# Patient Record
Sex: Female | Born: 1960 | Race: White | Hispanic: No | Marital: Married | Smoking: Never smoker
Health system: Southern US, Community
[De-identification: ages and names within clinical notes are randomized; demographics above are authoritative.]

## PROBLEM LIST (undated history)

## (undated) DIAGNOSIS — I82409 Acute embolism and thrombosis of unspecified deep veins of unspecified lower extremity: Secondary | ICD-10-CM

## (undated) DIAGNOSIS — F329 Major depressive disorder, single episode, unspecified: Secondary | ICD-10-CM

## (undated) DIAGNOSIS — E039 Hypothyroidism, unspecified: Secondary | ICD-10-CM

## (undated) DIAGNOSIS — K219 Gastro-esophageal reflux disease without esophagitis: Secondary | ICD-10-CM

## (undated) DIAGNOSIS — K760 Fatty (change of) liver, not elsewhere classified: Secondary | ICD-10-CM

## (undated) DIAGNOSIS — G4730 Sleep apnea, unspecified: Secondary | ICD-10-CM

## (undated) DIAGNOSIS — G629 Polyneuropathy, unspecified: Secondary | ICD-10-CM

## (undated) DIAGNOSIS — E119 Type 2 diabetes mellitus without complications: Secondary | ICD-10-CM

## (undated) DIAGNOSIS — D649 Anemia, unspecified: Secondary | ICD-10-CM

## (undated) DIAGNOSIS — R0600 Dyspnea, unspecified: Secondary | ICD-10-CM

## (undated) DIAGNOSIS — J189 Pneumonia, unspecified organism: Secondary | ICD-10-CM

## (undated) DIAGNOSIS — S52501A Unspecified fracture of the lower end of right radius, initial encounter for closed fracture: Secondary | ICD-10-CM

## (undated) DIAGNOSIS — E7800 Pure hypercholesterolemia, unspecified: Secondary | ICD-10-CM

## (undated) DIAGNOSIS — R011 Cardiac murmur, unspecified: Secondary | ICD-10-CM

## (undated) DIAGNOSIS — Z9889 Other specified postprocedural states: Secondary | ICD-10-CM

## (undated) DIAGNOSIS — F32A Depression, unspecified: Secondary | ICD-10-CM

## (undated) DIAGNOSIS — N289 Disorder of kidney and ureter, unspecified: Secondary | ICD-10-CM

## (undated) DIAGNOSIS — I10 Essential (primary) hypertension: Secondary | ICD-10-CM

## (undated) DIAGNOSIS — R112 Nausea with vomiting, unspecified: Secondary | ICD-10-CM

## (undated) DIAGNOSIS — M1990 Unspecified osteoarthritis, unspecified site: Secondary | ICD-10-CM

## (undated) DIAGNOSIS — K5900 Constipation, unspecified: Secondary | ICD-10-CM

## (undated) HISTORY — PX: KIDNEY TRANSPLANT: SHX239

## (undated) HISTORY — PX: ABDOMINAL HYSTERECTOMY: SHX81

## (undated) HISTORY — PX: COLONOSCOPY: SHX174

## (undated) HISTORY — PX: EYE SURGERY: SHX253

---

## 1997-11-23 ENCOUNTER — Ambulatory Visit (HOSPITAL_COMMUNITY): Admission: RE | Admit: 1997-11-23 | Discharge: 1997-11-23 | Payer: Self-pay | Admitting: Unknown Physician Specialty

## 1998-02-24 HISTORY — PX: KIDNEY TRANSPLANT: SHX239

## 1998-02-24 HISTORY — PX: CHOLECYSTECTOMY: SHX55

## 1998-04-27 ENCOUNTER — Encounter: Payer: Self-pay | Admitting: General Surgery

## 1998-04-28 ENCOUNTER — Inpatient Hospital Stay (HOSPITAL_COMMUNITY): Admission: RE | Admit: 1998-04-28 | Discharge: 1998-04-29 | Payer: Self-pay | Admitting: General Surgery

## 2001-11-02 ENCOUNTER — Encounter: Payer: Self-pay | Admitting: Nephrology

## 2001-11-02 ENCOUNTER — Encounter: Admission: RE | Admit: 2001-11-02 | Discharge: 2001-11-02 | Payer: Self-pay | Admitting: Nephrology

## 2005-04-25 ENCOUNTER — Ambulatory Visit (HOSPITAL_BASED_OUTPATIENT_CLINIC_OR_DEPARTMENT_OTHER): Admission: RE | Admit: 2005-04-25 | Discharge: 2005-04-25 | Payer: Self-pay | Admitting: Nephrology

## 2005-04-25 ENCOUNTER — Encounter: Payer: Self-pay | Admitting: Internal Medicine

## 2005-04-27 ENCOUNTER — Ambulatory Visit: Payer: Self-pay | Admitting: Nephrology

## 2005-06-24 ENCOUNTER — Ambulatory Visit: Payer: Self-pay | Admitting: Internal Medicine

## 2005-07-24 ENCOUNTER — Ambulatory Visit: Payer: Self-pay | Admitting: Internal Medicine

## 2005-12-18 ENCOUNTER — Ambulatory Visit: Payer: Self-pay | Admitting: Internal Medicine

## 2006-06-19 ENCOUNTER — Ambulatory Visit: Payer: Self-pay | Admitting: Internal Medicine

## 2007-12-22 ENCOUNTER — Telehealth (INDEPENDENT_AMBULATORY_CARE_PROVIDER_SITE_OTHER): Payer: Self-pay | Admitting: *Deleted

## 2008-05-29 DIAGNOSIS — G4733 Obstructive sleep apnea (adult) (pediatric): Secondary | ICD-10-CM

## 2008-05-29 DIAGNOSIS — Z940 Kidney transplant status: Secondary | ICD-10-CM

## 2008-05-30 ENCOUNTER — Ambulatory Visit: Payer: Self-pay | Admitting: Internal Medicine

## 2008-07-05 ENCOUNTER — Telehealth (INDEPENDENT_AMBULATORY_CARE_PROVIDER_SITE_OTHER): Payer: Self-pay | Admitting: *Deleted

## 2008-07-26 ENCOUNTER — Encounter: Payer: Self-pay | Admitting: Internal Medicine

## 2008-08-15 ENCOUNTER — Ambulatory Visit: Payer: Self-pay | Admitting: Internal Medicine

## 2008-09-03 ENCOUNTER — Telehealth: Payer: Self-pay | Admitting: Internal Medicine

## 2008-09-22 ENCOUNTER — Ambulatory Visit: Payer: Self-pay | Admitting: Internal Medicine

## 2008-10-10 ENCOUNTER — Encounter: Payer: Self-pay | Admitting: Internal Medicine

## 2008-11-13 ENCOUNTER — Encounter: Payer: Self-pay | Admitting: Internal Medicine

## 2009-04-18 ENCOUNTER — Ambulatory Visit: Payer: Self-pay | Admitting: Internal Medicine

## 2009-04-18 LAB — CONVERTED CEMR LAB: IgE (Immunoglobulin E), Serum: <1.5 intl units/mL (ref 0.0–180.0)

## 2009-05-18 ENCOUNTER — Ambulatory Visit: Payer: Self-pay | Admitting: Internal Medicine

## 2009-08-16 ENCOUNTER — Ambulatory Visit: Payer: Self-pay | Admitting: Internal Medicine

## 2009-08-29 ENCOUNTER — Telehealth (INDEPENDENT_AMBULATORY_CARE_PROVIDER_SITE_OTHER): Payer: Self-pay | Admitting: *Deleted

## 2010-02-13 ENCOUNTER — Ambulatory Visit: Payer: Self-pay | Admitting: Internal Medicine

## 2010-02-21 ENCOUNTER — Telehealth (INDEPENDENT_AMBULATORY_CARE_PROVIDER_SITE_OTHER): Payer: Self-pay | Admitting: *Deleted

## 2010-03-26 NOTE — Assessment & Plan Note (Signed)
Summary: 3 months/apc   Primary Provider/Referring Provider:  Roney Marion  CC:  3 month follow up visit-allergies; itchy ears x 6-7 weeks.Valerie Perez  History of Present Illness: April 18, 2009- OSA, allergic conjunctivitis, hx kidney transplant Says eye and nose are worse if anything. Some blood and watery drainage, especially in AMs. Ears pop. Hearing not changed. Itching but little sneeze. Not seasonal. CPAP is "fine' and she doesn't feel it bothers her nose. Pressure is now 12 and she likes this DME better. She hadn't been using the humidifier on her cpap. Says she still feels somewhat tired or no energy.  May 18, 2009- OSA, allergic rhinoconjunctivitis, hx kidney transplant Likes Astepro in addition to eye drops and loratadine. Some nausea today attributed to her transplant meds, but breathing has been good, with minor watering of eyes.  Sleep- says she is sleeping ok but complains of not much energy. Good compiance and control with CPAP at 12 used every night.   August 16, 2009- OSA, allergic rhinoconjunctivitis, hx kidney transplant C/O itching in eras, itching watery nose x 6-7 weeks. Uses Astepro, loratadine, [place] eyedrops. Chest feels fine. Continues immunosuppression for transplant with Rapamune and prednisone. Contnues cpap all night every night and says it helps.. used Nuvigil 1/4 tab if needed.   Preventive Screening-Counseling & Management  Alcohol-Tobacco     Smoking Status: never     Tobacco Counseling: not indicated; no tobacco use  Current Medications (verified): 1)  Rapamune 1 Mg Tabs (Sirolimus) .... 5 Once Daily 2)  Prednisone 5 Mg Tabs (Prednisone) .... Take As Directed 3)  Zetia 10 Mg Tabs (Ezetimibe) .Valerie Perez.. 1 Once Daily 4)  Actos 30 Mg Tabs (Pioglitazone Hcl) .Valerie Perez.. 1 Once Daily 5)  Lipitor 40 Mg Tabs (Atorvastatin Calcium) .Valerie Perez.. 1 Once Daily 6)  Paroxetine Hcl 20 Mg Tabs (Paroxetine Hcl) .Valerie Perez.. 1 Once Daily 7)  Colchicine 0.6 Mg Tabs (Colchicine) .Valerie Perez.. 1 Once  Daily 8)  Allopurinol 300 Mg Tabs (Allopurinol) .... Take 1 By Mouth Once Daily 9)  Loratadine 10 Mg Tabs (Loratadine) .... Take 1 By Mouth Once Daily As Needed 10)  Eye Drops Relief 0.05-0.25 % Soln (Tetrahydrozoline-Zn Sulfate) .... Use As Directed 11)  Cpap 12 [facility] Pt 12)  Fluticasone Propionate 50 Mcg/act Susp (Fluticasone Propionate) .Valerie Perez.. 1-2 Puffs Each Nostril Once Daily 13)  Nuvigil 150 Mg Tabs (Armodafinil) .Valerie Perez.. 1 Daily As Needed 14)  Astepro 0.15 % Soln (Azelastine Hcl) .Valerie Perez.. 1-2 Puffs Each Nostil Up To Two Times A Day As Needed  Allergies (verified): 1)  ! Jonne Ply  Past History:  Past Surgical History: Last updated: 06-01-2008 Renal transplant [facility] Cholecystectomy abd hernia repair heel spurs right foot  Family History: Last updated: 01-Jun-2008 Father- died thyroid cancer Mother- died colon cancer  Social History: Last updated: 06-01-2008 Patient states former smoker. 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, engineering  Risk Factors: Smoking Status: never (08/16/2009)  Past Medical History: UNSPECIFIED CONJUNCTIVITIS (ICD-372.30) KIDNEY TRANSPLANTATION, HX OF (ICD-V42.0) HYPERSOMNIA (ICD-780.54) OBSTRUCTIVE SLEEP APNEA (ICD-327.23)  Social History: Smoking Status:  never  Review of Systems      See HPI  The patient denies shortness of breath with activity, shortness of breath at rest, productive cough, non-productive cough, coughing up blood, chest pain, irregular heartbeats, acid heartburn, indigestion, loss of appetite, weight change, abdominal pain, difficulty swallowing, sore throat, tooth/dental problems, headaches, nasal congestion/difficulty breathing through nose, and sneezing.    Vital Signs:  Patient profile:  50 year old female Height:      65.5 inches Weight:      230 pounds BMI:     37.83 O2 Sat:      99 % on Room air Pulse rate:   75 / minute BP sitting:   126 / 80  (left arm) Cuff size:    regular  Vitals Entered By: Reynaldo Minium CMA (August 16, 2009 10:53 AM)  O2 Flow:  Room air CC: 3 month follow up visit-allergies; itchy ears x 6-7 weeks. Comments BP taken on wrist with regular cuff.Reynaldo Minium CMA  August 16, 2009 10:53 AM    Physical Exam  Additional Exam:  General: A/Ox3; pleasant and cooperative, NAD, overweight, calm/ passive SKIN: no rash, lesions NODES: no lymphadenopathy HEENT: [place]/AT, EOM- WNL, canals may show mnimal trace eczema, Conjuctivae- clear, PERRLA, TM-WNL, Nose- watery clear mucus. The mucosa shows superficial anterior bleeding without deep erosion, Throat- clear and wnl, Mallampatii III NECK: Supple w/ fair ROM, JVD- none, normal carotid impulses w/o bruits Thyroid- normal to palpation CHEST: Clear to P&A HEART: RRR, no m/g/r heard ABDOMEN:  YQI:HKVQ, nl pulses, no edema  NEURO: Grossly intact to observation      Impression & Recommendations:  Problem # 1:  ALLERGIC RHINITIS (ICD-477.9)  Control is good in her nose. For allergy or eczema related itching in auditory canals, she will try sweet oil. We will give sample patanase to compare with Astepro.  Her updated medication list for this problem includes:    Loratadine 10 Mg Tabs (Loratadine) .Valerie Perez... Take 1 by mouth once daily as needed    Fluticasone Propionate 50 Mcg/act Susp (Fluticasone propionate) .Valerie Perez... 1-2 puffs each nostril once daily    Astepro 0.15 % Soln (Azelastine hcl) .Valerie Perez... 1-2 puffs each nostil up to two times a day as needed  Problem # 2:  OBSTRUCTIVE SLEEP APNEA (ICD-327.23)  Continues good compiance and control with CPAP. Weight loss would help.  Problem # 3:  UNSPECIFIED CONJUNCTIVITIS (ICD-372.30) Inflammation is not evident ofn exam. We discussed possible over drying.  Other Orders: Est. Patient Level III (25956)  Patient Instructions: 1)  Please schedule a follow-up appointment in 6 months- earlier if needed 2)  Try a little sweet oil when ears itch. 3)  Try sample  Patanase : 1-2 puffs into each nostril twice daily as needed. Compare this to Astepro as an alternative. See if either one makes any difference with the itching in your ears.

## 2010-03-26 NOTE — Progress Notes (Signed)
Summary: rx  Phone Note Call from Patient Call back at Home Phone [phone]   Caller: Patient Call For: young Reason for Call: Talk to Nurse Summary of Call: [facility] - [place] Please call in script for nasal spray - Tatanase Initial call taken by: Eugene Gavia,  August 29, 2009 4:01 PM  Follow-up for Phone Call        nasal spray sent to [facility] in  Follow-up by: Philipp Deputy CMA,  August 29, 2009 4:08 PM    New/Updated Medications: PATANASE 0.6 % SOLN (OLOPATADINE HCL) 1-2 puffs into each nostril twice a day as needed Prescriptions: PATANASE 0.6 % SOLN (OLOPATADINE HCL) 1-2 puffs into each nostril twice a day as needed  #1 x 3   Entered by:   Philipp Deputy CMA   Authorized by:   Waymon Budge MD   Signed by:   Philipp Deputy CMA on 08/29/2009   Method used:   Electronically to        [facility]. [phone]* (retail)       [address]       [place], [place]  [address]       Ph: [phone] or [phone]       Fax: [phone]   RxID:   [id number]

## 2010-03-26 NOTE — Assessment & Plan Note (Signed)
Summary: 1 month/apc   Primary Provider/Referring Provider:  Roney Marion  CC:  1 month follow up visit-Astepro really helps. .  History of Present Illness: 08/15/08- OSA, allergic conjunctivitis With CPAP notes she sleeps all night, but feels persistent tiredness- less than before, and has more dream recall. Naps help. Avoids cafeine. HS 11PM, up 8-9 AM. Sleeps through the night.  Rhinitis- likes veramyst sample along with her loratadine. We can give script for generic.  09/21/08- OSA, allergic conjunctivitis, hx Kidney transplant Eyes and nasal drainage worse this summer, even indoors. She is using fluticasone, lotratadine, Relief eyedrops, and remains on prednisone for her renal transplant. OSA- DRS never changed her cpap down from 14 to 12 and they apparently are too far away from her. Mask discomfort may improve.  April 18, 2009- OSA, allergic conjunctivitis, hx kidney transplant Says eye and nose are worse if anything. Some blood and watery drainage, especially in AMs. Ears pop. Hearing not changed. Itching but little sneeze. Not seasonal. CPAP is "fine' and she doesn't feel it bothers her nose. Pressure is now 12 and she likes this DME better. She hadn't been using the humidifier on her cpap. Says she still feels somewhat tired or no energy.  May 18, 2009- OSA, allergic rhinoconjunctivitis, hx kidney transplant Likes Astepro in addition to eye drops and loratadine. Some nausea today attributed to her transplant meds, but breathing has been good, with minor watering of eyes.  Sleep- says she is sleeping ok but complains of not much energy. Good compiance and control with CPAP at 12 used every night.   Current Medications (verified): 1)  Rapamune 1 Mg Tabs (Sirolimus) .... 5 Once Daily 2)  Prednisone 5 Mg Tabs (Prednisone) .... Take As Directed 3)  Zetia 10 Mg Tabs (Ezetimibe) .Marland Kitchen.. 1 Once Daily 4)  Actos 30 Mg Tabs (Pioglitazone Hcl) .Marland Kitchen.. 1 Once Daily 5)  Lipitor 40 Mg Tabs  (Atorvastatin Calcium) .Marland Kitchen.. 1 Once Daily 6)  Paroxetine Hcl 20 Mg Tabs (Paroxetine Hcl) .Marland Kitchen.. 1 Once Daily 7)  Colchicine 0.6 Mg Tabs (Colchicine) .Marland Kitchen.. 1 Once Daily 8)  Allopurinol 300 Mg Tabs (Allopurinol) .... Take 1 By Mouth Once Daily 9)  Loratadine 10 Mg Tabs (Loratadine) .... Take 1 By Mouth Once Daily As Needed 10)  Eye Drops Relief 0.05-0.25 % Soln (Tetrahydrozoline-Zn Sulfate) .... Use As Directed 11)  Cpap 12 [facility] Pt 12)  Fluticasone Propionate 50 Mcg/act Susp (Fluticasone Propionate) .Marland Kitchen.. 1-2 Puffs Each Nostril Once Daily  Allergies (verified): 1)  ! Jonne Ply  Past History:  Past Medical History: Last updated: 05/29/2008 Current Problems:  UNSPECIFIED CONJUNCTIVITIS (ICD-372.30) KIDNEY TRANSPLANTATION, HX OF (ICD-V42.0) HYPERSOMNIA (ICD-780.54) OBSTRUCTIVE SLEEP APNEA (ICD-327.23)  Past Surgical History: Last updated: 16-Jun-2008 Renal transplant [facility] Cholecystectomy abd hernia repair heel spurs right foot  Family History: Last updated: 2008/06/16 Father- died thyroid cancer Mother- died colon cancer  Social History: Last updated: 06/16/2008 Patient states former smoker. 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         Watery eyes Nausea  Vital Signs:  Patient profile:   50 year old female Height:      65.5 inches Weight:  238.38 pounds BMI:     39.21 O2 Sat:      98 % on Room air Pulse rate:   68 / minute BP sitting:   110 / 72  (left arm) Cuff size:   large  Vitals Entered By: Reynaldo Minium CMA (May 18, 2009 10:53 AM)  O2 Flow:  Room air  Physical Exam  Additional Exam:   General: A/Ox3; pleasant and cooperative, NAD, overweight, calm/ passive SKIN: no rash, lesions NODES: no lymphadenopathy HEENT: [place]/AT, EOM- WNL, Conjuctivae- clear, PERRLA, TM-WNL, Nose- watery clear mucus. The mucosa shows superficial anterior bleeding without deep erosion, Throat- clear and wnl, Melampatti III NECK: Supple w/ fair ROM, JVD- none, normal carotid impulses w/o bruits Thyroid- normal to palpation CHEST: Clear to P&A HEART: RRR, no m/g/r heard ABDOMEN:  ZOX:WRUE, nl pulses, no edema  NEURO: Grossly intact to observation      Impression & Recommendations:  Problem # 1:  ALLERGIC RHINITIS (ICD-477.9)  Good control Her updated medication list for this problem includes:    Loratadine 10 Mg Tabs (Loratadine) .Marland Kitchen... Take 1 by mouth once daily as needed    Fluticasone Propionate 50 Mcg/act Susp (Fluticasone propionate) .Marland Kitchen... 1-2 puffs each nostril once daily    Astepro 0.15 % Soln (Azelastine hcl) .Marland Kitchen... 1-2 puffs each nostil up to two times a day as needed  Problem # 2:  UNSPECIFIED CONJUNCTIVITIS (ICD-372.30)  Mild allergic conjunctivitis  Problem # 3:  OBSTRUCTIVE SLEEP APNEA (ICD-327.23)  Residual hypersomnia with good cpap function. We discussed and will try Nuvigil. This will have less tendency to raise BP which we want t avoid with her renal transplant. She understands that adequate sleep remains important.  Orders: Est. Patient Level III (45409)  Medications Added to Medication List This Visit: 1)  Nuvigil 150 Mg Tabs (Armodafinil) .Marland Kitchen.. 1 daily as needed 2)  Astepro 0.15 % Soln (Azelastine hcl) .Marland Kitchen.. 1-2 puffs each nostil up to two times a day as needed  Patient Instructions: 1)  Please schedule a follow-up appointment in 3 months. 2)  Script for Astepro nasal antihistamine spray 3)  Sample/ script/ discount card for Nuvigil 150 mg- try 1/2 or 1 tab in AM as needed for alertness. Prescriptions: ASTEPRO 0.15 % SOLN (AZELASTINE HCL) 1-2 puffs each nostil up to  two times a day as needed  #3 x 3   Entered and Authorized by:   Waymon Budge MD   Signed by:   Waymon Budge MD on 05/18/2009   Method used:   Print then Give to Patient   RxID:   [id number] NUVIGIL 150 MG TABS (ARMODAFINIL) 1 daily as needed  #90 x 3   Entered and Authorized by:   Waymon Budge MD   Signed by:   Waymon Budge MD on 05/18/2009   Method used:   Print then Give to Patient   RxID:   [id number]

## 2010-03-26 NOTE — Assessment & Plan Note (Signed)
Summary: f/u 6 months///kp   Primary Provider/Referring Provider:  Roney Marion  CC:  6 month follow up visit-allergies acting up "really bad".Still fatigued since last visit-not getting any better. Marland Kitchen  History of Present Illness: 2008/06/25- OSA, allergic conjunctivitis Eyes itch, nose itches and burns.Got worse with Spring pollen.Uses cpap every night but she thought the humidifier caused a wet irritation on the left side of her nose so she stopped using the humidifier several months ago. Starting to get tired again. Gaining weight despite going regularly to gym. Renal transplant still functioning normally and she takes diuretic. No change in prednisone dose.  She started using otc ketotifen eye drops and loratadine. She can't afford co-pay for Rx eye drops   08/15/08- OSA, allergic conjunctivitis With CPAP notes she sleeps all night, but feels persistent tiredness- less than before, and has more dream recall. Naps help. Avoids cafeine. HS 11PM, up 8-9 AM. Sleeps through the night.  Rhinitis- likes veramyst sample along with her loratadine. We can give script for generic.  09/21/08- OSA, allergic conjunctivitis, hx Kidney transplant Eyes and nasal drainage worse this summer, even indoors. She is using fluticasone, lotratadine, Relief eyedrops, and remains on prednisone for her renal transplant. OSA- DRS never changed her cpap down from 14 to 12 and they apparently are too far away from her. Mask discomfort may improve.  April 18, 2009- OSA, allergic conjunctivitis, hx kidney transplant Says eye and nose are worse if anything. Some blood and watery drainage, especially in AMs. Ears pop. Hearing not changed. Itching but little sneeze. Not seasonal. CPAP is "fine' and she doesn't feel it bothers her nose. Pressure is now 12 and she likes this DME better. She hadn't been using the humidifier on her cpap. Says she still feels somewhat tired or no energy.  Current Medications (verified): 1)   Rapamune 1 Mg Tabs (Sirolimus) .... 5 Once Daily 2)  Prednisone 5 Mg Tabs (Prednisone) .... Take As Directed 3)  Zetia 10 Mg Tabs (Ezetimibe) .Marland Kitchen.. 1 Once Daily 4)  Actos 30 Mg Tabs (Pioglitazone Hcl) .Marland Kitchen.. 1 Once Daily 5)  Lipitor 40 Mg Tabs (Atorvastatin Calcium) .Marland Kitchen.. 1 Once Daily 6)  Paroxetine Hcl 20 Mg Tabs (Paroxetine Hcl) .Marland Kitchen.. 1 Once Daily 7)  Colchicine 0.6 Mg Tabs (Colchicine) .Marland Kitchen.. 1 Once Daily 8)  Allopurinol 300 Mg Tabs (Allopurinol) .... Take 1 By Mouth Once Daily 9)  Loratadine 10 Mg Tabs (Loratadine) .... Take 1 By Mouth Once Daily As Needed 10)  Eye Drops Relief 0.05-0.25 % Soln (Tetrahydrozoline-Zn Sulfate) .... Use As Directed 11)  Cpap 12 [facility] Pt 12)  Fluticasone Propionate 50 Mcg/act Susp (Fluticasone Propionate) .Marland Kitchen.. 1-2 Puffs Each Nostril Once Daily  Allergies (verified): 1)  ! Jonne Ply  Past History:  Past Medical History: Last updated: 05/29/2008 Current Problems:  UNSPECIFIED CONJUNCTIVITIS (ICD-372.30) KIDNEY TRANSPLANTATION, HX OF (ICD-V42.0) HYPERSOMNIA (ICD-780.54) OBSTRUCTIVE SLEEP APNEA (ICD-327.23)  Past Surgical History: Last updated: 06/25/08 Renal transplant [facility] Cholecystectomy abd hernia repair heel spurs right foot  Family History: Last updated: Jun 25, 2008 Father- died thyroid cancer Mother- died colon cancer  Social History: Last updated: 2008-06-25 Patient states former smoker. 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    Vital Signs:  Patient profile:   50 year old female Height:      65.5 inches Weight:      241.13 pounds O2 Sat:      97 % on Room air Pulse  rate:   66 / minute BP sitting:   130 / 80  (left arm) Cuff size:   regular  Vitals Entered By: Reynaldo Minium CMA (April 18, 2009 10:17 AM)  O2 Flow:  Room air  Physical Exam  Additional Exam:  General: A/Ox3; pleasant and cooperative, NAD, overweight, calm/ passive SKIN: no rash, lesions NODES: no lymphadenopathy HEENT: [place]/AT, EOM- WNL, Conjuctivae- clear, PERRLA, TM-WNL, Nose- watery clear mucus. The mucosa shows superficial anterior bleeding without deep erosion, Throat- clear and wnl, Melampatti III NECK: Supple w/ fair ROM, JVD- none, normal carotid impulses w/o bruits Thyroid- normal to palpation CHEST: Clear to P&A HEART: RRR, no m/g/r heard ABDOMEN:  ZOX:WRUE, nl pulses, no edema  NEURO: Grossly intact to observation      Impression & Recommendations:  Problem # 1:  OBSTRUCTIVE SLEEP APNEA (ICD-327.23)  Good control and compliance now at lowered pressure. Weight loss would help.  Problem # 2:  ALLERGIC RHINITIS (ICD-477.9)  We will send RAST to evaluate allergy possibility. We discussed possible rebound rhinorhea in response to overdrying at night. She will start using her cpap humidifer and add nasal gel. We will also let her try sample of Astepro if needed. Her updated medication list for this problem includes:    Loratadine 10 Mg Tabs (Loratadine) .Marland Kitchen... Take 1 by mouth once daily as needed    Fluticasone Propionate 50 Mcg/act Susp (Fluticasone propionate) .Marland Kitchen... 1-2 puffs each nostril once daily  Medications Added to Medication List This Visit: 1)  Cpap 12 [facility] Pt   Other Orders: Est. Patient Level III (45409) T-"RAST" (Allergy Full Profile) IGE ([id number])  Patient Instructions: 1)  Please schedule a follow-up appointment in 1 month. 2)  Try using the cpap humidifier- start at a lower setting. You can adjust for comfort. 3)  Try otc nasal saline gel in each nostril at bedtime. 4)  Sample Astepro nasal antihistamine spray: 1-2 puffs each  nostril up to two times a day as needed for watery nose. 5)  Lab

## 2010-03-28 NOTE — Assessment & Plan Note (Signed)
Summary: 6 months/apc   Primary Provider/Referring Provider:  Roney Marion  CC:  6 month follow up visit-sleep apnea; using CPAP each night but waking up with leaks from machine.Marland Kitchen  History of Present Illness: May 18, 2009- OSA, allergic rhinoconjunctivitis, hx kidney transplant Likes Astepro in addition to eye drops and loratadine. Some nausea today attributed to her transplant meds, but breathing has been good, with minor watering of eyes.  Sleep- says she is sleeping ok but complains of not much energy. Good compiance and control with CPAP at 12 used every night.   August 16, 2009- OSA, allergic rhinoconjunctivitis, hx kidney transplant C/O itching in eras, itching watery nose x 6-7 weeks. Uses Astepro, loratadine, [place] eyedrops. Chest feels fine. Continues immunosuppression for transplant with Rapamune and prednisone. Contnues cpap all night every night and says it helps.. used Nuvigil 1/4 tab if needed.  February 13, 2010- - OSA, allergic rhinoconjunctivitis, hx kidney transplant 6 month follow up visit-sleep apnea; using CPAP each night but waking up with leaks from machine. She got hoarse after inhaling dead leaves from a plant that tipped over 6 days ago.  Denies fever or productive cough, but felt itch in throat, got hoarse, and still feels a little funny in throat. Getting better on its own.  Using CPAP every night and it helps. Mask leaks more as she has lost a bit of weight. We discussed how to talk with her DME about mask fit.  Needs flu vax.     Preventive Screening-Counseling & Management  Alcohol-Tobacco     Smoking Status: never     Tobacco Counseling: not indicated; no tobacco use  Current Medications (verified): 1)  Rapamune 1 Mg Tabs (Sirolimus) .... 5 Once Daily 2)  Prednisone 5 Mg Tabs (Prednisone) .... Take As Directed 3)  Zetia 10 Mg Tabs (Ezetimibe) .Marland Kitchen.. 1 Once Daily 4)  Actos 30 Mg Tabs (Pioglitazone Hcl) .Marland Kitchen.. 1 Once Daily 5)  Lipitor 40 Mg Tabs  (Atorvastatin Calcium) .Marland Kitchen.. 1 Once Daily 6)  Paroxetine Hcl 20 Mg Tabs (Paroxetine Hcl) .Marland Kitchen.. 1 Once Daily 7)  Colchicine 0.6 Mg Tabs (Colchicine) .Marland Kitchen.. 1 Once Daily 8)  Allopurinol 300 Mg Tabs (Allopurinol) .... Take 1 By Mouth Once Daily 9)  Loratadine 10 Mg Tabs (Loratadine) .... Take 1 By Mouth Once Daily As Needed 10)  Eye Drops Relief 0.05-0.25 % Soln (Tetrahydrozoline-Zn Sulfate) .... Use As Directed 11)  Cpap 12 [facility] Pt 12)  Fluticasone Propionate 50 Mcg/act Susp (Fluticasone Propionate) .Marland Kitchen.. 1-2 Puffs Each Nostril Once Daily 13)  Nuvigil 150 Mg Tabs (Armodafinil) .Marland Kitchen.. 1 Daily As Needed 14)  Astepro 0.15 % Soln (Azelastine Hcl) .Marland Kitchen.. 1-2 Puffs Each Nostil Up To Two Times A Day As Needed 15)  Patanase 0.6 % Soln (Olopatadine Hcl) .Marland Kitchen.. 1-2 Puffs Into Each Nostril Twice A Day As Needed  Allergies (verified): 1)  ! Jonne Ply  Past History:  Past Medical History: Last updated: 08/16/2009 UNSPECIFIED CONJUNCTIVITIS (ICD-372.30) KIDNEY TRANSPLANTATION, HX OF (ICD-V42.0) HYPERSOMNIA (ICD-780.54) OBSTRUCTIVE SLEEP APNEA (ICD-327.23)  Past Surgical History: Last updated: 12-Jun-2008 Renal transplant T A [facility] and B [facility] O Cholecystectomy abd hernia repair heel spurs right foot  Family History: Last updated: 06-12-2008 Father- died thyroid cancer Mother- died colon cancer  Social History: Last updated: 2008-06-12 Patient states former smoker. 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  The patient denies anorexia, fever, weight gain, vision loss, decreased hearing, chest pain, syncope, dyspnea on exertion, peripheral edema, prolonged cough, headaches, hemoptysis, abdominal pain, severe indigestion/heartburn, unusual weight change, and enlarged lymph nodes.    Vital Signs:  Patient profile:   50 year old  female Height:      65.5 inches Weight:      200.50 pounds BMI:     32.98 O2 Sat:      96 % on Room air Pulse rate:   84 / minute BP sitting:   132 / 84  (left arm) Cuff size:   regular  Vitals Entered By: Reynaldo Minium CMA (February 13, 2010 11:17 AM)  O2 Flow:  Room air CC: 6 month follow up visit-sleep apnea; using CPAP each night but waking up with leaks from machine.   Physical Exam  Additional Exam:  General: A/Ox3; pleasant and cooperative, NAD, overweight, calm/ passive SKIN: no rash, lesions NODES: no lymphadenopathy HEENT: [place]/AT, EOM- WNL, canals may show mnimal trace eczema, Conjuctivae- clear, PERRLA, TM-WNL, Nose- clear, Throat- clear and wnl, Mallampatii III NECK: Supple w/ fair ROM, JVD- none, normal carotid impulses w/o bruits Thyroid- normal to palpation CHEST: Clear to P&A HEART: RRR, no m/g/r heard ABDOMEN: overweight ZOX:WRUE, nl pulses, no edema  NEURO: Grossly intact to observation      Impression & Recommendations:  Problem # 1:  ALLERGIC RHINITIS (ICD-477.9)  She had some kind of acute reaction to the plant exposure last week, but that has resolved. Her updated medication list for this problem includes:    Loratadine 10 Mg Tabs (Loratadine) .Marland Kitchen... Take 1 by mouth once daily as needed    Fluticasone Propionate 50 Mcg/act Susp (Fluticasone propionate) .Marland Kitchen... 1-2 puffs each nostril once daily    Astepro 0.15 % Soln (Azelastine hcl) .Marland Kitchen... 1-2 puffs each nostil up to two times a day as needed    Patanase 0.6 % Soln (Olopatadine hcl) .Marland Kitchen... 1-2 puffs into each nostril twice a day as needed  Orders: Est. Patient Level III (45409)  Problem # 2:  OBSTRUCTIVE SLEEP APNEA (ICD-327.23)  Good CPAP compliance and control . Sounds as if she needs a new mask. Also asks flu shot- discussed.   Medications Added to Medication List This Visit: 1)  Cpap Mask of Choice and Supplies   Other Orders: Flu Vaccine 12yrs + (81191) Admin 1st Vaccine (47829)  Patient  Instructions: 1)  Please schedule a follow-up appointment in 1 year. 2)  Continue CPAP at 12 3)  Script to show [facility] for a replacement CPAP mask 4)  Flu vax Prescriptions: CPAP MASK OF CHOICE AND SUPPLIES   #1 x prn   Entered and Authorized by:   Waymon Budge MD   Signed by:   Waymon Budge MD on 02/13/2010   Method used:   Print then Give to Patient   RxID:   [id number]    Immunizations Administered:  Influenza Vaccine # 1:    Vaccine Type: Fluvax 3+    Site: right deltoid    Mfr: GlaxoSmithKline    Dose: 0.5 ml    Route: IM    Given by: Reynaldo Minium CMA    Exp. Date: 08/24/2010    Lot #: [id number]    VIS given: 09/18/09 version given February 13, 2010.  Flu Vaccine Consent Questions:    Do you have a history of severe allergic reactions to this vaccine? no  Any prior history of allergic reactions to egg and/or gelatin? no    Do you have a sensitivity to the preservative Thimersol? no    Do you have a past history of Guillan-Barre Syndrome? no    Do you currently have an acute febrile illness? no    Have you ever had a severe reaction to latex? no    Vaccine information given and explained to patient? yes    Are you currently pregnant? no

## 2010-03-28 NOTE — Progress Notes (Signed)
Summary: prescription for astepro  Phone Note Call from Patient Call back at Home Phone [phone]   Caller: Patient Call For: dr. Maple Hudson Summary of Call: Pt is at the pharmacy to pick up her prescription for Patanase and her insurance is not approving this medication and it will cost her $140.00 pharamcy told her that she could get Cipraco in stead stated that the pharmacy [facility] in [place] is faxing [place] explaining about the insurance. Patient can be reached at home [phone]. Initial call taken by: Vedia Coffer,  February 21, 2010 2:09 PM  Follow-up for Phone Call        spoke with pt pharmacy and her insurance does not cover patanase, but it will cover Astepro. Pt wants to know if rx can be changed to astepro. Please advsie on directions and quanity. thanks. Carron Curie CMA  February 21, 2010 4:30 PM   per CY  ok to change to astepro same directions and quanity as patanase.Carron Curie CMA  February 21, 2010 4:40 PM   Additional Follow-up for Phone Call Additional follow up Details #1::        called and spoke with pt.  pt aware of change in med and rx sent to pharmacy.  Aundra Millet Reynolds LPN  February 21, 2010 4:44 PM     Prescriptions: ASTEPRO 0.15 % SOLN (AZELASTINE HCL) 1-2 puffs each nostil up to two times a day as needed  #1 x 3   Entered by:   Arman Filter LPN   Authorized by:   Waymon Budge MD   Signed by:   Arman Filter LPN on 47/82/9562   Method used:   Electronically to        [facility]. [phone] (retail)       [address]       [place], [place]  [address]       Ph: [phone] or [phone]       Fax: [phone]   RxID:   [id number]

## 2010-07-12 NOTE — Assessment & Plan Note (Signed)
[facility]                               PULMONARY OFFICE NOTE   Valerie Perez, Valerie Perez                    MRN:          [id number]  DATE:12/18/2005                            DOB:          [birth year]    PULMONARY/SLEEP FOLLOW UP.   PROBLEM:  1. Obstructive sleep apnea.  2. Hypersomnia.  3. Renal transplant.   HISTORY:  She is not happy with [facility] because they have not followed  very closely with her, but apparently she has not gone after them either.  Her CPAP mask is not fitting well but she thinks the pressure of 13 is okay  and she feels better on waking then she does without it.  There is some  bothersome post nasal drip which has been present all summer but no sinus  pain.   MEDICATION:  1. Rapamune 5 tabs daily.  2. Prednisone 7.5 mg daily.  3. Zetia 10 mg.  4. Actos 30 mg.  5. Lipitor 40 mg.  6. Labetalol 200 mg x2 b.i.d.  7. Omeprazole.  8. Gynodiol 1 mg.  9. Paroxetine 20 mg.  10.Colchicine 0.6 mg.  11.Furosemide 40 mg.  12.CPAP at 13 CWP.  13.Optivar eye drops.   DRUG INTOLERANT OF ASPIRIN.   OBJECTIVE:  Weight 231 pounds, BP 110/68, pulse regular 75, room air  saturation 97%.  She is obese and rather passive.  There are no pressure  marks on her face from the mask and no evident nasal congestion.  Her  breathing is unlabored, pulse is regular.   IMPRESSION:  Obstructive sleep apnea with some issues apparently of mask  fit.  Rhinitis is not an active concern now.   PLAN:  1. She is going to call [facility] and work with them on the mask.  2. We will help her change to a different home care company if she wishes,      preferably      something closer to her home in [place].  3. Try Claritin for postnasal drip.  4. Try saline nasal gel p.r.n. for comfort.  5. Schedule return in 6 months, earlier p.r.n.     Clinton D. Maple Hudson, MD, FCCP, FACP    CDY/MedQ  DD: 12/19/2005  DT: 12/22/2005  Job #: [id number]

## 2010-07-12 NOTE — Procedures (Signed)
NAME:  Valerie Perez, Valerie Perez NO.:  [id number]   MEDICAL RECORD NO.:  [id number]          PATIENT TYPE:  OUT   LOCATION:  SLEEP CENTER                 FACILITY:  [facility]   PHYSICIAN:  Clinton D. Maple Hudson, M.D. DATE OF BIRTH:  Oct 27, 1960   DATE OF STUDY:  04/25/2005                              NOCTURNAL POLYSOMNOGRAM   REFERRING PHYSICIAN:  Dr. Aram Beecham B. Eliott Nine.   DATE OF STUDY:  April 25, 2005.   INDICATION FOR STUDY:  Hypersomnia with sleep apnea.   EPWORTH SLEEPINESS SCORE:  14/24.   BMI:  36.6.   WEIGHT:  220 pounds.   HOME MEDICATIONS:  1.  Rapamune.  2.  Prednisone.  3.  Zetia.  4.  Actos.  5.  Lipitor.  6.  Labetalol.  7.  Omeprazole.  8.  Gynodiol.  9.  Paxil.  10. Colchicine.  11. Furosemide.   SLEEP ARCHITECTURE:  Total sleep time 398 minutes with sleep efficiency 90%.  Stage I was 5%, stage II 45%, stages III and IV 15%, REM 35% of total sleep  time, indicating some rebound. Sleep latency 11 minutes, REM latency 87  minutes, awake after sleep onset 32 minutes, arousal index increased at 47.3  indicating fragmentation.   RESPIRATORY DATA:  Split study protocol. Apnea/hypopnea index (AHI, RDI)  51.8 obstructive events per hour indicating moderately severe obstructive  sleep apnea/hypopnea syndrome before C-PAP. This included 46 obstructive  apneas, 5 central apneas and 100 hypopneas before C-PAP. Most events were  recorded while supine (AHI of 48.5 per hour). REM AHI 65 per hour. C-PAP was  titrated to 12 CWP, AHI 1.4 per hour. A small ResMed Ultra Mirage nasal/oral  mask was used with heated humidifier.   OXYGEN DATA:  Moderate snoring with oxygen desaturation to a nadir of 73%  before C-PAP. After C-PAP control, saturation held 94-96% on room air.   CARDIAC DATA:  Normal sinus rhythm.   MOVEMENT/PARASOMNIAS:  A total of 42 limb jerks were recorded of which 15  were associated with arousal or awakening for a periodic limb movement with  arousal  index of 2.3 per hour which is mildly increased but of doubtful  significance.   IMPRESSION/RECOMMENDATIONS:  1.  Moderately severe obstructive sleep apnea/hypopnea syndrome, AHI 51.8      per hour with most events recorded while supine or in REM. Moderate      snoring with oxygen desaturation to a nadir of 73%.  2.  Successful C-PAP titration to 12 CWP, AHI 1.4 per hour. A small ResMed      Ultra Mirage nasal/oral mask was used with a heated humidifier.  3.  Periodic limb movement with arousal, 2.3 per hour. This is of doubtful      significance unless complaints were noted after the patient is adapted      to C-PAP at home.      Clinton D. Maple Hudson, M.D.  Diplomate, Biomedical engineer of Sleep Medicine  Electronically Signed     CDY/MEDQ  D:  04/27/2005 11:28:34  T:  04/28/2005 10:49:23  Job:  [id number]

## 2010-07-12 NOTE — Assessment & Plan Note (Signed)
[facility]                             PULMONARY OFFICE NOTE   Valerie Perez, Valerie Perez                    MRN:          [id number]  DATE:06/19/2006                            DOB:          03-08-1960    PROBLEMS:  1. Obstructive sleep apnea.  2. Hypersomnia.  3. Renal transplant.  4. Allergic conjunctivitis.   HISTORY:  She chose to stay with [facility].  Mask fits better now.  CPAP at 14 CWP has been comfortable.  Observes that right nostril  usually a little more narrow than the left but not a problem.  Pollen is  making her eyes burn and water.   MEDICATIONS:  1. Rapamune 5 tabs daily.  2. Prednisone 7.5 mg daily.  3. Zetia 10 mg.  4. Actos 30 mg.  5. Lipitor 40 mg.  6. Labetalol 200 mg x2 b.i.d.  7. Omeprazole.  8. Gynodiol 1 mg.  9. Paroxetine 20 mg.  10.Colchicine 0.6 mg.  11.Furosemide 40 mg.  12.CPAP 14 CWP.  13.Vitamin B6.  14.Lovaza.  15.She is out of Optivar eye drops, using Claritin p.r.n.   Drug intolerant to ASPIRIN.   OBJECTIVE:  VITAL SIGNS:  Weight 235 pounds.  BP 132/80, pulse 67, room  air saturation 96%.  HEENT:  Nasal mucosa is somewhat reddened with some clear mucus  bridging.  No polyps.  Pharynx is clear.  Conjunctivae are not obviously  injected.  Secretions are clear.  LUNGS:  Clear to P&A.  CARDIAC:  Heart sounds normal.   IMPRESSION:  1. Obstructive sleep apnea, adequately controlled, now on CPAP at 14      CWP.  2. Seasonal allergic conjunctivitis and rhinitis.   PLAN:  1. Continue CPAP at 14.  2. Try Pataday 1 drop each eye daily.  3. Schedule return in six months, earlier p.r.n.     Clinton D. Maple Hudson, MD, Tonny Bollman, FACP  Electronically Signed    CDY/MedQ  DD: 06/19/2006  DT: 06/19/2006  Job #: [id number]

## 2011-02-13 ENCOUNTER — Ambulatory Visit: Payer: Self-pay | Admitting: Internal Medicine

## 2013-03-28 ENCOUNTER — Telehealth: Payer: Self-pay | Admitting: Internal Medicine

## 2013-03-28 NOTE — Telephone Encounter (Signed)
This sounds more like a pinched nerve or neuropathy. I had seen her for sleep apnea for which she may need f/u.  For this complaint I would have her first see her PCP with consideration of neurology evaluation if needed.

## 2013-03-28 NOTE — Telephone Encounter (Signed)
Spoke to pt. Reports having issues with her legs at night time. States that she has shooting pains down her legs to her feet. Numbness and tingling are present. Onset was 6 months ago. Would like CY's recs. Has not been seen since 2011. Advised that she might have to come in for a visit.  Allergies  Allergen Reactions  . Aspirin     No current outpatient prescriptions on file prior to visit.   No current facility-administered medications on file prior to visit.    CY - please advise. Thanks.

## 2013-03-28 NOTE — Telephone Encounter (Signed)
Called pt. Made aware of recs. Nothing further needed 

## 2013-03-30 ENCOUNTER — Telehealth: Payer: Self-pay | Admitting: Internal Medicine

## 2013-03-30 NOTE — Telephone Encounter (Signed)
Pt decided not to leave a message.  Holly D Pryor ° °

## 2015-01-25 ENCOUNTER — Encounter (HOSPITAL_COMMUNITY): Payer: Self-pay

## 2015-04-28 ENCOUNTER — Inpatient Hospital Stay (HOSPITAL_COMMUNITY)
Admission: EM | Admit: 2015-04-28 | Discharge: 2015-04-30 | DRG: 919 | Disposition: A | Discharge status: Home / Self Care | Payer: Commercial Managed Care - PPO | Attending: Internal Medicine | Admitting: Internal Medicine

## 2015-04-28 ENCOUNTER — Encounter (HOSPITAL_COMMUNITY): Payer: Self-pay | Admitting: Emergency Medicine

## 2015-04-28 ENCOUNTER — Emergency Department (HOSPITAL_COMMUNITY): Payer: Commercial Managed Care - PPO

## 2015-04-28 DIAGNOSIS — F329 Major depressive disorder, single episode, unspecified: Secondary | ICD-10-CM | POA: Diagnosis present

## 2015-04-28 DIAGNOSIS — Z992 Dependence on renal dialysis: Secondary | ICD-10-CM

## 2015-04-28 DIAGNOSIS — E039 Hypothyroidism, unspecified: Secondary | ICD-10-CM | POA: Diagnosis present

## 2015-04-28 DIAGNOSIS — D638 Anemia in other chronic diseases classified elsewhere: Secondary | ICD-10-CM | POA: Diagnosis present

## 2015-04-28 DIAGNOSIS — D849 Immunodeficiency, unspecified: Secondary | ICD-10-CM | POA: Diagnosis present

## 2015-04-28 DIAGNOSIS — N186 End stage renal disease: Secondary | ICD-10-CM

## 2015-04-28 DIAGNOSIS — K659 Peritonitis, unspecified: Secondary | ICD-10-CM | POA: Diagnosis present

## 2015-04-28 DIAGNOSIS — N2581 Secondary hyperparathyroidism of renal origin: Secondary | ICD-10-CM | POA: Diagnosis present

## 2015-04-28 DIAGNOSIS — E1122 Type 2 diabetes mellitus with diabetic chronic kidney disease: Secondary | ICD-10-CM | POA: Diagnosis present

## 2015-04-28 DIAGNOSIS — R509 Fever, unspecified: Secondary | ICD-10-CM | POA: Diagnosis present

## 2015-04-28 DIAGNOSIS — Z79899 Other long term (current) drug therapy: Secondary | ICD-10-CM

## 2015-04-28 DIAGNOSIS — D899 Disorder involving the immune mechanism, unspecified: Secondary | ICD-10-CM

## 2015-04-28 DIAGNOSIS — T8571XA Infection and inflammatory reaction due to peritoneal dialysis catheter, initial encounter: Secondary | ICD-10-CM | POA: Diagnosis present

## 2015-04-28 DIAGNOSIS — Z90710 Acquired absence of both cervix and uterus: Secondary | ICD-10-CM

## 2015-04-28 DIAGNOSIS — Y848 Other medical procedures as the cause of abnormal reaction of the patient, or of later complication, without mention of misadventure at the time of the procedure: Secondary | ICD-10-CM | POA: Diagnosis present

## 2015-04-28 DIAGNOSIS — E7800 Pure hypercholesterolemia, unspecified: Secondary | ICD-10-CM | POA: Diagnosis present

## 2015-04-28 DIAGNOSIS — J302 Other seasonal allergic rhinitis: Secondary | ICD-10-CM | POA: Diagnosis present

## 2015-04-28 DIAGNOSIS — G4733 Obstructive sleep apnea (adult) (pediatric): Secondary | ICD-10-CM | POA: Diagnosis present

## 2015-04-28 DIAGNOSIS — Z794 Long term (current) use of insulin: Secondary | ICD-10-CM | POA: Diagnosis not present

## 2015-04-28 DIAGNOSIS — Z940 Kidney transplant status: Secondary | ICD-10-CM

## 2015-04-28 DIAGNOSIS — B9689 Other specified bacterial agents as the cause of diseases classified elsewhere: Secondary | ICD-10-CM | POA: Diagnosis not present

## 2015-04-28 DIAGNOSIS — I120 Hypertensive chronic kidney disease with stage 5 chronic kidney disease or end stage renal disease: Secondary | ICD-10-CM | POA: Diagnosis present

## 2015-04-28 DIAGNOSIS — I10 Essential (primary) hypertension: Secondary | ICD-10-CM | POA: Diagnosis present

## 2015-04-28 HISTORY — DX: Major depressive disorder, single episode, unspecified: F32.9

## 2015-04-28 HISTORY — DX: Hypothyroidism, unspecified: E03.9

## 2015-04-28 HISTORY — DX: Pure hypercholesterolemia, unspecified: E78.00

## 2015-04-28 HISTORY — DX: Essential (primary) hypertension: I10

## 2015-04-28 HISTORY — DX: Disorder of kidney and ureter, unspecified: N28.9

## 2015-04-28 HISTORY — DX: Depression, unspecified: F32.A

## 2015-04-28 HISTORY — DX: Type 2 diabetes mellitus without complications: E11.9

## 2015-04-28 LAB — CBC WITH DIFFERENTIAL/PLATELET
Basophils Absolute: 0.1 10*3/uL (ref 0.0–0.1)
Basophils Relative: 1 %
Eosinophils Absolute: 0.1 10*3/uL (ref 0.0–0.7)
Eosinophils Relative: 3 %
HEMATOCRIT: 32.8 % — AB (ref 36.0–46.0)
HEMOGLOBIN: 10.1 g/dL — AB (ref 12.0–15.0)
LYMPHS ABS: 1.5 10*3/uL (ref 0.7–4.0)
LYMPHS PCT: 40 %
MCH: 28.8 pg (ref 26.0–34.0)
MCHC: 30.8 g/dL (ref 30.0–36.0)
MCV: 93.4 fL (ref 78.0–100.0)
MONOS PCT: 19 %
Monocytes Absolute: 0.7 10*3/uL (ref 0.1–1.0)
NEUTROS ABS: 1.4 10*3/uL — AB (ref 1.7–7.7)
NEUTROS PCT: 37 %
Platelets: 320 10*3/uL (ref 150–400)
RBC: 3.51 MIL/uL — AB (ref 3.87–5.11)
RDW: 15.8 % — ABNORMAL HIGH (ref 11.5–15.5)
WBC: 3.7 10*3/uL — AB (ref 4.0–10.5)

## 2015-04-28 LAB — URINALYSIS, ROUTINE W REFLEX MICROSCOPIC
BILIRUBIN URINE: NEGATIVE
GLUCOSE, UA: 250 mg/dL — AB
Ketones, ur: NEGATIVE mg/dL
Leukocytes, UA: NEGATIVE
Nitrite: NEGATIVE
PH: 8.5 — AB (ref 5.0–8.0)
Protein, ur: >300 mg/dL — AB
SPECIFIC GRAVITY, URINE: 1.01 (ref 1.005–1.030)

## 2015-04-28 LAB — COMPREHENSIVE METABOLIC PANEL
ALBUMIN: 3.3 g/dL — AB (ref 3.5–5.0)
ALT: 15 U/L (ref 14–54)
ANION GAP: 14 (ref 5–15)
AST: 30 U/L (ref 15–41)
Alkaline Phosphatase: 100 U/L (ref 38–126)
BUN: 12 mg/dL (ref 6–20)
CALCIUM: 8.9 mg/dL (ref 8.9–10.3)
CHLORIDE: 96 mmol/L — AB (ref 101–111)
CO2: 28 mmol/L (ref 22–32)
Creatinine, Ser: 2.8 mg/dL — ABNORMAL HIGH (ref 0.44–1.00)
GFR calc non Af Amer: 18 mL/min — ABNORMAL LOW (ref >60)
GFR, EST AFRICAN AMERICAN: 21 mL/min — AB (ref >60)
GLUCOSE: 147 mg/dL — AB (ref 65–99)
Potassium: 4.1 mmol/L (ref 3.5–5.1)
SODIUM: 138 mmol/L (ref 135–145)
Total Bilirubin: 0.8 mg/dL (ref 0.3–1.2)
Total Protein: 6.2 g/dL — ABNORMAL LOW (ref 6.5–8.1)

## 2015-04-28 LAB — URINE MICROSCOPIC-ADD ON

## 2015-04-28 LAB — I-STAT CG4 LACTIC ACID, ED: Lactic Acid, Venous: 1.21 mmol/L (ref 0.5–2.0)

## 2015-04-28 LAB — GLUCOSE, CAPILLARY: Glucose-Capillary: 109 mg/dL — ABNORMAL HIGH (ref 65–99)

## 2015-04-28 MED ORDER — LORATADINE 10 MG PO TABS
10.0000 mg | ORAL_TABLET | Freq: Every morning | ORAL | Status: DC
  Administered 2015-04-29 – 2015-04-30 (×2): 10 mg via ORAL
  Filled 2015-04-28 (×2): qty 1

## 2015-04-28 MED ORDER — ACETAMINOPHEN 325 MG PO TABS
650.0000 mg | ORAL_TABLET | Freq: Once | ORAL | Status: AC
  Administered 2015-04-28: 650 mg via ORAL
  Filled 2015-04-28: qty 2

## 2015-04-28 MED ORDER — CALCITRIOL 0.25 MCG PO CAPS
0.2500 ug | ORAL_CAPSULE | Freq: Every day | ORAL | Status: DC
  Administered 2015-04-29 – 2015-04-30 (×2): 0.25 ug via ORAL
  Filled 2015-04-28 (×2): qty 1

## 2015-04-28 MED ORDER — HEPARIN 1000 UNIT/ML FOR PERITONEAL DIALYSIS
1500.0000 [IU] | INTRAMUSCULAR | Status: DC | PRN
  Administered 2015-04-29: 1500 [IU] via INTRAPERITONEAL
  Filled 2015-04-28 (×2): qty 1.5

## 2015-04-28 MED ORDER — HEPARIN SODIUM (PORCINE) 5000 UNIT/ML IJ SOLN
5000.0000 [IU] | Freq: Three times a day (TID) | INTRAMUSCULAR | Status: DC
  Administered 2015-04-28 – 2015-04-30 (×5): 5000 [IU] via SUBCUTANEOUS
  Filled 2015-04-28 (×3): qty 1

## 2015-04-28 MED ORDER — GABAPENTIN 300 MG PO CAPS
300.0000 mg | ORAL_CAPSULE | Freq: Every day | ORAL | Status: DC
  Administered 2015-04-28 – 2015-04-29 (×2): 300 mg via ORAL
  Filled 2015-04-28 (×2): qty 1

## 2015-04-28 MED ORDER — CYCLOSPORINE MODIFIED (NEORAL) 25 MG PO CAPS: 25.0000 mg | ORAL_CAPSULE | Freq: Two times a day (BID) | ORAL | Status: DC

## 2015-04-28 MED ORDER — AMLODIPINE BESYLATE 10 MG PO TABS: 10.0000 mg | ORAL_TABLET | Freq: Every day | ORAL | Status: DC

## 2015-04-28 MED ORDER — PROMETHAZINE HCL 25 MG/ML IJ SOLN
25.0000 mg | Freq: Once | INTRAMUSCULAR | Status: AC
  Administered 2015-04-28: 25 mg via INTRAVENOUS
  Filled 2015-04-28: qty 1

## 2015-04-28 MED ORDER — CYCLOSPORINE MODIFIED (NEORAL) 100 MG PO CAPS
200.0000 mg | ORAL_CAPSULE | Freq: Two times a day (BID) | ORAL | Status: DC
  Filled 2015-04-28: qty 2

## 2015-04-28 MED ORDER — SEVELAMER CARBONATE 800 MG PO TABS
800.0000 mg | ORAL_TABLET | Freq: Two times a day (BID) | ORAL | Status: DC
  Administered 2015-04-29 – 2015-04-30 (×3): 800 mg via ORAL
  Filled 2015-04-28 (×2): qty 1

## 2015-04-28 MED ORDER — ACETAMINOPHEN 325 MG PO TABS: 650.0000 mg | ORAL_TABLET | Freq: Once | ORAL | Status: DC | PRN

## 2015-04-28 MED ORDER — CYCLOSPORINE MODIFIED (NEORAL) 25 MG PO CAPS
225.0000 mg | ORAL_CAPSULE | Freq: Two times a day (BID) | ORAL | Status: DC
  Administered 2015-04-29 – 2015-04-30 (×4): 225 mg via ORAL
  Filled 2015-04-28 (×6): qty 9

## 2015-04-28 MED ORDER — PRAVASTATIN SODIUM 40 MG PO TABS
40.0000 mg | ORAL_TABLET | Freq: Every day | ORAL | Status: DC
  Administered 2015-04-28 – 2015-04-29 (×2): 40 mg via ORAL
  Filled 2015-04-28 (×2): qty 1

## 2015-04-28 MED ORDER — FLUTICASONE PROPIONATE 50 MCG/ACT NA SUSP
1.0000 | Freq: Every morning | NASAL | Status: DC
  Administered 2015-04-29 – 2015-04-30 (×2): 1 via NASAL
  Filled 2015-04-28 (×2): qty 16

## 2015-04-28 MED ORDER — DELFLEX-LC/1.5% DEXTROSE 346 MOSM/L IP SOLN
Freq: Once | INTRAPERITONEAL | Status: AC
  Administered 2015-04-29: 3000 mL via INTRAPERITONEAL

## 2015-04-28 MED ORDER — PREDNISONE 5 MG PO TABS
5.0000 mg | ORAL_TABLET | Freq: Every day | ORAL | Status: DC
  Administered 2015-04-29 – 2015-04-30 (×2): 5 mg via ORAL
  Filled 2015-04-28 (×2): qty 1

## 2015-04-28 MED ORDER — ACETAMINOPHEN 650 MG RE SUPP: 650.0000 mg | Freq: Four times a day (QID) | RECTAL | Status: DC | PRN

## 2015-04-28 MED ORDER — DULOXETINE HCL 60 MG PO CPEP
60.0000 mg | ORAL_CAPSULE | Freq: Every day | ORAL | Status: DC
  Administered 2015-04-28 – 2015-04-29 (×2): 60 mg via ORAL
  Filled 2015-04-28 (×2): qty 1

## 2015-04-28 MED ORDER — MYCOPHENOLATE SODIUM 180 MG PO TBEC
540.0000 mg | DELAYED_RELEASE_TABLET | Freq: Two times a day (BID) | ORAL | Status: DC
  Administered 2015-04-28 – 2015-04-30 (×4): 540 mg via ORAL
  Filled 2015-04-28 (×4): qty 3

## 2015-04-28 MED ORDER — BUPROPION HCL ER (XL) 150 MG PO TB24
150.0000 mg | ORAL_TABLET | Freq: Every day | ORAL | Status: DC
  Administered 2015-04-28 – 2015-04-29 (×2): 150 mg via ORAL
  Filled 2015-04-28 (×2): qty 1

## 2015-04-28 MED ORDER — LEVOTHYROXINE SODIUM 75 MCG PO TABS
75.0000 ug | ORAL_TABLET | Freq: Every day | ORAL | Status: DC
  Administered 2015-04-29 – 2015-04-30 (×2): 75 ug via ORAL
  Filled 2015-04-28 (×2): qty 1

## 2015-04-28 MED ORDER — INSULIN GLARGINE 100 UNIT/ML ~~LOC~~ SOLN
35.0000 [IU] | Freq: Two times a day (BID) | SUBCUTANEOUS | Status: DC
  Administered 2015-04-28 – 2015-04-30 (×4): 35 [IU] via SUBCUTANEOUS
  Filled 2015-04-28 (×5): qty 0.35

## 2015-04-28 MED ORDER — AMLODIPINE BESYLATE 10 MG PO TABS
10.0000 mg | ORAL_TABLET | Freq: Every day | ORAL | Status: DC
  Administered 2015-04-28 – 2015-04-30 (×3): 10 mg via ORAL
  Filled 2015-04-28 (×3): qty 1

## 2015-04-28 MED ORDER — INSULIN ASPART 100 UNIT/ML ~~LOC~~ SOLN
10.0000 [IU] | Freq: Three times a day (TID) | SUBCUTANEOUS | Status: DC
  Administered 2015-04-29 – 2015-04-30 (×2): 10 [IU] via SUBCUTANEOUS

## 2015-04-28 MED ORDER — MONTELUKAST SODIUM 10 MG PO TABS
10.0000 mg | ORAL_TABLET | Freq: Every day | ORAL | Status: DC
  Administered 2015-04-28 – 2015-04-29 (×2): 10 mg via ORAL
  Filled 2015-04-28 (×2): qty 1

## 2015-04-28 MED ORDER — PIPERACILLIN-TAZOBACTAM IN DEX 2-0.25 GM/50ML IV SOLN
2.2500 g | Freq: Three times a day (TID) | INTRAVENOUS | Status: DC
  Administered 2015-04-29 – 2015-04-30 (×5): 2.25 g via INTRAVENOUS
  Filled 2015-04-28 (×7): qty 50

## 2015-04-28 NOTE — Progress Notes (Signed)
Pharmacy Antibiotic Note  Valerie Perez is a 55 y.o. female admitted on 04/28/2015 with fever in HD patient.  Pharmacy has been consulted for Zosyn dosing.  Pt has ESRD on HD Tues/Thur/Sat, pt also has PD catheter in place but not receiving PD at this time  Plan: -Zosyn 2.25g IV q8h -Trend WBC, temp, HD schedule   Height: 5\' 5"  (165.1 cm) Weight: 241 lb 9 oz (109.572 kg) IBW/kg (Calculated) : 57  Temp (24hrs), Avg:100.8 F (38.2 C), Min:99.8 F (37.7 C), Max:101.9 F (38.8 C)   Recent Labs Lab 04/28/15 1930 04/28/15 1937  WBC 3.7*  --   CREATININE 2.80*  --   LATICACIDVEN  --  1.21    Estimated Creatinine Clearance: 27.6 mL/min (by C-G formula based on Cr of 2.8).    Allergies  Allergen Reactions  . Tape Other (See Comments)    Tears skin  . 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    Abran DukeLedford, Sunday Klos 04/28/2015 11:53 PM

## 2015-04-28 NOTE — ED Provider Notes (Signed)
CSN: [id number]     Arrival date & time 04/28/15  1736 History   First MD Initiated Contact with Patient 04/28/15 1851     Chief Complaint  Patient presents with  . Fever     (Consider location/radiation/quality/duration/timing/severity/associated sxs/prior Treatment) HPI  55 year old female with ESRD presents with fever. Started 2 days ago. Temp of 100.7 at dialysis, given antibiotics. Felt subjective fever and chills yesterday. Today had temp of 99.9 at dialysis, given more antibiotics and sent here. Had a chest dialysis cath that has not been hurting. Has a PD catheter in abdomen that has not been used yet except for flushing. Dialysis took cultures yesterday. Sent here from dialysis. Has had a dry cough, no dyspnea. No congestion. Diarrhea x 1. Headache when she has fever, considers it mild. No neck stiffness.  Past Medical History  Diagnosis Date  . Renal disorder   . Hypertension   . Diabetes mellitus without complication (HCC)   . Depression   . Hypothyroid   . High cholesterol    Past Surgical History  Procedure Laterality Date  . Kidney transplant    . Abdominal hysterectomy     No family history on file. Social History  Substance Use Topics  . Smoking status: Never Smoker   . Smokeless tobacco: None  . Alcohol Use: No   OB History    No data available     Review of Systems  Constitutional: Positive for fever and chills.  Eyes: Negative for visual disturbance.  Respiratory: Positive for cough. Negative for shortness of breath.   Gastrointestinal: Positive for nausea. Negative for vomiting and abdominal pain.  Genitourinary: Negative for dysuria.  Neurological: Positive for headaches.  All other systems reviewed and are negative.     Allergies  Aspirin  Home Medications   Prior to Admission medications   Not on File   BP 196/74 mmHg  Pulse 73  Temp(Src) 99.8 F (37.7 C) (Oral)  Resp 16  Ht  (1.651 m)  Wt 241 lb 9 oz (109.572 kg)  BMI 40.20  kg/m2  SpO2 95% Physical Exam  Constitutional: She is oriented to person, place, and time. She appears well-developed and well-nourished.  HENT:  Head: Normocephalic and atraumatic.  Right Ear: External ear normal.  Left Ear: External ear normal.  Nose: Nose normal.  Eyes: Right eye exhibits no discharge. Left eye exhibits no discharge.  Neck: Normal range of motion. Neck supple.  Cardiovascular: Normal rate and regular rhythm.   Murmur heard. Pulmonary/Chest: Effort normal and breath sounds normal.  Chest catheter in place, no surrounding tenderness  Abdominal: Soft. She exhibits no distension. There is no tenderness.  PD catheter in place. No redness, swelling or tenderness  Neurological: She is alert and oriented to person, place, and time.  Skin: Skin is warm and dry.  Nursing note and vitals reviewed.   ED Course  Procedures (including critical care time) Labs Review Labs Reviewed  COMPREHENSIVE METABOLIC PANEL - Abnormal; Notable for the following:    Chloride 96 (*)    Glucose, Bld 147 (*)    Creatinine, Ser 2.80 (*)    Total Protein 6.2 (*)    Albumin 3.3 (*)    GFR calc non Af Amer 18 (*)    GFR calc Af Amer 21 (*)    All other components within normal limits  CBC WITH DIFFERENTIAL/PLATELET - Abnormal; Notable for the following:    WBC 3.7 (*)    RBC 3.51 (*)  Hemoglobin 10.1 (*)    HCT 32.8 (*)    RDW 15.8 (*)    Neutro Abs 1.4 (*)    All other components within normal limits  URINALYSIS, ROUTINE W REFLEX MICROSCOPIC (NOT AT [facility]) - Abnormal; Notable for the following:    pH 8.5 (*)    Glucose, UA 250 (*)    Hgb urine dipstick SMALL (*)    Protein, ur >300 (*)    All other components within normal limits  URINE MICROSCOPIC-ADD ON - Abnormal; Notable for the following:    Squamous Epithelial / LPF 0-5 (*)    Bacteria, UA RARE (*)    All other components within normal limits  GLUCOSE, CAPILLARY - Abnormal; Notable for the following:    Glucose-Capillary  109 (*)    All other components within normal limits  CULTURE, BLOOD (ROUTINE X 2)  CULTURE, BLOOD (ROUTINE X 2)  URINE CULTURE  BODY FLUID CULTURE  MRSA PCR SCREENING  INFLUENZA PANEL BY PCR (TYPE A & B, H1N1)  BODY FLUID CELL COUNT WITH DIFFERENTIAL  I-STAT CG4 LACTIC ACID, ED    Imaging Review Dg Chest 2 View  04/28/2015  CLINICAL DATA:  Fever cough.  Dialysis 2 days ago. EXAM: CHEST  2 VIEW COMPARISON:  None. FINDINGS: Mild to moderate cardiomegaly is noted. Both lungs are clear. No evidence of pleural effusion. Dual-lumen central venous dialysis catheter are seen in appropriate position. IMPRESSION: Cardiomegaly.  No active lung disease. Electronically Signed   By: Myles RosenthalJohn  Stahl M.D.   On: 04/28/2015 19:53   I have personally reviewed and evaluated these images and lab results as part of my medical decision-making.   EKG Interpretation None      MDM   Final diagnoses:  Fever in adult    Patient has a fever of 101.9 rectal. Has some URI symptoms but no obvious pneumonia. D/w Dr. Hyman HopesWebb of renal, recommends admission given multiple indwelling lines. PD nurse to come draw more peritoneal fluid. Admit to internal medicine teaching service. No signs of sepsis.    Pricilla LovelessScott Marlin Jarrard, MD 04/29/15 [phone]

## 2015-04-28 NOTE — ED Notes (Signed)
MD at bedside. 

## 2015-04-28 NOTE — ED Notes (Signed)
Pt reports had dialysis on Thursday and was found to have a fever. Pt was given IV antibiotics at that time. Today pt had dialysis and had fever again. Pt was given IV antibiotics and told to come to ED.

## 2015-04-28 NOTE — Progress Notes (Signed)
Placed patient on CPAP for the night via auto-mode with minimum pressure set at 4cm and maximum pressure set at 20cm.  

## 2015-04-28 NOTE — H&P (Signed)
Date: 04/28/2015               Patient Name:  Valerie Perez MRN: [id number]  DOB: 12-06-1960 Age / Sex: 55 y.o., female   PCP: Pcp Not In System         Medical Service: Internal Medicine Teaching Service         Attending Physician: Dr. Inez Catalina, MD    First Contact: Dr. Ladona Ridgel Pager: [phone]  Second Contact: Dr. Isabella Bowens Pager: [phone]       After Hours (After 5p/  First Contact Pager: [phone]  weekends / holidays): Second Contact Pager: [phone]   Chief Complaint: fevers  History of Present Illness: [name] is a 55 y.o. 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  Patient was in usual state of health until this Thursday when she began having chills while at HD.  Temperature was taken and noted to be 100.7.  She was given antibiotics intravenously, but she is unsure which ones.  She also thinks blood cultures were taken before getting antibiotics.  She received Tylenol and defervesced at this time as well.  Other symptoms on Thursday included a "tickle in her throat" that lasted until Friday, congestion, body aches, dry cough, decreased appetite.  States on Friday she "felt fair" with one episode of watery, loose stool.  Today, she went to dialysis and was noted to have temperature of 99.9.  She was given antibiotics again and sent to the emergency department.  In the ED, her temperature was noted to be 101.9.  She reports another loose bowel movement today described as watery and had some nausea.  She reports mild headache with fever.  No neck stiffness or confusion.  No chest pain, dyspnea, sputum production, abdominal pain, dysuria, vomiting.  She has a PD catheter in abdomen since mid-February and chest dialysis catheter replaced February 13.  She denies any recent travel, exposure to anything new.  She  reports husband was sick with "a cold" two weeks ago and she watches her 18 year old grand son twice per week.  In the ED here, she had a urinalysis that did not indicate infection, CXR with no acute process.  CBC revealed a WBC of 3.7 (ANC 1.4), H/H 10.1/32.8, MCV 93.4.  Repeat blood cultures were obtained, lactic acid was normal, influenza PCR obtained, and cultures were obtained from catheter site.  PMHx: DM, HTN, HLD, ESRD s/p transplant chronically rejected on HD, OSA, depression, hypothyroidism  FHx: mother died of colon cancer age 20, father died of thyroid cancer age 29  Surgical Hx: kidney transplant (2000), hysterectomy 2003  Social Hx: disabled since 2012 but used to work as Designer, jewellery, denies tobacco, EtOH, or elicit drugs.  Married, lives in [place], [place].  Goes to [facility].  Meds: Current Facility-Administered Medications  Medication Dose Route Frequency Provider Last Rate Last Dose  . acetaminophen (TYLENOL) tablet 650 mg  650 mg Oral Once PRN Ejiroghene Wendall Stade, MD       Or  . acetaminophen (TYLENOL) suppository 650 mg  650 mg Rectal Q6H PRN Ejiroghene E Emokpae, MD      . amLODipine (NORVASC) tablet 10 mg  10 mg Oral Daily Ejiroghene E Emokpae, MD      . buPROPion (WELLBUTRIN XL) 24 hr tablet 150 mg  150 mg Oral QHS Ejiroghene Wendall StadeE Emokpae, MD      . Melene Muller[START ON 04/29/2015] calcitRIOL (ROCALTROL) capsule 0.25 mcg  0.25 mcg Oral Daily Ejiroghene E Emokpae, MD      . cycloSPORINE modified (NEORAL) capsule 200 mg  200 mg Oral BID Ejiroghene E Emokpae, MD      . DULoxetine (CYMBALTA) DR capsule 60 mg  60 mg Oral QHS Ejiroghene E Emokpae, MD      . Melene Muller[START ON 04/29/2015] fluticasone (FLONASE) 50 MCG/ACT nasal spray 1 spray  1 spray Each Nare q morning - 10a Ejiroghene E Emokpae, MD      . gabapentin (NEURONTIN) capsule 300 mg  300 mg Oral QHS Ejiroghene E Emokpae, MD      . heparin injection 5,000 Units  5,000 Units Subcutaneous 3 times per day Ejiroghene Wendall StadeE Emokpae,  MD      . Melene Muller[START ON 04/29/2015] insulin aspart (novoLOG) injection 10 Units  10 Units Subcutaneous TID WC Ejiroghene E Emokpae, MD      . insulin glargine (LANTUS) injection 35 Units  35 Units Subcutaneous BID Ejiroghene Wendall StadeE Emokpae, MD      . Melene Muller[START ON 04/29/2015] levothyroxine (SYNTHROID, LEVOTHROID) tablet 75 mcg  75 mcg Oral QAC breakfast Ejiroghene Wendall StadeE Emokpae, MD      . Melene Muller[START ON 04/29/2015] loratadine (CLARITIN) tablet 10 mg  10 mg Oral q morning - 10a Ejiroghene E Emokpae, MD      . montelukast (SINGULAIR) tablet 10 mg  10 mg Oral QHS Ejiroghene E Emokpae, MD      . mycophenolate (MYFORTIC) EC tablet 540 mg  540 mg Oral BID Ejiroghene E Emokpae, MD      . pravastatin (PRAVACHOL) tablet 40 mg  40 mg Oral QHS Ejiroghene E Mariea ClontsEmokpae, MD      . Melene Muller[START ON 04/29/2015] predniSONE (DELTASONE) tablet 5 mg  5 mg Oral Q breakfast Ejiroghene E Emokpae, MD      . Melene Muller[START ON 04/29/2015] sevelamer carbonate (RENVELA) tablet 800 mg  800 mg Oral BID WC Ejiroghene Wendall StadeE Emokpae, MD        Allergies: Allergies as of 04/28/2015 - Review Complete 04/28/2015  Allergen Reaction Noted  . Tape Other (See Comments) 04/28/2015  . Aspirin Other (See Comments)    Past Medical History  Diagnosis Date  . Renal disorder   . Hypertension   . Diabetes mellitus without complication (HCC)   . Depression   . Hypothyroid   . High cholesterol    Past Surgical History  Procedure Laterality Date  . Kidney transplant    . Abdominal hysterectomy     No family history on file. Social History   Social History  . Marital Status: Married    Spouse Name: N/A  . Number of Children: N/A  . Years of Education: N/A   Occupational History  . Not on file.   Social History Main Topics  . Smoking status: Never Smoker   . Smokeless tobacco: Not on file  . Alcohol Use: No  . Drug Use: No  . Sexual Activity: Not on file   Other Topics Concern  . Not on file   Social History Narrative  . No narrative on file    Review of  Systems: Pertinent items are noted in HPI.  Physical Exam: Blood pressure 169/69, pulse 77, temperature 101.5 F (38.6 C), temperature source Oral, resp. rate 19, height 5\' 5"  (1.651 m), weight 241 lb 9 oz (109.572 kg), SpO2 95 %. Physical Exam  Constitutional:  She is oriented to person, place, and time. She appears well-developed and well-nourished. No distress.  HENT:  Head: Normocephalic and atraumatic.  Mouth/Throat: Oropharynx is clear and moist. No oropharyngeal exudate.  Eyes: Conjunctivae and EOM are normal.  Neck: Neck supple.  Cardiovascular: Normal rate and regular rhythm.   Murmur (2/6 systolic murmur best heard at right upper sternal border) heard. Chest HD catheter in place with no surrounding tenderness  Pulmonary/Chest: Effort normal and breath sounds normal. She has no wheezes. She has no rales.  Abdominal: Soft. She exhibits no distension. There is no tenderness.  Abdomen is obese with PD catheter in place with no tenderness or erythema.  Musculoskeletal: Normal range of motion. She exhibits no edema.  Lymphadenopathy:    She has no cervical adenopathy.  Neurological: She is alert and oriented to person, place, and time. No cranial nerve deficit.  Skin: Skin is warm and dry. No rash noted.  Psychiatric: She has a normal mood and affect.    Lab results: Basic Metabolic Panel:  Recent Labs  16/10/96 1930  NA 138  K 4.1  CL 96*  CO2 28  GLUCOSE 147*  BUN 12  CREATININE 2.80*  CALCIUM 8.9   Liver Function Tests:  Recent Labs  04/28/15 1930  AST 30  ALT 15  ALKPHOS 100  BILITOT 0.8  PROT 6.2*  ALBUMIN 3.3*   CBC:  Recent Labs  04/28/15 1930  WBC 3.7*  NEUTROABS 1.4*  HGB 10.1*  HCT 32.8*  MCV 93.4  PLT 320   Urinalysis:  Recent Labs  04/28/15 2025  COLORURINE YELLOW  LABSPEC 1.010  PHURINE 8.5*  GLUCOSEU 250*  HGBUR SMALL*  BILIRUBINUR NEGATIVE  KETONESUR NEGATIVE  PROTEINUR >300*  NITRITE NEGATIVE  LEUKOCYTESUR NEGATIVE     Imaging results:  Dg Chest 2 View  04/28/2015  CLINICAL DATA:  Fever cough.  Dialysis 2 days ago. EXAM: CHEST  2 VIEW COMPARISON:  None. FINDINGS: Mild to moderate cardiomegaly is noted. Both lungs are clear. No evidence of pleural effusion. Dual-lumen central venous dialysis catheter are seen in appropriate position. IMPRESSION: Cardiomegaly.  No active lung disease. Electronically Signed   By: Myles Rosenthal M.D.   On: 04/28/2015 19:53    Other results: EKG: none  Assessment & Plan by Problem: Active Problems:   Obstructive sleep apnea   KIDNEY TRANSPLANTATION, HX OF   Fever in adult   HTN (hypertension)   Diabetes (HCC)   Hypothyroidism   Immunosuppression (HCC)   ESRD on dialysis [facility])  55 y.o. 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    Fevers:  Patient presenting with a 3 day history of febrile illness while at hemodialysis and subsequently received two courses of antibiotics.  Associated with sore throat, dry cough, congestion, body aches.  Reports sick contacts at home recently with husband.  Patient reports receiving flu shot this year.  Symptoms seem consistent with a viral-like illness, however, patient is at risk for bloodstream infections since she is a hemodialysis patient.  Currently, patient is non-toxic appearing, hemodynamically stable, and not in distress. - piperacillin-tazobactam per pharmacy to cover GNR's - consider adding vancomycin.  Patient at risk for MRSA infection but did not want to add on currently in case she received after HD today - follow up influenza PCR - consider contacting dialysis center to  determine antibiotics received, possible results of blood cultures - follow up blood cultures here - follow up body fluid cultures from PD catheter - follow fever curve - acetaminophen for  fevers  ESRD on HD: gets dialysis on T, Th, Sat.  Has a PD catheter in her abdomen that has only been used for flushing.  Otherwise has an HD catheter in Right chest wall.  Patient had transplant in 2000 but is chronically rejecting her kidney transplant and started back on HD in 2016.  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  She is still rejecting her kidney and on immunosuppressant therapy (cyclosporine, mycophenolate, prednisone).  Primary nephrologist is Dr. Eliott Nine. - consult nephrology for HD - continue immunosuppresants  Diabetes Mellitus Type 2: home meds are Lantus 45units BID, Humalog sliding scale TID, gabapentin - consider A1c - Lantus 35 units BID - Novolog 10 units TID - continue gabapentin  Normocytic Anemia: hemoglobin 10.1 on admission, likely anemia chronic disease - consider anemia panel - continue monitoring  HLD: home meds are pravastatin 40mg  QHS - continue home meds  HTN:  Home meds include amlodipine and labetalol - restarted amlodipine - holding labetalol  Depression: home meds are duloxetine and buproprion - continue home meds  OSA: patient reports compliance with CPAP - continue CPAP  Hypothyroidism:  Home meds are levothyroxine  - continue home meds  Seasonal Allergies: home meds include loratidine10mg  daily, Singulair, and fluticasone daily - continue home meds  FEN - Fluids: none - Electrolytes: monitor - Nutrition: heart healthy/carb modified  DVT PPx: Heparin  CODE: FULL  Dispo: Disposition is deferred at this time, awaiting improvement of current medical problems. Anticipated discharge in approximately 2-3 day(s).   The patient does not have a current PCP (Pcp Not In System) and does need an [facility] hospital follow-up appointment after discharge.  The patient does not have transportation limitations that hinder transportation to clinic appointments.  Signed: Gwynn Burly, DO 04/28/2015, 10:43 PM

## 2015-04-28 NOTE — ED Notes (Signed)
Patient transported to X-ray 

## 2015-04-29 DIAGNOSIS — N186 End stage renal disease: Secondary | ICD-10-CM

## 2015-04-29 DIAGNOSIS — J302 Other seasonal allergic rhinitis: Secondary | ICD-10-CM

## 2015-04-29 DIAGNOSIS — G4733 Obstructive sleep apnea (adult) (pediatric): Secondary | ICD-10-CM

## 2015-04-29 DIAGNOSIS — F329 Major depressive disorder, single episode, unspecified: Secondary | ICD-10-CM

## 2015-04-29 DIAGNOSIS — R509 Fever, unspecified: Secondary | ICD-10-CM

## 2015-04-29 DIAGNOSIS — T8612 Kidney transplant failure: Secondary | ICD-10-CM

## 2015-04-29 DIAGNOSIS — Z992 Dependence on renal dialysis: Secondary | ICD-10-CM

## 2015-04-29 DIAGNOSIS — Z794 Long term (current) use of insulin: Secondary | ICD-10-CM

## 2015-04-29 DIAGNOSIS — D649 Anemia, unspecified: Secondary | ICD-10-CM

## 2015-04-29 DIAGNOSIS — E785 Hyperlipidemia, unspecified: Secondary | ICD-10-CM

## 2015-04-29 DIAGNOSIS — I120 Hypertensive chronic kidney disease with stage 5 chronic kidney disease or end stage renal disease: Secondary | ICD-10-CM

## 2015-04-29 DIAGNOSIS — E1122 Type 2 diabetes mellitus with diabetic chronic kidney disease: Secondary | ICD-10-CM

## 2015-04-29 DIAGNOSIS — Z79899 Other long term (current) drug therapy: Secondary | ICD-10-CM

## 2015-04-29 DIAGNOSIS — E039 Hypothyroidism, unspecified: Secondary | ICD-10-CM

## 2015-04-29 DIAGNOSIS — Z9989 Dependence on other enabling machines and devices: Secondary | ICD-10-CM

## 2015-04-29 LAB — GLUCOSE, CAPILLARY
GLUCOSE-CAPILLARY: 88 mg/dL (ref 65–99)
GLUCOSE-CAPILLARY: 93 mg/dL (ref 65–99)
Glucose-Capillary: 130 mg/dL — ABNORMAL HIGH (ref 65–99)

## 2015-04-29 LAB — INFLUENZA PANEL BY PCR (TYPE A & B)
H1N1FLUPCR: NOT DETECTED
INFLBPCR: NEGATIVE
Influenza A By PCR: NEGATIVE

## 2015-04-29 LAB — BODY FLUID CELL COUNT WITH DIFFERENTIAL
EOS FL: 4 %
LYMPHS FL: 65 %
Monocyte-Macrophage-Serous Fluid: 28 % — ABNORMAL LOW (ref 50–90)
NEUTROPHIL FLUID: 3 % (ref 0–25)
WBC FLUID: 12 uL (ref 0–1000)

## 2015-04-29 LAB — VANCOMYCIN, RANDOM: VANCOMYCIN RM: 16 ug/mL

## 2015-04-29 LAB — MRSA PCR SCREENING: MRSA by PCR: NEGATIVE

## 2015-04-29 MED ORDER — ONDANSETRON HCL 4 MG PO TABS
4.0000 mg | ORAL_TABLET | Freq: Three times a day (TID) | ORAL | Status: DC | PRN
  Administered 2015-04-29: 4 mg via ORAL
  Filled 2015-04-29: qty 1

## 2015-04-29 MED ORDER — VANCOMYCIN HCL IN DEXTROSE 1-5 GM/200ML-% IV SOLN
1000.0000 mg | Freq: Once | INTRAVENOUS | Status: AC
  Administered 2015-04-29: 1000 mg via INTRAVENOUS
  Filled 2015-04-29: qty 200

## 2015-04-29 MED ORDER — VANCOMYCIN HCL IN DEXTROSE 1-5 GM/200ML-% IV SOLN: 1000.0000 mg | INTRAVENOUS | Status: DC

## 2015-04-29 MED ORDER — LABETALOL HCL 100 MG PO TABS
100.0000 mg | ORAL_TABLET | Freq: Two times a day (BID) | ORAL | Status: DC
  Administered 2015-04-29 – 2015-04-30 (×3): 100 mg via ORAL
  Filled 2015-04-29 (×3): qty 1

## 2015-04-29 NOTE — Progress Notes (Signed)
Utilization Review Completed.Marietta Sikkema T3/06/2015  

## 2015-04-29 NOTE — Consult Note (Signed)
Referring Provider: No ref. provider found Primary Care Physician:  Pcp Not In System Primary Nephrologist:  Dr. Hyman Hopes  Reason for Consultation:  Medical management of ESRD  Fever evaluation /s transplant and anemia of chronic disease  HPI: [name] is a 55 y.o. 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   Past Medical History  Diagnosis Date  . Renal disorder   . Hypertension   . Diabetes mellitus without complication (HCC)   . Depression   . Hypothyroid   . High cholesterol     Past Surgical History  Procedure Laterality Date  . Kidney transplant    . Abdominal hysterectomy      Prior to Admission medications   Medication Sig Start Date End Date Taking? Authorizing Provider  acetaminophen (TYLENOL) 500 MG tablet Take 500 mg by mouth every 6 (six) hours as needed for moderate pain.   Yes Historical Provider, MD  allopurinol (ZYLOPRIM) 300 MG tablet Take 300 mg by mouth daily.   Yes Historical Provider, MD  amLODipine (NORVASC) 5 MG tablet Take 10 mg by mouth daily.   Yes Historical Provider, MD  buPROPion (WELLBUTRIN XL) 150 MG 24 hr tablet Take 150 mg by mouth at bedtime.   Yes Historical Provider, MD  calcitRIOL (ROCALTROL) 0.25 MCG capsule Take 0.25 mcg by mouth daily.   Yes Historical Provider, MD  cycloSPORINE modified (NEORAL) 100 MG capsule Take 200 mg by mouth 2 (two) times daily.   Yes Historical Provider, MD  cycloSPORINE modified (NEORAL) 25 MG capsule Take 25 mg by mouth 2 (two) times daily.   Yes Historical Provider, MD  DULoxetine (CYMBALTA) 60 MG capsule Take 60 mg by mouth at bedtime.   Yes Historical Provider, MD  fluticasone (FLONASE) 50 MCG/ACT nasal spray Place 1 spray into both nostrils every morning.   Yes Historical Provider, MD  gabapentin (NEURONTIN) 100 MG capsule Take 300 mg by mouth  at bedtime.   Yes Historical Provider, MD  insulin glargine (LANTUS) 100 UNIT/ML injection Inject 45 Units into the skin 2 (two) times daily.   Yes Historical Provider, MD  insulin lispro (HUMALOG) 100 UNIT/ML injection Inject 12 Units into the skin 3 (three) times daily before meals. Sliding scale: 12 units with each meal. Add 1 unit every 25 point BG > 150. Ex: BG 175, add 1 unit = 13 unit; BG 200, add 2 unit = 14 unit.   Yes Historical Provider, MD  labetalol (NORMODYNE) 200 MG tablet Take 100 mg by mouth 2 (two) times daily.   Yes Historical Provider, MD  loratadine (CLARITIN) 10 MG tablet Take 10 mg by mouth every morning.   Yes Historical Provider, MD  montelukast (SINGULAIR) 10 MG tablet Take 10 mg by mouth at bedtime.   Yes Historical Provider, MD  mycophenolate (MYFORTIC) 180 MG EC tablet Take 540 mg by mouth 2 (two) times daily.   Yes Historical Provider, MD  polyethylene glycol (MIRALAX / GLYCOLAX) packet Take 17 g by mouth daily.   Yes Historical Provider, MD  pravastatin (PRAVACHOL) 40 MG tablet Take 40 mg by mouth at bedtime.   Yes Historical Provider, MD  predniSONE (DELTASONE) 5 MG tablet Take 5 mg by mouth daily with breakfast.   Yes Historical Provider, MD  sevelamer carbonate (RENVELA) 800 MG tablet Take 800 mg by mouth 2 (two)  times daily.   Yes Historical Provider, MD    Current Facility-Administered Medications  Medication Dose Route Frequency Provider Last Rate Last Dose  . acetaminophen (TYLENOL) tablet 650 mg  650 mg Oral Once PRN Ejiroghene Wendall StadeE Emokpae, MD       Or  . acetaminophen (TYLENOL) suppository 650 mg  650 mg Rectal Q6H PRN Ejiroghene E Emokpae, MD      . amLODipine (NORVASC) tablet 10 mg  10 mg Oral Daily Ejiroghene E Emokpae, MD   10 mg at 04/29/15 1120  . buPROPion (WELLBUTRIN XL) 24 hr tablet 150 mg  150 mg Oral QHS Ejiroghene E Mariea ClontsEmokpae, MD   150 mg at 04/28/15 2311  . calcitRIOL (ROCALTROL) capsule 0.25 mcg  0.25 mcg Oral Daily Ejiroghene E Emokpae, MD    0.25 mcg at 04/29/15 1120  . cycloSPORINE modified (NEORAL) capsule 225 mg  225 mg Oral BID Inez CatalinaEmily B Mullen, MD   225 mg at 04/29/15 1121  . DULoxetine (CYMBALTA) DR capsule 60 mg  60 mg Oral QHS Ejiroghene E Mariea ClontsEmokpae, MD   60 mg at 04/28/15 2311  . fluticasone (FLONASE) 50 MCG/ACT nasal spray 1 spray  1 spray Each Nare q morning - 10a Ejiroghene E Emokpae, MD      . gabapentin (NEURONTIN) capsule 300 mg  300 mg Oral QHS Ejiroghene E Emokpae, MD   300 mg at 04/28/15 2311  . heparin 1000 unit/ml injection 1,500 Units  1,500 Units Intraperitoneal PRN Elvis CoilMartin Ariyon Gerstenberger, MD   1,500 Units at 04/29/15 0104  . heparin injection 5,000 Units  5,000 Units Subcutaneous 3 times per day Onnie BoerEjiroghene E Emokpae, MD   5,000 Units at 04/29/15 0522  . insulin aspart (novoLOG) injection 10 Units  10 Units Subcutaneous TID WC Ejiroghene Wendall StadeE Emokpae, MD   10 Units at 04/29/15 0825  . insulin glargine (LANTUS) injection 35 Units  35 Units Subcutaneous BID Ejiroghene E Mariea ClontsEmokpae, MD   35 Units at 04/29/15 1118  . labetalol (NORMODYNE) tablet 100 mg  100 mg Oral BID Jana HalfNicholas A Taylor, MD   100 mg at 04/29/15 1120  . levothyroxine (SYNTHROID, LEVOTHROID) tablet 75 mcg  75 mcg Oral QAC breakfast Onnie BoerEjiroghene E Emokpae, MD   75 mcg at 04/29/15 0840  . loratadine (CLARITIN) tablet 10 mg  10 mg Oral q morning - 10a Ejiroghene E Emokpae, MD   10 mg at 04/29/15 1121  . montelukast (SINGULAIR) tablet 10 mg  10 mg Oral QHS Ejiroghene Wendall StadeE Emokpae, MD   10 mg at 04/28/15 2311  . mycophenolate (MYFORTIC) EC tablet 540 mg  540 mg Oral BID Ejiroghene E Mariea ClontsEmokpae, MD   540 mg at 04/29/15 1120  . ondansetron (ZOFRAN) tablet 4 mg  4 mg Oral Q8H PRN Jana HalfNicholas A Taylor, MD   4 mg at 04/29/15 1117  . piperacillin-tazobactam (ZOSYN) IVPB 2.25 g  2.25 g Intravenous 3 times per day Stevphen RochesterJames L Ledford, RPH   2.25 g at 04/29/15 0522  . pravastatin (PRAVACHOL) tablet 40 mg  40 mg Oral QHS Ejiroghene Wendall StadeE Emokpae, MD   40 mg at 04/28/15 2311  . predniSONE (DELTASONE) tablet 5  mg  5 mg Oral Q breakfast Ejiroghene Wendall StadeE Emokpae, MD   5 mg at 04/29/15 0840  . sevelamer carbonate (RENVELA) tablet 800 mg  800 mg Oral BID WC Ejiroghene E Emokpae, MD   800 mg at 04/29/15 0840    Allergies as of 04/28/2015 - Review Complete 04/28/2015  Allergen Reaction Noted  . Tape Other (  See Comments) 04/28/2015  . Aspirin Other (See Comments)     No family history on file.  Social History   Social History  . Marital Status: Married    Spouse Name: N/A  . Number of Children: N/A  . Years of Education: N/A   Occupational History  . Not on file.   Social History Main Topics  . Smoking status: Never Smoker   . Smokeless tobacco: Not on file  . Alcohol Use: No  . Drug Use: No  . Sexual Activity: Not on file   Other Topics Concern  . Not on file   Social History Narrative  . No narrative on file    Review of Systems: Gen: + fever, chills,no sweats, no anorexia, no fatigue, no weakness, no malaise, no weight loss, and no sleep disorder HEENT: No visual complaints, No history of Retinopathy. Normal external appearance No Epistaxis or Sore throat. No sinusitis.   CV: Denies chest pain, angina, palpitations, syncope, orthopnea, PND, peripheral edema, and claudication. Resp: Denies dyspnea at rest, dyspnea with exercise, cough, sputum, wheezing, coughing up blood, and pleurisy. GI: Denies vomiting blood, jaundice, and fecal incontinence.   Denies dysphagia or odynophagia. GU : Denies urinary burning, blood in urine, urinary frequency, urinary hesitancy, nocturnal urination, and urinary incontinence.  No renal calculi. MS: Denies joint pain, limitation of movement, and swelling, stiffness, low back pain, extremity pain. Denies muscle weakness, cramps, atrophy.  No use of non steroidal antiinflammatory drugs. Derm: Denies rash, itching, dry skin, hives, moles, warts, or unhealing ulcers.  Psych: Denies depression, anxiety, memory loss, suicidal ideation, hallucinations, paranoia,  and confusion. Heme: Denies bruising, bleeding, and enlarged lymph nodes. Neuro: No headache.  No diplopia. No dysarthria.  No dysphasia.  No history of CVA.  No Seizures. No paresthesias.  No weakness. Endocrin  DM.  No Thyroid disease.  No Adrenal disease.  Physical Exam: Vital signs in last 24 hours: Temp:  [98.7 F (37.1 C)-101.9 F (38.8 C)] 98.7 F (37.1 C) (03/05 0501) Pulse Rate:  [66-84] 66 (03/05 0501) Resp:  [16-19] 18 (03/05 0501) BP: (149-196)/(53-85) 149/69 mmHg (03/05 0501) SpO2:  [94 %-98 %] 98 % (03/05 0501) Weight:  [109.572 kg (241 lb 9 oz)] 109.572 kg (241 lb 9 oz) (03/04 1745) Last BM Date: 04/28/15 General:   Alert,  Well-developed, well-nourished, pleasant and cooperative in NAD Head:  Normocephalic and atraumatic. Eyes:  Sclera clear, no icterus.   Conjunctiva pink. Ears:  Normal auditory acuity. Nose:  No deformity, discharge,  or lesions. Mouth:  No deformity or lesions, dentition normal. Neck:  Supple; no masses or thyromegaly. JVP not elevated Lungs:  Clear throughout to auscultation.   No wheezes, crackles, or rhonchi. No acute distress. Heart:  Regular rate and rhythm; no murmurs, clicks, rubs,  or gallops. Abdomen:  Soft, nontender and nondistended. No masses, hepatosplenomegaly or hernias noted. Normal bowel sounds, without guarding, and without rebound.   Msk:  Symmetrical without gross deformities. Normal posture. Pulses:  No carotid, renal, femoral bruits. DP and PT symmetrical and equal Extremities:  Without clubbing or edema. Neurologic:  Alert and  oriented x4;  grossly normal neurologically. Skin:  Intact without significant lesions or rashes. Cervical Nodes:  No significant cervical adenopathy. Psych:  Alert and cooperative. Normal mood and affect.  Intake/Output from previous day: 03/04 0701 - 03/05 0700 In: 960 [P.O.:360; IV Piggyback:100] Out: 500  Intake/Output this shift:    Lab Results:  Recent Labs  04/28/15 1930  WBC 3.7*  HGB 10.1*  HCT 32.8*  PLT 320   BMET  Recent Labs  04/28/15 1930  NA 138  K 4.1  CL 96*  CO2 28  GLUCOSE 147*  BUN 12  CREATININE 2.80*  CALCIUM 8.9   LFT  Recent Labs  04/28/15 1930  PROT 6.2*  ALBUMIN 3.3*  AST 30  ALT 15  ALKPHOS 100  BILITOT 0.8   PT/INR No results for input(s): LABPROT, INR in the last 72 hours. Hepatitis Panel No results for input(s): HEPBSAG, HCVAB, HEPAIGM, HEPBIGM in the last 72 hours.  Studies/Results: Dg Chest 2 View  04/28/2015  CLINICAL DATA:  Fever cough.  Dialysis 2 days ago. EXAM: CHEST  2 VIEW COMPARISON:  None. FINDINGS: Mild to moderate cardiomegaly is noted. Both lungs are clear. No evidence of pleural effusion. Dual-lumen central venous dialysis catheter are seen in appropriate position. IMPRESSION: Cardiomegaly.  No active lung disease. Electronically Signed   By: Myles Rosenthal M.D.   On: 04/28/2015 19:53    Assessment/Plan:  Fever  + pan cultured and treated with broad spectrum antibiotics. Conventional microbes are considered including bacterial and viral pathogens. Patient is also immunosuppressed and therefore consideration needs to be given to CMV viremia , EBV and BKVirues infection. Atypical mycobacterium, nocardiasis , actinomycosis, cryptococcus  ( although CXR clear ) as well as this consider adrenal insufficiency and transplant rejection. Blood cultures were drawn in dialysis Thursday  ESRD - [facility] TTS  HTN/volume  Controlled  Anemia no ESA at present Hb 10.1  I suspect this will drop in hospital  Bones will check phos and adjust binders  HLD  Stable on statin  Depression stable  On SSRI    LOS: 1 [place] W  :34 AM

## 2015-04-29 NOTE — Progress Notes (Signed)
Placed patient on CPAP for the night via auto-mode with minimum pressure set at 4cm and maximum pressure set at 20cm.  

## 2015-04-29 NOTE — Progress Notes (Signed)
Pharmacy Antibiotic Note  Valerie Perez is a 55 y.o. female admitted on 04/28/2015 with fever in HD patient.  Pharmacy has been consulted for Zosyn dosing.  Adding Vancomycin 3/5 Random level this AM = 16 -- most likely received a dose at HD Thursday  Pt has ESRD on HD Tues/Thur/Sat, pt also has PD catheter in place but not receiving PD at this time  Plan: -Continue Zosyn 2.25g IV q8h -Small dose of Vancomycin now at 1 gram, then 1 gram iv Q HD (Currently TTS) -Trend WBC, temp, HD schedule   Height: 5\' 5"  (165.1 cm) Weight: 241 lb 9 oz (109.572 kg) IBW/kg (Calculated) : 57  Temp (24hrs), Avg:100.7 F (38.2 C), Min:98.7 F (37.1 C), Max:101.9 F (38.8 C)   Recent Labs Lab 04/28/15 1930 04/28/15 1937 04/29/15 0803  WBC 3.7*  --   --   CREATININE 2.80*  --   --   LATICACIDVEN  --  1.21  --   VANCORANDOM  --   --  16    Estimated Creatinine Clearance: 27.6 mL/min (by C-G formula based on Cr of 2.8).    Allergies  Allergen Reactions  . Tape Other (See Comments)    Tears skin  . 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    Valerie Perez, Valerie Perez Kay 04/29/2015 7:01 PM

## 2015-04-29 NOTE — Progress Notes (Signed)
Patient arrived on unit via stretcher. Patient alert and oriented x4. Patient has been placed on droplet precautions to rule out influenza. Patient oriented to room, unit and staff. Patient's skin assessment completed with Dorita Sciaraathie W, RN, old scabs and excoriations noted. Patient's IV clean, dry and intact. Patient rated pain 0/10. Safety Fall Prevention Plan was given, discussed and signed by patient. Orders have been reviewed and implemented. Will continue to monitor the patient. Call light has been placed within reach and bed alarm has been activated.   Rivka BarbaraZenab Taym Twist BSN, RN  Phone Number: [phone]

## 2015-04-29 NOTE — Progress Notes (Signed)
Subjective: NAEON.  Patient had a documented fever but reports improvement since that time.  She first noticed her fever on Thursday and has continued to improve symptomatically since that time.  She feels fine today and would like to go home.  She denies CP, SOB, or abdominal pain.  Objective: Vital signs in last 24 hours: Filed Vitals:   04/28/15 2045 04/28/15 2218 04/28/15 2328 04/29/15 0501  BP: 176/80 169/69  149/69  Pulse: 72 77 80 66  Temp:  101.5 F (38.6 C)  98.7 F (37.1 C)  TempSrc:  Oral  Oral  Resp:  19  18  Height:      Weight:      SpO2: 95% 95% 97% 98%   Weight change:   Intake/Output Summary (Last 24 hours) at 04/29/15 0917 Last data filed at 04/29/15 0700  Gross per 24 hour  Intake    960 ml  Output    500 ml  Net    460 ml   Physical Exam  Constitutional: She is oriented to person, place, and time.  Obese female, sitting in bed, calm, pleasant, NAD.  HENT:  Head: Normocephalic and atraumatic.  Eyes: EOM are normal. No scleral icterus.  Neck: No tracheal deviation present.  Cardiovascular: Normal rate, regular rhythm and intact distal pulses.   Grade II/VI systolic decrescendo murmur heard best at RUSB.  Pulmonary/Chest: Effort normal and breath sounds normal. No stridor. No respiratory distress. She has no wheezes.  No crackles.  Abdominal: Soft. She exhibits no distension. There is no tenderness. There is no rebound and no guarding.  Musculoskeletal: She exhibits no edema.  Neurological: She is alert and oriented to person, place, and time.  Skin: Skin is warm and dry.  PD catheter in place to midabdomen. Incision clean and dry without surrounding erythema or drainage.  HD catheter in place to right upper chest. Incision clean dry without surrounding erythema or drainage.    Lab Results: Basic Metabolic Panel:  Recent Labs Lab 04/28/15 1930  NA 138  K 4.1  CL 96*  CO2 28  GLUCOSE 147*  BUN 12  CREATININE 2.80*  CALCIUM 8.9   Liver  Function Tests:  Recent Labs Lab 04/28/15 1930  AST 30  ALT 15  ALKPHOS 100  BILITOT 0.8  PROT 6.2*  ALBUMIN 3.3*   No results for input(s): LIPASE, AMYLASE in the last 168 hours. No results for input(s): AMMONIA in the last 168 hours. CBC:  Recent Labs Lab 04/28/15 1930  WBC 3.7*  NEUTROABS 1.4*  HGB 10.1*  HCT 32.8*  MCV 93.4  PLT 320   Cardiac Enzymes: No results for input(s): CKTOTAL, CKMB, CKMBINDEX, TROPONINI in the last 168 hours. BNP: No results for input(s): PROBNP in the last 168 hours. D-Dimer: No results for input(s): DDIMER in the last 168 hours. CBG:  Recent Labs Lab 04/28/15 2213 04/29/15 0724  GLUCAP 109* 88   Hemoglobin A1C: No results for input(s): HGBA1C in the last 168 hours. Fasting Lipid Panel: No results for input(s): CHOL, HDL, LDLCALC, TRIG, CHOLHDL, LDLDIRECT in the last 168 hours. Thyroid Function Tests: No results for input(s): TSH, T4TOTAL, FREET4, T3FREE, THYROIDAB in the last 168 hours. Coagulation: No results for input(s): LABPROT, INR in the last 168 hours. Anemia Panel: No results for input(s): VITAMINB12, FOLATE, FERRITIN, TIBC, IRON, RETICCTPCT in the last 168 hours. Urine Drug Screen: Drugs of Abuse  No results found for: LABOPIA, COCAINSCRNUR, LABBENZ, AMPHETMU, THCU, LABBARB  Alcohol Level: No results  for input(s): ETH in the last 168 hours. Urinalysis:  Recent Labs Lab 04/28/15 2025  COLORURINE YELLOW  LABSPEC 1.010  PHURINE 8.5*  GLUCOSEU 250*  HGBUR SMALL*  BILIRUBINUR NEGATIVE  KETONESUR NEGATIVE  PROTEINUR >300*  NITRITE NEGATIVE  LEUKOCYTESUR NEGATIVE   Misc. Labs:   Micro Results: Recent Results (from the past 240 hour(s))  MRSA PCR Screening     Status: None   Collection Time: 04/29/15  1:47 AM  Result Value Ref Range Status   MRSA by PCR NEGATIVE NEGATIVE Final    Comment:        The GeneXpert MRSA Assay (FDA approved for NASAL specimens only), is one component of a comprehensive MRSA  colonization surveillance program. It is not intended to diagnose MRSA infection nor to guide or monitor treatment for MRSA infections.   Body fluid culture     Status: None (Preliminary result)   Collection Time: 04/29/15  2:06 AM  Result Value Ref Range Status   Specimen Description FLUID PERITONEAL  Final   Special Requests NONE  Final   Gram Stain   Final    RARE WBC PRESENT, PREDOMINANTLY MONONUCLEAR NO ORGANISMS SEEN    Culture PENDING  Incomplete   Report Status PENDING  Incomplete   Studies/Results: Dg Chest 2 View  04/28/2015  CLINICAL DATA:  Fever cough.  Dialysis 2 days ago. EXAM: CHEST  2 VIEW COMPARISON:  None. FINDINGS: Mild to moderate cardiomegaly is noted. Both lungs are clear. No evidence of pleural effusion. Dual-lumen central venous dialysis catheter are seen in appropriate position. IMPRESSION: Cardiomegaly.  No active lung disease. Electronically Signed   By: Myles Rosenthal M.D.   On: 04/28/2015 19:53   Medications: I have reviewed the patient's current medications. Scheduled Meds: . amLODipine  10 mg Oral Daily  . buPROPion  150 mg Oral QHS  . calcitRIOL  0.25 mcg Oral Daily  . cycloSPORINE modified  225 mg Oral BID  . DULoxetine  60 mg Oral QHS  . fluticasone  1 spray Each Nare q morning - 10a  . gabapentin  300 mg Oral QHS  . heparin  5,000 Units Subcutaneous 3 times per day  . insulin aspart  10 Units Subcutaneous TID WC  . insulin glargine  35 Units Subcutaneous BID  . levothyroxine  75 mcg Oral QAC breakfast  . loratadine  10 mg Oral q morning - 10a  . montelukast  10 mg Oral QHS  . mycophenolate  540 mg Oral BID  . piperacillin-tazobactam (ZOSYN)  IV  2.25 g Intravenous 3 times per day  . pravastatin  40 mg Oral QHS  . predniSONE  5 mg Oral Q breakfast  . sevelamer carbonate  800 mg Oral BID WC   Continuous Infusions:  PRN Meds:.acetaminophen **OR** acetaminophen, heparin Assessment/Plan: Active Problems:   Obstructive sleep apnea   KIDNEY  TRANSPLANTATION, HX OF   Fever in adult   HTN (hypertension)   Diabetes (HCC)   Hypothyroidism   Immunosuppression (HCC)   ESRD on dialysis [facility])  Ms. Hofland is a 55 y.o. 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   Fever of Unknown Origin: Patient presenting with a 3 day history of febrile illness while at hemodialysis and subsequently received two courses of antibiotics. Associated with sore throat, dry cough, congestion, body aches. Flu negative, but  influenza like illness from other viral syndrome is possible.  Drug fever is another possibility given her medications.  Her cyclosporine is closely followed by her nephrologist with necessary dose adjustments. Unfortunately, she is at risk for bloodstream infections since she is a hemodialysis patient on chronic immunosuppression.Due to newly diagnosed systolic RUSB murmur, will evaluate patient for possible endocarditis and continue abx.  We appreciated nephrology's help in following her blood culture results and antibiotics already administered at dialysis.  It is comforting that patient feels improved and desires to go home. - Zosyn IV per pharmacy  Vanc level; if elevated, can hold Vanc dosing until culture/abx received at dialysis are known; if negative, will start Vanc  BCx  Peritoneal fluid cultures  TTE - Tylenol  ESRD on HD (TTS): Has a PD catheter in her abdomen that has only been used for flushing. Otherwise has an HD catheter in right upper chest. Patient had transplant in 2000 but is chronically rejecting her kidney transplant and started back on HD in 2016. 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 She is still rejecting her kidney and on immunosuppressant therapy (cyclosporine, mycophenolate, prednisone).  Primary nephrologist is Dr. Eliott Nine. - Nephrology consult - Cyclosporine, Mycophenolate, and Prednsione  Diabetes Mellitus Type 2: home meds are Lantus 45units BID, Humalog sliding scale TID, gabapentin - Lantus 35 units BID - Novolog 10 units TID - Gabapentin 300 mg qHS  Normocytic Anemia: hemoglobin 10.1 on admission, likely anemia chronic disease HLD: Pravastatin  QHS HTN: Home Amlodipine 5 mg and Labetalol 100 mg BID Depression: Home Duloxetine and Buproprion OSA: CPAP Hypothyroidism:Levothyroxine  Seasonal Allergies: Loratidine10mg  daily, Singulair, and Fluticasone daily  FEN - Fluids: none - Electrolytes: monitor - Nutrition: heart healthy/carb modified  DVT PPx: Heparin  CODE: FULL   Dispo: Disposition is deferred at this time, awaiting improvement of current medical problems.  Anticipated discharge in approximately 1-2 day(s).   The patient does have a current PCP (Pcp Not In System) and does need an [facility] hospital follow-up appointment after discharge.  The patient does not have transportation limitations that hinder transportation to clinic appointments.  .Services Needed at time of discharge: Y = Yes, Blank = No PT:   OT:   RN:   Equipment:   Other:     LOS: 1 day   Jana Half, MD 04/29/2015, 9:17 AM

## 2015-04-30 ENCOUNTER — Ambulatory Visit (HOSPITAL_COMMUNITY): Payer: Commercial Managed Care - PPO

## 2015-04-30 ENCOUNTER — Other Ambulatory Visit (HOSPITAL_COMMUNITY): Payer: Medicare Other

## 2015-04-30 ENCOUNTER — Inpatient Hospital Stay (HOSPITAL_COMMUNITY): Payer: Commercial Managed Care - PPO

## 2015-04-30 DIAGNOSIS — T8571XA Infection and inflammatory reaction due to peritoneal dialysis catheter, initial encounter: Principal | ICD-10-CM

## 2015-04-30 DIAGNOSIS — T8611 Kidney transplant rejection: Secondary | ICD-10-CM

## 2015-04-30 DIAGNOSIS — R509 Fever, unspecified: Secondary | ICD-10-CM

## 2015-04-30 DIAGNOSIS — B9689 Other specified bacterial agents as the cause of diseases classified elsewhere: Secondary | ICD-10-CM

## 2015-04-30 LAB — CBC
HCT: 33.3 % — ABNORMAL LOW (ref 36.0–46.0)
HEMOGLOBIN: 10.1 g/dL — AB (ref 12.0–15.0)
MCH: 28.6 pg (ref 26.0–34.0)
MCHC: 30.3 g/dL (ref 30.0–36.0)
MCV: 94.3 fL (ref 78.0–100.0)
PLATELETS: 325 10*3/uL (ref 150–400)
RBC: 3.53 MIL/uL — AB (ref 3.87–5.11)
RDW: 16.2 % — ABNORMAL HIGH (ref 11.5–15.5)
WBC: 4.9 10*3/uL (ref 4.0–10.5)

## 2015-04-30 LAB — RENAL FUNCTION PANEL
ANION GAP: 14 (ref 5–15)
Albumin: 3.1 g/dL — ABNORMAL LOW (ref 3.5–5.0)
BUN: 36 mg/dL — ABNORMAL HIGH (ref 6–20)
CALCIUM: 9.1 mg/dL (ref 8.9–10.3)
CHLORIDE: 103 mmol/L (ref 101–111)
CO2: 26 mmol/L (ref 22–32)
CREATININE: 6.01 mg/dL — AB (ref 0.44–1.00)
GFR, EST AFRICAN AMERICAN: 8 mL/min — AB (ref >60)
GFR, EST NON AFRICAN AMERICAN: 7 mL/min — AB (ref >60)
Glucose, Bld: 84 mg/dL (ref 65–99)
Phosphorus: 5.2 mg/dL — ABNORMAL HIGH (ref 2.5–4.6)
Potassium: 4.1 mmol/L (ref 3.5–5.1)
SODIUM: 143 mmol/L (ref 135–145)

## 2015-04-30 LAB — URINE CULTURE: CULTURE: NO GROWTH

## 2015-04-30 LAB — GLUCOSE, CAPILLARY
GLUCOSE-CAPILLARY: 76 mg/dL (ref 65–99)
GLUCOSE-CAPILLARY: 156 mg/dL — AB (ref 65–99)
Glucose-Capillary: 72 mg/dL (ref 65–99)

## 2015-04-30 MED ORDER — CALCITRIOL 0.25 MCG PO CAPS: 0.7500 ug | ORAL_CAPSULE | ORAL | Status: DC

## 2015-04-30 MED ORDER — DARBEPOETIN ALFA 150 MCG/0.3ML IJ SOSY: 150.0000 ug | PREFILLED_SYRINGE | INTRAMUSCULAR | Status: DC

## 2015-04-30 MED ORDER — MYCOPHENOLATE SODIUM 180 MG PO TBEC: 180.0000 mg | DELAYED_RELEASE_TABLET | Freq: Two times a day (BID) | ORAL | Status: DC

## 2015-04-30 MED ORDER — DEXTROSE 5 % IV SOLN: 2.0000 g | INTRAVENOUS | Status: AC

## 2015-04-30 MED ORDER — CYCLOSPORINE MODIFIED (NEORAL) 25 MG PO CAPS: 100.0000 mg | ORAL_CAPSULE | Freq: Two times a day (BID) | ORAL | Status: DC

## 2015-04-30 MED ORDER — LEVOTHYROXINE SODIUM 75 MCG PO TABS: 75.0000 ug | ORAL_TABLET | Freq: Every day | ORAL | Status: DC

## 2015-04-30 MED ORDER — DEXTROSE 5 % IV SOLN: 2.0000 g | INTRAVENOUS | Status: DC

## 2015-04-30 MED ORDER — SEVELAMER CARBONATE 800 MG PO TABS: 1600.0000 mg | ORAL_TABLET | Freq: Two times a day (BID) | ORAL | Status: DC

## 2015-04-30 MED ORDER — CYCLOSPORINE MODIFIED (NEORAL) 25 MG PO CAPS
100.0000 mg | ORAL_CAPSULE | Freq: Two times a day (BID) | ORAL | Status: DC
  Filled 2015-04-30: qty 4

## 2015-04-30 MED ORDER — CALCITRIOL 0.5 MCG PO CAPS: 0.7500 ug | ORAL_CAPSULE | ORAL | Status: DC

## 2015-04-30 MED ORDER — VANCOMYCIN HCL IN DEXTROSE 1-5 GM/200ML-% IV SOLN: 1000.0000 mg | INTRAVENOUS | Status: AC

## 2015-04-30 MED ORDER — PIPERACILLIN-TAZOBACTAM IN DEX 2-0.25 GM/50ML IV SOLN
2.2500 g | Freq: Three times a day (TID) | INTRAVENOUS | Status: DC
  Administered 2015-04-30: 2.25 g via INTRAVENOUS
  Filled 2015-04-30 (×2): qty 50

## 2015-04-30 MED ORDER — RENA-VITE PO TABS: 1.0000 | ORAL_TABLET | Freq: Every day | ORAL | Status: DC

## 2015-04-30 NOTE — Progress Notes (Signed)
[facility] Progress Note  Assessment/Plan: 1. Fevers - started 3/2 post HD - Vanc and Fortaz started then; fevers persistent PD fluid 3/3 1327 RBC and 179 WBC; repeat PD fluid 3/6 clear, WBC 12; urine neg CMV pending - now on Vanc and Zosyn 2. 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 Anemia - hgb 10.1 stable - continue ESA - due for redose next week 4. Secondary hyperparathyroidism - change to calcitriol 0.75  TTS which is her outpt dose; ^ renvela to 2 ac = outpt dose- she needs to stop daily calcitriol at d/c 5. HTN/volume - current meds -titrate edw 6. Nutrition - renal/carb mod/vitamin 7. Depression - on cymbalta/bupropion  Sheffield SliderMartha B Bergman, PA-C [facility] Beeper [phone] 04/30/2015,11:37 AM  LOS: 2 days   Pt seen, examined and agree w A/P as above.  Have to assume fever was from PD cath-related peritonitis (no culture sent, but cell count was high and fluid hazy on 3/3 when flushed) as outpatient blood cx's negative and rest of w/u negative.  Have d/w Dr. Eliott Nineunham, we are recommending that PD cath be removed (in the outpatient setting) and have asked the patient to [facility] that [facility].  Patient is agreeable to discuss with the surgeons and will contact them when she gets home.  In our opinion ventral hernia needs to be repaired before being considered for another catheter.  We will arrange for two weeks of empiric abx (Vanc/ Elita QuickFortaz) at hemodialysis for presumed peritonitis. Have d/w primary team.  Vinson Moselleob Andra Matsuo MD [facility] pager [phone]    cell [phone] 04/30/2015, 1:16 PM    Subjective:   Feels better. Wants to know when she can go home. Not coughing  Objective Filed Vitals:   04/29/15 0501 04/29/15 1955 04/30/15 0407 04/30/15 1000  BP: 149/69 157/57 135/45 138/61  Pulse: 66 60 63 58   Temp: 98.7 F (37.1 C) 98.3 F (36.8 C) 98.5 F (36.9 C) 97.9 F (36.6 C)  TempSrc: Oral Oral Oral Oral  Resp: 18 20 18 18   Height:      Weight:  109.9 kg (242 lb 4.6 oz)    SpO2: 98% 99% 100% 100%   Physical Exam General: NAD sitting in chair Heart: RRR Lungs: no rales Abdomen: obese very large panus with ventral hernia - PD cath -hard to palpate LLQ kidney - though LLQ nontender Extremities: no sig edema Dialysis Access: right IJ and PD cath  Dialysis Orders: TTS [facility] 180 400/ A1.5 EDW 110 - gets to 109 at times - low gains 2K 2 Ca right IJ heparin 3000 Mircera 150 q 2 weeks last 2/28 calcitriol 0.75 Recent labs:  Hgb 10.2 21% sat ferritin 498 - s/p IV Fe - iPTH 597 [facility] 969 [facility] - received 2 gm IV Fortaz 3/2 and 3/4 and Vanc 2 gm 3/2 and 1 gm 3/4 PD fluid 3/3 1327 RBC 179 WBC   Additional Objective Labs: Basic Metabolic Panel:  Recent Labs Lab 04/28/15 1930  NA 138  K 4.1  CL 96*  CO2 28  GLUCOSE 147*  BUN 12  CREATININE 2.80*  CALCIUM 8.9   Liver Function Tests:  Recent Labs Lab 04/28/15 1930  AST 30  ALT 15  ALKPHOS 100  BILITOT 0.8  PROT 6.2*  ALBUMIN 3.3*  CBC:  Recent Labs Lab 04/28/15 1930  WBC 3.7*  NEUTROABS 1.4*  HGB 10.1*  HCT 32.8*  MCV 93.4  PLT 320   Blood Culture    Component Value Date/Time   SDES FLUID PERITONEAL 04/29/2015 0206   SPECREQUEST NONE 04/29/2015 0206   CULT PENDING 04/29/2015 0206   REPTSTATUS PENDING 04/29/2015 0206    CBG:  Recent Labs Lab 04/28/15 2213 04/29/15 0724 04/29/15 1240 04/29/15 1628 04/30/15 0733  GLUCAP 109* 88 93 130* 72    Studies/Results: Dg Chest 2 View  04/28/2015  CLINICAL DATA:  Fever cough.  Dialysis 2 days ago. EXAM: CHEST  2 VIEW COMPARISON:  None. FINDINGS: Mild to moderate cardiomegaly is noted. Both lungs are clear. No evidence of pleural effusion. Dual-lumen central venous dialysis catheter are seen in appropriate position. IMPRESSION:  Cardiomegaly.  No active lung disease. Electronically Signed   By: Myles Rosenthal M.D.   On: 04/28/2015 19:53   Medications:   . amLODipine  10 mg Oral Daily  . buPROPion  150 mg Oral QHS  . calcitRIOL  0.25 mcg Oral Daily  . cycloSPORINE modified  225 mg Oral BID  . DULoxetine  60 mg Oral QHS  . fluticasone  1 spray Each Nare q morning - 10a  . gabapentin  300 mg Oral QHS  . heparin  5,000 Units Subcutaneous 3 times per day  . insulin aspart  10 Units Subcutaneous TID WC  . insulin glargine  35 Units Subcutaneous BID  . labetalol  100 mg Oral BID  . levothyroxine  75 mcg Oral QAC breakfast  . loratadine  10 mg Oral q morning - 10a  . montelukast  10 mg Oral QHS  . mycophenolate  540 mg Oral BID  . piperacillin-tazobactam (ZOSYN)  IV  2.25 g Intravenous 3 times per day  . pravastatin  40 mg Oral QHS  . predniSONE  5 mg Oral Q breakfast  . sevelamer carbonate  800 mg Oral BID WC  . [START ON 05/01/2015] vancomycin  1,000 mg Intravenous Q T,Th,Sa-HD

## 2015-04-30 NOTE — Progress Notes (Signed)
Pharmacy Antibiotic Note  Valerie Perez is a 55 y.o. female admitted on 04/28/2015 with fever in HD patient.  Pharmacy has been consulted to dose Zosyn and vancomycin.  Zosyn 3/5 >> Vancomycin 3/5 >> Random vanc level 3/6 = 16 -- most likely received a dose at HD Thursday  Pt has ESRD on HD Tues/Thur/Sat, pt also has PD catheter in place but not receiving PD at this time.    Patient last received 1g vancomycin 3/5. Predicted post-HD level = 14 mcg/mL following 3/7 dose  Plan: -Continue Zosyn 2.25g IV q8h -Continue vancomycin 1g IV qT-Th-Sa w/ HD -Trend s/sx of infection, tolerance of HD   Height: 5\' 5"  (165.1 cm) Weight: 242 lb 4.6 oz (109.9 kg) IBW/kg (Calculated) : 57  Temp (24hrs), Avg:98.2 F (36.8 C), Min:97.9 F (36.6 C), Max:98.5 F (36.9 C)   Recent Labs Lab 04/28/15 1930 04/28/15 1937 04/29/15 0803  WBC 3.7*  --   --   CREATININE 2.80*  --   --   LATICACIDVEN  --  1.21  --   VANCORANDOM  --   --  16    Estimated Creatinine Clearance: 27.7 mL/min (by C-G formula based on Cr of 2.8).    Allergies  Allergen Reactions  . Tape Other (See Comments)    Tears skin  . 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    Gus HeightPhillip Jasmin Trumbull, PharmD Candidate 04/30/2015 10:48 AM

## 2015-04-30 NOTE — Progress Notes (Signed)
Internal Medicine Attending  Date: 04/30/2015  Patient name: Valerie Perez Medical record number: [id number] Date of birth: September 17, 1960 Age: 55 y.o. Gender: female  I saw and evaluated the patient. I reviewed the resident's note by Dr. Ladona Ridgelaylor and I agree with the resident's findings and plans as documented in his progress note.  Valerie Perez continues to feel well and is without specific complaints. We appreciate nephrology's input and information regarding the specifics of the fluid drawn on March 3. Apparently it is consistent with peritonitis associated with the catheter. Nephrology is ranging antibiotics at dialysis for the next 2 weeks and recommending that the catheter be removed and the ventral hernia be repaired as an outpatient. I agree with the plans to discharge her home today with the intervention as noted above.

## 2015-04-30 NOTE — Discharge Summary (Signed)
Name: Valerie Perez MRN: [id number] DOB: 01-28-1961 55 y.o. PCP: Lucianne Lei  Date of Admission: 04/28/2015  6:19 PM Date of Discharge: 04/30/2015 Attending Physician: Doneen Poisson, MD  Discharge Diagnosis: 1. PD Catheter Related Infection 2. 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     allopurinol 300 MG tablet  Commonly known as:  ZYLOPRIM  Take 300 mg by mouth daily.     amLODipine 5 MG tablet  Commonly known as:  NORVASC  Take 10 mg by mouth daily.     buPROPion 150 MG 24 hr tablet  Commonly known as:  WELLBUTRIN XL  Take 150 mg by mouth at bedtime.     calcitRIOL 0.25 MCG capsule  Commonly known as:  ROCALTROL  Take 3 capsules (0.75 mcg total) by mouth Every Tuesday,Thursday,and Saturday with dialysis.  Start taking on:  05/01/2015     cefTAZidime 2 g in dextrose 5 % 50 mL  Inject 2 g into the vein Every Tuesday,Thursday,and Saturday with dialysis. Last dose 05/12/15.  Start taking on:  05/01/2015     cycloSPORINE modified 25 MG capsule  Commonly known as:  NEORAL  Take 4 capsules (100 mg total) by mouth 2 (two) times daily.     Darbepoetin Alfa 150 MCG/0.3ML Sosy injection  Commonly known as:  ARANESP  Inject 0.3 mLs (150 mcg total) into the vein every Tuesday with hemodialysis.  Start taking on:  05/08/2015     DULoxetine 60 MG capsule  Commonly known as:  CYMBALTA  Take 60 mg by mouth at bedtime.     fluticasone 50 MCG/ACT nasal spray  Commonly known as:  FLONASE  Place 1 spray into both nostrils every morning.     gabapentin 100 MG  capsule  Commonly known as:  NEURONTIN  Take 300 mg by mouth at bedtime.     insulin glargine 100 UNIT/ML injection  Commonly known as:  LANTUS  Inject 45 Units into the skin 2 (two) times daily.     insulin lispro 100 UNIT/ML injection  Commonly known as:  HUMALOG  Inject 12 Units into the skin 3 (three) times daily before meals. Sliding scale: 12 units with each meal. Add 1 unit every 25 point BG > 150. Ex: BG 175, add 1 unit = 13 unit; BG 200, add 2 unit = 14 unit.     labetalol 200 MG tablet  Commonly known as:  NORMODYNE  Take 100 mg by mouth 2 (two) times daily.     levothyroxine 75 MCG tablet  Commonly known as:  SYNTHROID, LEVOTHROID  Take 1 tablet (75 mcg total) by mouth daily before breakfast.     loratadine 10 MG tablet  Commonly known as:  CLARITIN  Take 10 mg by mouth every morning.     montelukast 10 MG tablet  Commonly known as:  SINGULAIR  Take 10 mg by mouth at bedtime.     mycophenolate 180 MG EC tablet  Commonly known as:  MYFORTIC  Take  1 tablet (180 mg total) by mouth 2 (two) times daily.     polyethylene glycol packet  Commonly known as:  MIRALAX / GLYCOLAX  Take 17 g by mouth daily.     pravastatin 40 MG tablet  Commonly known as:  PRAVACHOL  Take 40 mg by mouth at bedtime.     predniSONE 5 MG tablet  Commonly known as:  DELTASONE  Take 5 mg by mouth daily with breakfast.     sevelamer carbonate 800 MG tablet  Commonly known as:  RENVELA  Take 800 mg by mouth 2 (two) times daily.     vancomycin 1 GM/200ML Soln  Commonly known as:  VANCOCIN  Inject 200 mLs (1,000 mg total) into the vein Every Tuesday,Thursday,and Saturday with dialysis. For two weeks.  Last dose 05/12/15.  Start taking on:  05/01/2015        Disposition and follow-up:   Valerie Perez was discharged from [facility] in Good condition.  At the hospital follow up visit please address:  1.  Repeated fevers, immunosuppression dosing, surgical follow-up  for PD catheter removal and hernia repair  2.  Labs / imaging needed at time of follow-up: none  3.  Pending labs/ test needing follow-up: blood cultures (from [facility]), peritoneal cultures, echo  Follow-up Appointments: Follow-up Information    Follow up with Lucianne Lei, MD. Go on 05/14/2015.   Specialty:  Internal Medicine   Why:  1:30 pm   Contact information:   [address] [place] [address] [phone]       Follow up with POWELL, Ciro Backer, MD. Schedule an appointment as soon as possible for a visit in 1 month.   Specialty:  General Surgery   Why:  PD catheter   Contact information:   [address] [place] [address] [phone]       Discharge Instructions: Discharge Instructions    Call MD for:  extreme fatigue    Complete by:  As directed      Call MD for:  persistant dizziness or light-headedness    Complete by:  As directed      Call MD for:  redness, tenderness, or signs of infection (pain, swelling, redness, odor or green/yellow discharge around incision site)    Complete by:  As directed      Call MD for:  temperature >100.4    Complete by:  As directed      Diet - low sodium heart healthy    Complete by:  As directed      Increase activity slowly    Complete by:  As directed            Consultations: Treatment Team:  Elvis Coil, MD  Procedures Performed:  Dg Chest 2 View  04/28/2015  CLINICAL DATA:  Fever cough.  Dialysis 2 days ago. EXAM: CHEST  2 VIEW COMPARISON:  None. FINDINGS: Mild to moderate cardiomegaly is noted. Both lungs are clear. No evidence of pleural effusion. Dual-lumen central venous dialysis catheter are seen in appropriate position. IMPRESSION: Cardiomegaly.  No active lung disease. Electronically Signed   By: Myles Rosenthal M.D.   On: 04/28/2015 19:53    2D Echo: pending  Cardiac Cath:   Admission HPI: Valerie Perez is a 55 y.o. woman with  past medical history of ESRD on HD (Tu, Th, Sa) s/p kidney transplant (2000) with chronic rejection, DM type 2, HTN, HLD,  hypothyroidism, OSA, depression who presents due to fevers at dialysis. Patient was in usual state of health until this Thursday when she began having chills while at HD. Temperature was taken and noted to be 100.7. She was given antibiotics intravenously, but she is unsure which ones. She also thinks blood cultures were taken before getting antibiotics. She received Tylenol and defervesced at this time as well. Other symptoms on Thursday included a "tickle in her throat" that lasted until Friday, congestion, body aches, dry cough, decreased appetite. States on Friday she "felt fair" with one episode of watery, loose stool. Today, she went to dialysis and was noted to have temperature of 99.9. She was given antibiotics again and sent to the emergency department. In the ED, her temperature was noted to be 101.9. She reports another loose bowel movement today described as watery and had some nausea. She reports mild headache with fever. No neck stiffness or confusion. No chest pain, dyspnea, sputum production, abdominal pain, dysuria, vomiting. She has a PD catheter in abdomen since mid-February and chest dialysis catheter replaced February 13.  She denies any recent travel, exposure to anything new. She reports husband was sick with "a cold" two weeks ago and she watches her 57 year old grand son twice per week.  In the ED here, she had a urinalysis that did not indicate infection, CXR with no acute process. CBC revealed a WBC of 3.7 (ANC 1.4), H/H 10.1/32.8, MCV 93.4. Repeat blood cultures were obtained, lactic acid was normal, influenza PCR obtained, and cultures were obtained from catheter site.  PMHx: DM, HTN, HLD, ESRD s/p transplant chronically rejected on HD, OSA, depression, hypothyroidism  FHx: mother died of colon cancer age 57, father died of thyroid cancer age  57  Surgical Hx: kidney transplant (2000), hysterectomy 2003  Social Hx: disabled since 2012 but used to work as Designer, jewelleryteachers aide, denies tobacco, EtOH, or elicit drugs. Married, [place], [place]. Goes [facility] [employer] [facility] [employer] [facility] [employer].  Hospital Course by problem list: Active Problems:   Obstructive sleep apnea   KIDNEY TRANSPLANTATION, HX OF   Fever in adult   HTN (hypertension)   Diabetes (HCC)   Hypothyroidism   Immunosuppression (HCC)   ESRD on dialysis [facility](HCC)   PD Catheter Related Infection: Patient had blood and peritoneal cultures drawn at dialysis and received Vancomycin and Fortaz.  She had a repeat documented fever while admitted, but denied complaint. She was given broad spectrum antibiotics with Vanc and Zosyn.  Blood cultures from dialysis and inpatient remained negative.  Original PD catheter sample taken at dialysis revealed WBC count of 179.  She was discharged on 2 week course of Vancomycin and Fortaz to be received at dialysis.  She will also need surgical follow up for removal of her PD catheter and hernia repair.    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  Due to chronic rejection and concern for new infection, her medications were reduced to Cyclosporine 100 mg BID and Mycophenolate 180 mg BID.  At follow up, please address patient's need for continued immunosuppression and proper dose adjustments.  Discharge Vitals:   BP 138/61 mmHg  Pulse 58  Temp(Src) 97.9 F (36.6 C) (Oral)  Resp 18  Ht 5\' 5"  (1.651 m)  Wt 242 lb 4.6 oz (109.9 kg)  BMI 40.32 kg/m2  SpO2 100%  Discharge Labs:  Results for orders placed or performed during the hospital  encounter of 04/28/15 (from the past 24 hour(s))  Glucose, capillary     Status: Abnormal   Collection Time: 04/29/15  4:28 PM  Result Value Ref Range   Glucose-Capillary 130 (H) 65 - 99 mg/dL  Glucose, capillary      Status: None   Collection Time: 04/30/15  7:33 AM  Result Value Ref Range   Glucose-Capillary 72 65 - 99 mg/dL  Glucose, capillary     Status: None   Collection Time: 04/30/15 11:53 AM  Result Value Ref Range   Glucose-Capillary 76 65 - 99 mg/dL  CBC     Status: Abnormal   Collection Time: 04/30/15 12:09 PM  Result Value Ref Range   WBC 4.9 4.0 - 10.5 K/uL   RBC 3.53 (L) 3.87 - 5.11 MIL/uL   Hemoglobin 10.1 (L) 12.0 - 15.0 g/dL   HCT 29.5 (L) 62.1 - 30.8 %   MCV 94.3 78.0 - 100.0 fL   MCH 28.6 26.0 - 34.0 pg   MCHC 30.3 30.0 - 36.0 g/dL   RDW 65.7 (H) 84.6 - 96.2 %   Platelets 325 150 - 400 K/uL  Renal function panel     Status: Abnormal   Collection Time: 04/30/15 12:09 PM  Result Value Ref Range   Sodium 143 135 - 145 mmol/L   Potassium 4.1 3.5 - 5.1 mmol/L   Chloride 103 101 - 111 mmol/L   CO2 26 22 - 32 mmol/L   Glucose, Bld 84 65 - 99 mg/dL   BUN 36 (H) 6 - 20 mg/dL   Creatinine, Ser 9.52 (H) 0.44 - 1.00 mg/dL   Calcium 9.1 8.9 - 84.1 mg/dL   Phosphorus 5.2 (H) 2.5 - 4.6 mg/dL   Albumin 3.1 (L) 3.5 - 5.0 g/dL   GFR calc non Af Amer 7 (L) >60 mL/min   GFR calc Af Amer 8 (L) >60 mL/min   Anion gap 14 5 - 15    Signed: Jana Half, MD 04/30/2015, 2:35 PM    Services Ordered on Discharge: none Equipment Ordered on Discharge: none

## 2015-04-30 NOTE — Progress Notes (Signed)
Subjective: NAEON.  Patient denies fever, chills, CP, or SOB.  She has not had a repeat diarrheal episode since early yesterday morning.  Her prior diarrhea was 1 episode on Friday and 2 episodes on Saturday of loose, watery stool.  She feels good with no complaint.  Objective: Vital signs in last 24 hours: Filed Vitals:   04/29/15 0501 04/29/15 1955 04/30/15 0407 04/30/15 1000  BP: 149/69 157/57 135/45 138/61  Pulse: 66 60 63 58  Temp: 98.7 F (37.1 C) 98.3 F (36.8 C) 98.5 F (36.9 C) 97.9 F (36.6 C)  TempSrc: Oral Oral Oral Oral  Resp: 18 20 18 18   Height:      Weight:  242 lb 4.6 oz (109.9 kg)    SpO2: 98% 99% 100% 100%   Weight change: 11.6 oz (0.328 kg)  Intake/Output Summary (Last 24 hours) at 04/30/15 1119 Last data filed at 04/30/15 0900  Gross per 24 hour  Intake    780 ml  Output      0 ml  Net    780 ml   Physical Exam  Constitutional: She is oriented to person, place, and time.  Obese female, sitting in bed, calm, pleasant, NAD.  HENT:  Head: Normocephalic and atraumatic.  Eyes: EOM are normal. No scleral icterus.  Neck: No tracheal deviation present.  Cardiovascular: Normal rate, regular rhythm and intact distal pulses.   Grade II/VI systolic decrescendo murmur heard best at RUSB.  Pulmonary/Chest: Effort normal and breath sounds normal. No stridor. No respiratory distress. She has no wheezes.  No crackles.  Abdominal: Soft. She exhibits no distension. There is no tenderness. There is no rebound and no guarding.  Musculoskeletal: She exhibits no edema.  Neurological: She is alert and oriented to person, place, and time.  Skin: Skin is warm and dry.  PD catheter in place to midabdomen. Incision clean and dry without surrounding erythema or drainage.  HD catheter in place to right upper chest. Incision clean dry without surrounding erythema or drainage.    Lab Results: Basic Metabolic Panel:  Recent Labs Lab 04/28/15 1930  NA 138  K 4.1  CL  96*  CO2 28  GLUCOSE 147*  BUN 12  CREATININE 2.80*  CALCIUM 8.9   Liver Function Tests:  Recent Labs Lab 04/28/15 1930  AST 30  ALT 15  ALKPHOS 100  BILITOT 0.8  PROT 6.2*  ALBUMIN 3.3*   No results for input(s): LIPASE, AMYLASE in the last 168 hours. No results for input(s): AMMONIA in the last 168 hours. CBC:  Recent Labs Lab 04/28/15 1930  WBC 3.7*  NEUTROABS 1.4*  HGB 10.1*  HCT 32.8*  MCV 93.4  PLT 320   Cardiac Enzymes: No results for input(s): CKTOTAL, CKMB, CKMBINDEX, TROPONINI in the last 168 hours. BNP: No results for input(s): PROBNP in the last 168 hours. D-Dimer: No results for input(s): DDIMER in the last 168 hours. CBG:  Recent Labs Lab 04/28/15 2213 04/29/15 0724 04/29/15 1240 04/29/15 1628 04/30/15 0733  GLUCAP 109* 88 93 130* 72   Hemoglobin A1C: No results for input(s): HGBA1C in the last 168 hours. Fasting Lipid Panel: No results for input(s): CHOL, HDL, LDLCALC, TRIG, CHOLHDL, LDLDIRECT in the last 168 hours. Thyroid Function Tests: No results for input(s): TSH, T4TOTAL, FREET4, T3FREE, THYROIDAB in the last 168 hours. Coagulation: No results for input(s): LABPROT, INR in the last 168 hours. Anemia Panel: No results for input(s): VITAMINB12, FOLATE, FERRITIN, TIBC, IRON, RETICCTPCT in the last 168  hours. Urine Drug Screen: Drugs of Abuse  No results found for: LABOPIA, COCAINSCRNUR, LABBENZ, AMPHETMU, THCU, LABBARB  Alcohol Level: No results for input(s): ETH in the last 168 hours. Urinalysis:  Recent Labs Lab 04/28/15 2025  COLORURINE YELLOW  LABSPEC 1.010  PHURINE 8.5*  GLUCOSEU 250*  HGBUR SMALL*  BILIRUBINUR NEGATIVE  KETONESUR NEGATIVE  PROTEINUR >300*  NITRITE NEGATIVE  LEUKOCYTESUR NEGATIVE   Misc. Labs:   Micro Results: Recent Results (from the past 240 hour(s))  Culture, blood (routine x 2)     Status: None (Preliminary result)   Collection Time: 04/28/15  7:30 PM  Result Value Ref Range Status    Specimen Description BLOOD RIGHT ANTECUBITAL  Final   Special Requests BOTTLES DRAWN AEROBIC AND ANAEROBIC 5 CC  Final   Culture NO GROWTH < 24 HOURS  Final   Report Status PENDING  Incomplete  Culture, blood (routine x 2)     Status: None (Preliminary result)   Collection Time: 04/28/15  8:10 PM  Result Value Ref Range Status   Specimen Description BLOOD RIGHT HAND  Final   Special Requests IN PEDIATRIC BOTTLE 3CC  Final   Culture NO GROWTH < 24 HOURS  Final   Report Status PENDING  Incomplete  Urine culture     Status: None (Preliminary result)   Collection Time: 04/28/15  8:25 PM  Result Value Ref Range Status   Specimen Description URINE, CLEAN CATCH  Final   Special Requests NONE  Final   Culture NO GROWTH < 24 HOURS  Final   Report Status PENDING  Incomplete  MRSA PCR Screening     Status: None   Collection Time: 04/29/15  1:47 AM  Result Value Ref Range Status   MRSA by PCR NEGATIVE NEGATIVE Final    Comment:        The GeneXpert MRSA Assay (FDA approved for NASAL specimens only), is one component of a comprehensive MRSA colonization surveillance program. It is not intended to diagnose MRSA infection nor to guide or monitor treatment for MRSA infections.   Body fluid culture     Status: None (Preliminary result)   Collection Time: 04/29/15  2:06 AM  Result Value Ref Range Status   Specimen Description FLUID PERITONEAL  Final   Special Requests NONE  Final   Gram Stain   Final    RARE WBC PRESENT, PREDOMINANTLY MONONUCLEAR NO ORGANISMS SEEN    Culture PENDING  Incomplete   Report Status PENDING  Incomplete   Studies/Results: Dg Chest 2 View  04/28/2015  CLINICAL DATA:  Fever cough.  Dialysis 2 days ago. EXAM: CHEST  2 VIEW COMPARISON:  None. FINDINGS: Mild to moderate cardiomegaly is noted. Both lungs are clear. No evidence of pleural effusion. Dual-lumen central venous dialysis catheter are seen in appropriate position. IMPRESSION: Cardiomegaly.  No active lung  disease. Electronically Signed   By: Myles Rosenthal M.D.   On: 04/28/2015 19:53   Medications: I have reviewed the patient's current medications. Scheduled Meds: . amLODipine  10 mg Oral Daily  . buPROPion  150 mg Oral QHS  . calcitRIOL  0.25 mcg Oral Daily  . cycloSPORINE modified  225 mg Oral BID  . DULoxetine  60 mg Oral QHS  . fluticasone  1 spray Each Nare q morning - 10a  . gabapentin  300 mg Oral QHS  . heparin  5,000 Units Subcutaneous 3 times per day  . insulin aspart  10 Units Subcutaneous TID WC  . insulin glargine  35 Units Subcutaneous BID  . labetalol  100 mg Oral BID  . levothyroxine  75 mcg Oral QAC breakfast  . loratadine  10 mg Oral q morning - 10a  . montelukast  10 mg Oral QHS  . mycophenolate  540 mg Oral BID  . piperacillin-tazobactam (ZOSYN)  IV  2.25 g Intravenous 3 times per day  . pravastatin  40 mg Oral QHS  . predniSONE  5 mg Oral Q breakfast  . sevelamer carbonate  800 mg Oral BID WC  . [START ON 05/01/2015] vancomycin  1,000 mg Intravenous Q T,Th,Sa-HD   Continuous Infusions:  PRN Meds:.acetaminophen **OR** acetaminophen, heparin, ondansetron Assessment/Plan: Active Problems:   Obstructive sleep apnea   KIDNEY TRANSPLANTATION, HX OF   Fever in adult   HTN (hypertension)   Diabetes (HCC)   Hypothyroidism   Immunosuppression (HCC)   ESRD on dialysis [facility])  Ms. Overley is a 55 y.o. 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   Fever of Unknown Origin: Patient presenting with a 3 day history of febrile illness while at hemodialysis and subsequently received two courses of antibiotics. Associated with sore throat, dry cough, congestion, body aches. Given immunosuppressed status, there is concern for bacterial, viral, CMV, EBV, BK, atypical mycobacteria, nocardia, actinomyces, cryptococcus,  adrenal insufficiency, and transplant rejection.  Flu negative, but will check remaining respiratory viral panel.  CMV is definitely a possibility given her concurrent diarrhea.  Due to newly diagnosed systolic RUSB murmur, will evaluate patient for possible endocarditis and continue abx. No evidence of peritoneal infection from peritoneal catheter sample.  Unfortunately, management of these infectious diseases may require decreasing her immunosuppression.  Conversely, her graft is still functional, and decreasing immunosuppression would be at the cost of somewhat functional kidney tissue. Dr. Eliott Nine recommending decreasing her immunosuppression.  We appreciate nephrology's help in following her blood culture results and antibiotics already administered at dialysis, recommendations with further workup, and management of her immunosuppression.   - Vanc/Zosyn IV per pharmacy  CMV  RVP  BCx NGTD  TTE - Tylenol  ESRD on HD (TTS) s/p chronic rejection of transplant: Has a PD catheter in her abdomen that has only been used for flushing. Otherwise has an HD catheter in right upper chest. Patient had transplant in 2000 but is chronically rejecting her kidney transplant and started back on HD in 2016.Patient still makes urine, but graft not salvageable per patient.  Patient reports her primary nephrologist is Dr. Eliott Nine, who also manages her immunosuppression and recommends decreasing her Mycophenolate and Cyclosporine. - Nephrology consult - Decrease Cyclosporine 100 mg BID - Decrease Mycophenolate 180 mg BID - Prednisone 5 mg daily  Diabetes Mellitus Type 2: home meds are Lantus 45units BID, Humalog sliding scale TID, gabapentin - Lantus 35 units BID - Novolog 10 units TID - Gabapentin 300 mg qHS  Normocytic Anemia: hemoglobin 10.1 on admission, likely anemia chronic disease HLD: Pravastatin  QHS HTN: Home Amlodipine 5 mg and Labetalol 100 mg BID Depression: Home Duloxetine and  Buproprion OSA: CPAP Hypothyroidism:Levothyroxine  Seasonal Allergies: Loratidine10mg  daily, Singulair, and Fluticasone daily  FEN - Fluids: none - Electrolytes: monitor - Nutrition: heart healthy/carb modified  DVT PPx: Heparin  CODE: FULL   Dispo: Disposition is deferred at this time, awaiting improvement of current medical problems.  Anticipated discharge in approximately  1-2 day(s).   The patient does have a current PCP (Pcp Not In System) and [facility].  The patient does not have transportation limitations that hinder transportation to clinic appointments.  .Services Needed at time of discharge: Y = Yes, Blank = No PT:   OT:   RN:   Equipment:   Other:     LOS: 2 days   Jana HalfNicholas A Fern Canova, MD 04/30/2015, 11:19 AM

## 2015-04-30 NOTE — Discharge Instructions (Signed)
1. Continue antibiotics at dialysis. 2. Follow up with your surgeon for removal of PD catheter.   Fever, Adult A fever is an increase in the body's temperature. It is usually defined as a temperature of 100F (38C) or higher. Brief mild or moderate fevers generally have no long-term effects, and they often do not require treatment. Moderate or high fevers may make you feel uncomfortable and can sometimes be a sign of a serious illness or disease. The sweating that may occur with repeated or prolonged fever may also cause dehydration. Fever is confirmed by taking a temperature with a thermometer. A measured temperature can vary with:  Age.  Time of day.  Location of the thermometer:  Mouth (oral).  Rectum (rectal).  Ear (tympanic).  Underarm (axillary).  Forehead (temporal). HOME CARE INSTRUCTIONS Pay attention to any changes in your symptoms. Take these actions to help with your condition:  Take over-the counter and prescription medicines only as told by your health care provider. Follow the dosing instructions carefully.  If you were prescribed an antibiotic medicine, take it as told by your health care provider. Do not stop taking the antibiotic even if you start to feel better.  Rest as needed.  Drink enough fluid to keep your urine clear or pale yellow. This helps to prevent dehydration.  Sponge yourself or bathe with room-temperature water to help reduce your body temperature as needed. Do not use ice water.  Do not overbundle yourself in blankets or heavy clothes. SEEK MEDICAL CARE IF:  You vomit.  You cannot eat or drink without vomiting.  You have diarrhea.  You have pain when you urinate.  Your symptoms do not improve with treatment.  You develop new symptoms.  You develop excessive weakness. SEEK IMMEDIATE MEDICAL CARE IF:  You have shortness of breath or have trouble breathing.  You are dizzy or you faint.  You are disoriented or confused.  You  develop signs of dehydration, such as a dry mouth, decreased urination, or paleness.  You develop severe pain in your abdomen.  You have persistent vomiting or diarrhea.  You develop a skin rash.  Your symptoms suddenly get worse.   This information is not intended to replace advice given to you by your health care provider. Make sure you discuss any questions you have with your health care provider.   Document Released: 08/06/2000 Document Revised: 11/01/2014 Document Reviewed: 04/06/2014 Elsevier Interactive Patient Education [employer]2016 [employer]

## 2015-04-30 NOTE — Progress Notes (Signed)
  Echocardiogram 2D Echocardiogram has been performed.  Arvil ChacoFoster, Harmonee Tozer 04/30/2015, 3:04 PM

## 2015-05-01 LAB — RESPIRATORY VIRUS PANEL
ADENOVIRUS: NEGATIVE
INFLUENZA A: NEGATIVE
INFLUENZA B 1: POSITIVE — AB
Metapneumovirus: NEGATIVE
PARAINFLUENZA 3 A: NEGATIVE
Parainfluenza 1: NEGATIVE
Parainfluenza 2: NEGATIVE
Respiratory Syncytial Virus A: NEGATIVE
Respiratory Syncytial Virus B: NEGATIVE
Rhinovirus: NEGATIVE

## 2015-05-01 LAB — CMV IGM: CMV IgM: <30 AU/mL (ref 0.0–29.9)

## 2015-05-01 LAB — PATHOLOGIST SMEAR REVIEW

## 2015-05-02 LAB — BODY FLUID CULTURE: CULTURE: NO GROWTH

## 2015-05-02 LAB — CMV DNA, QUANTITATIVE, PCR
CMV DNA QUANT: NEGATIVE [IU]/mL
Log10 CMV Qn DNA Pl: UNDETERMINED log10 IU/mL

## 2015-05-03 LAB — CULTURE, BLOOD (ROUTINE X 2)
CULTURE: NO GROWTH
Culture: NO GROWTH

## 2015-06-12 ENCOUNTER — Other Ambulatory Visit: Payer: Self-pay | Admitting: *Deleted

## 2015-06-12 DIAGNOSIS — Z01810 Encounter for preprocedural cardiovascular examination: Secondary | ICD-10-CM

## 2015-06-12 DIAGNOSIS — N186 End stage renal disease: Secondary | ICD-10-CM

## 2015-06-22 ENCOUNTER — Encounter: Payer: Self-pay | Admitting: Surgery

## 2015-07-02 ENCOUNTER — Ambulatory Visit (HOSPITAL_COMMUNITY): Payer: Commercial Managed Care - PPO

## 2015-07-02 ENCOUNTER — Ambulatory Visit: Payer: Commercial Managed Care - PPO | Admitting: Surgery

## 2015-07-20 ENCOUNTER — Other Ambulatory Visit (HOSPITAL_COMMUNITY): Payer: Self-pay | Admitting: Nephrology

## 2015-07-20 DIAGNOSIS — T888XXS Other specified complications of surgical and medical care, not elsewhere classified, sequela: Secondary | ICD-10-CM

## 2015-07-24 ENCOUNTER — Other Ambulatory Visit (HOSPITAL_COMMUNITY): Payer: Self-pay | Admitting: Nephrology

## 2015-07-24 DIAGNOSIS — T888XXS Other specified complications of surgical and medical care, not elsewhere classified, sequela: Secondary | ICD-10-CM

## 2015-09-13 ENCOUNTER — Encounter: Payer: Self-pay | Admitting: Vascular Surgery

## 2015-09-21 ENCOUNTER — Encounter: Payer: Self-pay | Admitting: Vascular Surgery

## 2015-09-21 ENCOUNTER — Ambulatory Visit (HOSPITAL_COMMUNITY)
Admission: RE | Admit: 2015-09-21 | Discharge: 2015-09-21 | Disposition: A | Discharge status: Home / Self Care | Payer: Commercial Managed Care - PPO | Source: Ambulatory Visit | Attending: Vascular Surgery | Admitting: Vascular Surgery

## 2015-09-21 ENCOUNTER — Ambulatory Visit (INDEPENDENT_AMBULATORY_CARE_PROVIDER_SITE_OTHER)
Admission: RE | Admit: 2015-09-21 | Discharge: 2015-09-21 | Disposition: A | Discharge status: Home / Self Care | Payer: Commercial Managed Care - PPO | Source: Ambulatory Visit | Attending: Vascular Surgery | Admitting: Vascular Surgery

## 2015-09-21 ENCOUNTER — Ambulatory Visit (INDEPENDENT_AMBULATORY_CARE_PROVIDER_SITE_OTHER): Payer: Commercial Managed Care - PPO | Admitting: Vascular Surgery

## 2015-09-21 ENCOUNTER — Other Ambulatory Visit: Payer: Self-pay

## 2015-09-21 VITALS — BP 198/86 | HR 62 | Temp 98.1°F | Resp 20 | Ht 65.0 in | Wt 246.0 lb

## 2015-09-21 DIAGNOSIS — E039 Hypothyroidism, unspecified: Secondary | ICD-10-CM | POA: Insufficient documentation

## 2015-09-21 DIAGNOSIS — F329 Major depressive disorder, single episode, unspecified: Secondary | ICD-10-CM | POA: Insufficient documentation

## 2015-09-21 DIAGNOSIS — E7800 Pure hypercholesterolemia, unspecified: Secondary | ICD-10-CM | POA: Insufficient documentation

## 2015-09-21 DIAGNOSIS — N186 End stage renal disease: Secondary | ICD-10-CM

## 2015-09-21 DIAGNOSIS — E1122 Type 2 diabetes mellitus with diabetic chronic kidney disease: Secondary | ICD-10-CM | POA: Insufficient documentation

## 2015-09-21 DIAGNOSIS — Z01810 Encounter for preprocedural cardiovascular examination: Secondary | ICD-10-CM

## 2015-09-21 DIAGNOSIS — Z992 Dependence on renal dialysis: Secondary | ICD-10-CM | POA: Diagnosis not present

## 2015-09-21 DIAGNOSIS — I120 Hypertensive chronic kidney disease with stage 5 chronic kidney disease or end stage renal disease: Secondary | ICD-10-CM | POA: Insufficient documentation

## 2015-09-21 NOTE — Progress Notes (Signed)
Referred by:  Dr. Hyman Hopes  Reason for referral: New access  History of Present Illness  Valerie Perez is a 55 y.o. (May 16, 1960) female who presents for evaluation for permanent access.  The patient is left hand dominant.  The patient has not had previous permanent access procedures.  Previous central venous cannulation procedures include: RIJV TDC.  She ahs had this [facility] since Dec 2016.  The patient has never had a  PPM placed.   Past Medical History:  Diagnosis Date  . Depression   . Diabetes mellitus without complication (HCC)   . High cholesterol   . Hypertension   . Hypothyroid   . Renal disorder     Past Surgical History:  Procedure Laterality Date  . ABDOMINAL HYSTERECTOMY    . KIDNEY TRANSPLANT      Social History   Social History  . Marital status: Married    Spouse name: N/A  . Number of children: N/A  . Years of education: N/A   Occupational History  . Not on file.   Social History Main Topics  . Smoking status: Never Smoker  . Smokeless tobacco: Not on file  . Alcohol use No  . Drug use: No  . Sexual activity: Not on file   Other Topics Concern  . Not on file   Social History Narrative  . No narrative on file    Family History: patient is unable to detail the medical history of his parents   Current Outpatient Prescriptions  Medication Sig Dispense Refill  . acetaminophen (TYLENOL) 500 MG tablet Take 500 mg by mouth every 6 (six) hours as needed for moderate pain.    Marland Kitchen allopurinol (ZYLOPRIM) 300 MG tablet Take 300 mg by mouth daily.    Marland Kitchen amLODipine (NORVASC) 5 MG tablet Take 10 mg by mouth daily.    Marland Kitchen buPROPion (WELLBUTRIN XL) 150 MG 24 hr tablet Take 150 mg by mouth at bedtime.    . calcitRIOL (ROCALTROL) 0.25 MCG capsule Take 3 capsules (0.75 mcg total) by mouth Every Tuesday,Thursday,and Saturday with dialysis.    . DULoxetine (CYMBALTA) 60 MG capsule Take 60 mg by mouth at bedtime.    . fluticasone (FLONASE) 50 MCG/ACT nasal spray Place  1 spray into both nostrils every morning.    . gabapentin (NEURONTIN) 100 MG capsule Take 300 mg by mouth at bedtime.    . insulin glargine (LANTUS) 100 UNIT/ML injection Inject 45 Units into the skin 2 (two) times daily.    . insulin lispro (HUMALOG) 100 UNIT/ML injection Inject 12 Units into the skin 3 (three) times daily before meals. Sliding scale: 12 units with each meal. Add 1 unit every 25 point BG > 150. Ex: BG 175, add 1 unit = 13 unit; BG 200, add 2 unit = 14 unit.    Marland Kitchen labetalol (NORMODYNE) 200 MG tablet Take 100 mg by mouth 2 (two) times daily.    Marland Kitchen levothyroxine (SYNTHROID, LEVOTHROID) 75 MCG tablet Take 1 tablet (75 mcg total) by mouth daily before breakfast. 30 tablet 0  . loratadine (CLARITIN) 10 MG tablet Take 10 mg by mouth every morning.    . montelukast (SINGULAIR) 10 MG tablet Take 10 mg by mouth at bedtime.    . polyethylene glycol (MIRALAX / GLYCOLAX) packet Take 17 g by mouth daily.    . pravastatin (PRAVACHOL) 40 MG tablet Take 40 mg by mouth at bedtime.    . predniSONE (DELTASONE) 5 MG tablet Take 5 mg by mouth  daily with breakfast.    . sevelamer carbonate (RENVELA) 800 MG tablet Take 800 mg by mouth 2 (two) times daily.    . cycloSPORINE modified (NEORAL) 25 MG capsule Take 4 capsules (100 mg total) by mouth 2 (two) times daily. (Patient not taking: Reported on 09/21/2015) 60 capsule 0  . Darbepoetin Alfa (ARANESP) 150 MCG/0.3ML SOSY injection Inject 0.3 mLs (150 mcg total) into the vein every Tuesday with hemodialysis. (Patient not taking: Reported on 09/21/2015) 1.68 mL   . mycophenolate (MYFORTIC) 180 MG EC tablet Take 1 tablet (180 mg total) by mouth 2 (two) times daily. (Patient not taking: Reported on 09/21/2015) 60 tablet 0   No current facility-administered medications for this visit.     Allergies  Allergen Reactions  . Tape Other (See Comments)    Tears skin  . 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    REVIEW OF SYSTEMS:   (Positives checked otherwise negative)  CARDIOVASCULAR:   [ ]  chest pain,  [ ]  chest pressure,  [ ]  palpitations,  [ ]  shortness of breath when laying flat,  [ ]  shortness of breath with exertion,   [x]  pain in feet when walking,  [ ]  pain in feet when laying flat, [ ]  history of blood clot in veins (DVT),  [ ]  history of phlebitis,  [ ]  swelling in legs,  [ ]  varicose veins  PULMONARY:   [ ]  productive cough,  [ ]  asthma,  [ ]  wheezing  NEUROLOGIC:   [ ]  weakness in arms or legs,  [ ]  numbness in arms or legs,  [ ]  difficulty speaking or slurred speech,  [ ]  temporary loss of vision in one eye,  [ ]  dizziness  HEMATOLOGIC:   [ ]  bleeding problems,  [ ]  problems with blood clotting too easily  MUSCULOSKEL:   [ ]  joint pain, [ ]  joint swelling  GASTROINTEST:   [ ]  vomiting blood,  [ ]  blood in stool     GENITOURINARY:   [ ]  burning with urination,  [ ]  blood in urine  PSYCHIATRIC:   [ ]  history of major depression  INTEGUMENTARY:   [ ]  rashes,  [ ]  ulcers  CONSTITUTIONAL:   [ ]  fever,  [ ]  chills   Physical Examination  Vitals:   09/21/15 1135  BP: (!) 198/86  Pulse: 62  Resp: 20  Temp: 98.1 F (36.7 C)  SpO2: 98%  Weight: 246 lb (111.6 kg)  Height: 5\' 5"  (1.651 m)   Body mass index is 40.94 kg/m.  General: A&O x 3, WD, morbidly Obese,   Head: Turner/AT  Ear/Nose/Throat: Hearing grossly intact, nares w/o erythema or drainage, oropharynx w/o Erythema/Exudate, Mallampati score: 3  Eyes: PERRLA, EOMI  Neck: Supple, no nuchal rigidity, no palpable LAD  Pulmonary: Sym exp, good air movt, CTAB, no rales, rhonchi, & wheezing  Cardiac: RRR, Nl S1, S2, no Murmurs, rubs or gallops  Vascular: Vessel Right Left  Radial Palpable Palpable  Ulnar Faintly palpable Faintly palpable  Brachial Palpable Palpable  Carotid Palpable, without bruit Palpable, without bruit  Aorta Not palpable N/A  Femoral Palpable Palpable  Popliteal Not palpable Not  palpable  PT Palpable Palpable  DP Palpable Palpable   Gastrointestinal: soft, NTND, -G/R, - HSM, - masses, - CVAT B, large pannus, unable to palpate aorta  Musculoskeletal: M/S 5/5 throughout , Extremities without ischemic changes   Neurologic: CN 2-12 intact , Pain and light touch intact  in extremities , Motor exam as listed above  Psychiatric: Judgment intact, Mood & affect appropriate for pt's clinical situation  Dermatologic: See M/S exam for extremity exam, no rashes otherwise noted  Lymph : No Cervical, Axillary, or Inguinal lymphadenopathy    Non-Invasive Vascular Imaging  Vein Mapping  (Date: 09/21/2015):   R arm: acceptable vein conduits include entire cephalic, likely entire basilic  L arm: acceptable vein conduits include upper arm cephalic and basilic  BUE Doppler (Date: 09/21/2015):   R arm:   Brachial: tri, 4.9 mm  Radial: tri, 2.1 mm  Ulnar: tri, 1.7 mm  L arm:   Brachial: tri, 4.4 mm  Radial: tri ,1.8 mm  Ulnar: tri, 2.2 mm   Outside Studies/Documentation 10 pages of outside documents were reviewed including: outpatient nephrology chart.   Medical Decision Making  Valerie Perez is a 55 y.o. female who presents with ESRD requiring hemodialysis (T/R/S).    Based on vein mapping and examination, this patient's permanent access options include: R BC AVF, R staged BVT, L BC AVF, L staged BVT I have offered the aptient R BC AVF.  Risk, benefits, and alternatives to access surgery were discussed.   The patient is aware the risks include but are not limited to: bleeding, infection, steal syndrome, nerve damage, ischemic monomelic neuropathy, failure to mature, need for additional procedures, death and stroke.   The patient agrees to proceed forward with the procedure.  She is scheduled for 9 AUG 17.   Jahki Witham, MD [facility] [place] Office: [phone] Pager: [phone]  09/21/2015, 12:17 PM    

## 2015-10-01 ENCOUNTER — Encounter (HOSPITAL_COMMUNITY): Payer: Self-pay | Admitting: *Deleted

## 2015-10-01 NOTE — Progress Notes (Addendum)
Pt denies cardiac history, chest pain or sob. States she was recently told she had a heart murmur, she states it was the first time and only time she was told that. Pt is diabetic. States her fasting blood sugar usually ranges between 60-80's. States last week she had an episode where her blood sugar dropped to 33 and she was unresponsive and EMS was called. She states she had not eaten much the day before and thinks that's why her blood sugar was low. Instructed pt to take 22 units of Lantus Tuesday PM (and she states Dr. Nicky Pughhen's office told her not to take any the day of surgery). I instructed her to check her blood sugar the morning of surgery. If blood sugar is >220 take 1/2 of usual correction dose of Humalog insulin. If blood sugar is 70 or below, treat with 1/2 cup of clear juice (apple or cranberry) and recheck blood sugar 15 minutes after drinking juice. If blood sugar continues to be 70 or below, call the Short Stay department and ask to speak to a nurse. She voiced  Understanding.

## 2015-10-02 MED ORDER — DEXTROSE 5 % IV SOLN
1.5000 g | INTRAVENOUS | Status: AC
  Administered 2015-10-03: 1.5 g via INTRAVENOUS
  Filled 2015-10-02: qty 1.5

## 2015-10-03 ENCOUNTER — Ambulatory Visit (HOSPITAL_COMMUNITY)
Admission: RE | Admit: 2015-10-03 | Discharge: 2015-10-03 | Disposition: A | Discharge status: Home / Self Care | Payer: Commercial Managed Care - PPO | Source: Ambulatory Visit | Attending: Vascular Surgery | Admitting: Vascular Surgery

## 2015-10-03 ENCOUNTER — Ambulatory Visit (HOSPITAL_COMMUNITY): Payer: Commercial Managed Care - PPO | Admitting: Anesthesiology

## 2015-10-03 ENCOUNTER — Encounter (HOSPITAL_COMMUNITY)
Admission: RE | Disposition: A | Discharge status: Home / Self Care | Payer: Self-pay | Source: Ambulatory Visit | Attending: Vascular Surgery

## 2015-10-03 DIAGNOSIS — I120 Hypertensive chronic kidney disease with stage 5 chronic kidney disease or end stage renal disease: Secondary | ICD-10-CM | POA: Insufficient documentation

## 2015-10-03 DIAGNOSIS — N186 End stage renal disease: Secondary | ICD-10-CM | POA: Insufficient documentation

## 2015-10-03 DIAGNOSIS — F329 Major depressive disorder, single episode, unspecified: Secondary | ICD-10-CM | POA: Insufficient documentation

## 2015-10-03 DIAGNOSIS — Z794 Long term (current) use of insulin: Secondary | ICD-10-CM | POA: Diagnosis not present

## 2015-10-03 DIAGNOSIS — E7800 Pure hypercholesterolemia, unspecified: Secondary | ICD-10-CM | POA: Insufficient documentation

## 2015-10-03 DIAGNOSIS — G4730 Sleep apnea, unspecified: Secondary | ICD-10-CM | POA: Diagnosis not present

## 2015-10-03 DIAGNOSIS — E1122 Type 2 diabetes mellitus with diabetic chronic kidney disease: Secondary | ICD-10-CM | POA: Insufficient documentation

## 2015-10-03 DIAGNOSIS — Z940 Kidney transplant status: Secondary | ICD-10-CM | POA: Diagnosis not present

## 2015-10-03 DIAGNOSIS — E039 Hypothyroidism, unspecified: Secondary | ICD-10-CM | POA: Diagnosis not present

## 2015-10-03 DIAGNOSIS — Z6841 Body Mass Index (BMI) 40.0 and over, adult: Secondary | ICD-10-CM | POA: Diagnosis not present

## 2015-10-03 DIAGNOSIS — Z992 Dependence on renal dialysis: Secondary | ICD-10-CM | POA: Diagnosis not present

## 2015-10-03 HISTORY — DX: Polyneuropathy, unspecified: G62.9

## 2015-10-03 HISTORY — DX: Pneumonia, unspecified organism: J18.9

## 2015-10-03 HISTORY — DX: Cardiac murmur, unspecified: R01.1

## 2015-10-03 HISTORY — DX: Fatty (change of) liver, not elsewhere classified: K76.0

## 2015-10-03 HISTORY — DX: Sleep apnea, unspecified: G47.30

## 2015-10-03 HISTORY — DX: Other specified postprocedural states: R11.2

## 2015-10-03 HISTORY — DX: Anemia, unspecified: D64.9

## 2015-10-03 HISTORY — DX: Other specified postprocedural states: Z98.890

## 2015-10-03 HISTORY — DX: Unspecified osteoarthritis, unspecified site: M19.90

## 2015-10-03 HISTORY — PX: AV FISTULA PLACEMENT: SHX1204

## 2015-10-03 LAB — GLUCOSE, CAPILLARY
GLUCOSE-CAPILLARY: 145 mg/dL — AB (ref 65–99)
GLUCOSE-CAPILLARY: 58 mg/dL — AB (ref 65–99)
Glucose-Capillary: 60 mg/dL — ABNORMAL LOW (ref 65–99)
Glucose-Capillary: 80 mg/dL (ref 65–99)

## 2015-10-03 LAB — POCT I-STAT 4, (NA,K, GLUC, HGB,HCT)
GLUCOSE: 55 mg/dL — AB (ref 65–99)
HCT: 27 % — ABNORMAL LOW (ref 36.0–46.0)
Hemoglobin: 9.2 g/dL — ABNORMAL LOW (ref 12.0–15.0)
Potassium: 5.1 mmol/L (ref 3.5–5.1)
Sodium: 140 mmol/L (ref 135–145)

## 2015-10-03 SURGERY — ARTERIOVENOUS (AV) FISTULA CREATION
Anesthesia: Monitor Anesthesia Care | Site: Arm Upper | Laterality: Right

## 2015-10-03 MED ORDER — LIDOCAINE HCL (PF) 1 % IJ SOLN
INTRAMUSCULAR | Status: AC
  Filled 2015-10-03: qty 30

## 2015-10-03 MED ORDER — DIPHENHYDRAMINE HCL 50 MG/ML IJ SOLN
INTRAMUSCULAR | Status: DC | PRN
  Administered 2015-10-03: 25 mg via INTRAVENOUS

## 2015-10-03 MED ORDER — DEXTROSE 50 % IV SOLN
INTRAVENOUS | Status: AC
  Administered 2015-10-03: 50 mL
  Filled 2015-10-03: qty 50

## 2015-10-03 MED ORDER — OXYCODONE-ACETAMINOPHEN 5-325 MG PO TABS: 1.0000 | ORAL_TABLET | Freq: Four times a day (QID) | ORAL | 0 refills | Status: DC | PRN

## 2015-10-03 MED ORDER — 0.9 % SODIUM CHLORIDE (POUR BTL) OPTIME
TOPICAL | Status: DC | PRN
  Administered 2015-10-03: 1000 mL

## 2015-10-03 MED ORDER — PROPOFOL 10 MG/ML IV BOLUS
INTRAVENOUS | Status: AC
  Filled 2015-10-03: qty 20

## 2015-10-03 MED ORDER — LIDOCAINE HCL (CARDIAC) 20 MG/ML IV SOLN
INTRAVENOUS | Status: DC | PRN
  Administered 2015-10-03: 40 mg via INTRATRACHEAL

## 2015-10-03 MED ORDER — LIDOCAINE HCL (PF) 1 % IJ SOLN
INTRAMUSCULAR | Status: DC | PRN
  Administered 2015-10-03: 30 mL

## 2015-10-03 MED ORDER — SODIUM CHLORIDE 0.9 % IV SOLN: INTRAVENOUS | Status: DC

## 2015-10-03 MED ORDER — PROPOFOL 500 MG/50ML IV EMUL
INTRAVENOUS | Status: DC | PRN
  Administered 2015-10-03: 25 ug/kg/min via INTRAVENOUS

## 2015-10-03 MED ORDER — SODIUM CHLORIDE 0.9 % IV SOLN
INTRAVENOUS | Status: DC
  Administered 2015-10-03 (×2): via INTRAVENOUS

## 2015-10-03 MED ORDER — SODIUM CHLORIDE 0.9 % IV SOLN
INTRAVENOUS | Status: DC | PRN
  Administered 2015-10-03: 500 mL

## 2015-10-03 MED ORDER — MIDAZOLAM HCL 5 MG/5ML IJ SOLN
INTRAMUSCULAR | Status: DC | PRN
  Administered 2015-10-03: 2 mg via INTRAVENOUS

## 2015-10-03 MED ORDER — MIDAZOLAM HCL 2 MG/2ML IJ SOLN
INTRAMUSCULAR | Status: AC
  Filled 2015-10-03: qty 2

## 2015-10-03 MED ORDER — FENTANYL CITRATE (PF) 250 MCG/5ML IJ SOLN
INTRAMUSCULAR | Status: DC | PRN
  Administered 2015-10-03 (×2): 50 ug via INTRAVENOUS

## 2015-10-03 MED ORDER — FENTANYL CITRATE (PF) 250 MCG/5ML IJ SOLN
INTRAMUSCULAR | Status: AC
  Filled 2015-10-03: qty 5

## 2015-10-03 MED ORDER — PROPOFOL 500 MG/50ML IV EMUL: INTRAVENOUS | Status: DC | PRN

## 2015-10-03 MED ORDER — CHLORHEXIDINE GLUCONATE CLOTH 2 % EX PADS: 6.0000 | MEDICATED_PAD | Freq: Once | CUTANEOUS | Status: DC

## 2015-10-03 SURGICAL SUPPLY — 29 items
ARMBAND PINK RESTRICT EXTREMIT (MISCELLANEOUS) ×6 IMPLANT
CANISTER SUCTION 2500CC (MISCELLANEOUS) ×3 IMPLANT
CLIP TI MEDIUM 6 (CLIP) ×3 IMPLANT
CLIP TI WIDE RED SMALL 6 (CLIP) ×6 IMPLANT
COVER PROBE W GEL 5X96 (DRAPES) ×3 IMPLANT
DECANTER SPIKE VIAL GLASS SM (MISCELLANEOUS) ×3 IMPLANT
ELECT REM PT RETURN 9FT ADLT (ELECTROSURGICAL) ×3
ELECTRODE REM PT RTRN 9FT ADLT (ELECTROSURGICAL) ×1 IMPLANT
GLOVE BIO SURGEON STRL SZ 6.5 (GLOVE) ×8 IMPLANT
GLOVE BIO SURGEON STRL SZ7 (GLOVE) ×3 IMPLANT
GLOVE BIO SURGEONS STRL SZ 6.5 (GLOVE) ×4
GLOVE BIOGEL PI IND STRL 7.5 (GLOVE) ×1 IMPLANT
GLOVE BIOGEL PI INDICATOR 7.5 (GLOVE) ×2
GOWN STRL REUS W/ TWL LRG LVL3 (GOWN DISPOSABLE) ×3 IMPLANT
GOWN STRL REUS W/TWL LRG LVL3 (GOWN DISPOSABLE) ×9
HEMOSTAT SPONGE AVITENE ULTRA (HEMOSTASIS) IMPLANT
KIT BASIN OR (CUSTOM PROCEDURE TRAY) ×3 IMPLANT
KIT ROOM TURNOVER OR (KITS) ×3 IMPLANT
LIQUID BAND (GAUZE/BANDAGES/DRESSINGS) ×3 IMPLANT
NS IRRIG 1000ML POUR BTL (IV SOLUTION) ×3 IMPLANT
PACK CV ACCESS (CUSTOM PROCEDURE TRAY) ×3 IMPLANT
PAD ARMBOARD 7.5X6 YLW CONV (MISCELLANEOUS) ×6 IMPLANT
SUT MNCRL AB 4-0 PS2 18 (SUTURE) ×3 IMPLANT
SUT PROLENE 6 0 BV (SUTURE) IMPLANT
SUT PROLENE 7 0 BV 1 (SUTURE) ×6 IMPLANT
SUT VIC AB 3-0 SH 27 (SUTURE) ×2
SUT VIC AB 3-0 SH 27X BRD (SUTURE) ×1 IMPLANT
UNDERPAD 30X30 INCONTINENT (UNDERPADS AND DIAPERS) ×3 IMPLANT
WATER STERILE IRR 1000ML POUR (IV SOLUTION) ×3 IMPLANT

## 2015-10-03 NOTE — H&P (View-Only) (Signed)
Referred by:  Dr. Hyman Hopes  Reason for referral: New access  History of Present Illness  Valerie Perez is a 55 y.o. (May 16, 1960) female who presents for evaluation for permanent access.  The patient is left hand dominant.  The patient has not had previous permanent access procedures.  Previous central venous cannulation procedures include: RIJV TDC.  She ahs had this [facility] since Dec 2016.  The patient has never had a  PPM placed.   Past Medical History:  Diagnosis Date  . Depression   . Diabetes mellitus without complication (HCC)   . High cholesterol   . Hypertension   . Hypothyroid   . Renal disorder     Past Surgical History:  Procedure Laterality Date  . ABDOMINAL HYSTERECTOMY    . KIDNEY TRANSPLANT      Social History   Social History  . Marital status: Married    Spouse name: N/A  . Number of children: N/A  . Years of education: N/A   Occupational History  . Not on file.   Social History Main Topics  . Smoking status: Never Smoker  . Smokeless tobacco: Not on file  . Alcohol use No  . Drug use: No  . Sexual activity: Not on file   Other Topics Concern  . Not on file   Social History Narrative  . No narrative on file    Family History: patient is unable to detail the medical history of his parents   Current Outpatient Prescriptions  Medication Sig Dispense Refill  . acetaminophen (TYLENOL) 500 MG tablet Take 500 mg by mouth every 6 (six) hours as needed for moderate pain.    Marland Kitchen allopurinol (ZYLOPRIM) 300 MG tablet Take 300 mg by mouth daily.    Marland Kitchen amLODipine (NORVASC) 5 MG tablet Take 10 mg by mouth daily.    Marland Kitchen buPROPion (WELLBUTRIN XL) 150 MG 24 hr tablet Take 150 mg by mouth at bedtime.    . calcitRIOL (ROCALTROL) 0.25 MCG capsule Take 3 capsules (0.75 mcg total) by mouth Every Tuesday,Thursday,and Saturday with dialysis.    . DULoxetine (CYMBALTA) 60 MG capsule Take 60 mg by mouth at bedtime.    . fluticasone (FLONASE) 50 MCG/ACT nasal spray Place  1 spray into both nostrils every morning.    . gabapentin (NEURONTIN) 100 MG capsule Take 300 mg by mouth at bedtime.    . insulin glargine (LANTUS) 100 UNIT/ML injection Inject 45 Units into the skin 2 (two) times daily.    . insulin lispro (HUMALOG) 100 UNIT/ML injection Inject 12 Units into the skin 3 (three) times daily before meals. Sliding scale: 12 units with each meal. Add 1 unit every 25 point BG > 150. Ex: BG 175, add 1 unit = 13 unit; BG 200, add 2 unit = 14 unit.    Marland Kitchen labetalol (NORMODYNE) 200 MG tablet Take 100 mg by mouth 2 (two) times daily.    Marland Kitchen levothyroxine (SYNTHROID, LEVOTHROID) 75 MCG tablet Take 1 tablet (75 mcg total) by mouth daily before breakfast. 30 tablet 0  . loratadine (CLARITIN) 10 MG tablet Take 10 mg by mouth every morning.    . montelukast (SINGULAIR) 10 MG tablet Take 10 mg by mouth at bedtime.    . polyethylene glycol (MIRALAX / GLYCOLAX) packet Take 17 g by mouth daily.    . pravastatin (PRAVACHOL) 40 MG tablet Take 40 mg by mouth at bedtime.    . predniSONE (DELTASONE) 5 MG tablet Take 5 mg by mouth  daily with breakfast.    . sevelamer carbonate (RENVELA) 800 MG tablet Take 800 mg by mouth 2 (two) times daily.    . cycloSPORINE modified (NEORAL) 25 MG capsule Take 4 capsules (100 mg total) by mouth 2 (two) times daily. (Patient not taking: Reported on 09/21/2015) 60 capsule 0  . Darbepoetin Alfa (ARANESP) 150 MCG/0.3ML SOSY injection Inject 0.3 mLs (150 mcg total) into the vein every Tuesday with hemodialysis. (Patient not taking: Reported on 09/21/2015) 1.68 mL   . mycophenolate (MYFORTIC) 180 MG EC tablet Take 1 tablet (180 mg total) by mouth 2 (two) times daily. (Patient not taking: Reported on 09/21/2015) 60 tablet 0   No current facility-administered medications for this visit.     Allergies  Allergen Reactions  . Tape Other (See Comments)    Tears skin  . 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    REVIEW OF SYSTEMS:   (Positives checked otherwise negative)  CARDIOVASCULAR:   [ ]  chest pain,  [ ]  chest pressure,  [ ]  palpitations,  [ ]  shortness of breath when laying flat,  [ ]  shortness of breath with exertion,   [x]  pain in feet when walking,  [ ]  pain in feet when laying flat, [ ]  history of blood clot in veins (DVT),  [ ]  history of phlebitis,  [ ]  swelling in legs,  [ ]  varicose veins  PULMONARY:   [ ]  productive cough,  [ ]  asthma,  [ ]  wheezing  NEUROLOGIC:   [ ]  weakness in arms or legs,  [ ]  numbness in arms or legs,  [ ]  difficulty speaking or slurred speech,  [ ]  temporary loss of vision in one eye,  [ ]  dizziness  HEMATOLOGIC:   [ ]  bleeding problems,  [ ]  problems with blood clotting too easily  MUSCULOSKEL:   [ ]  joint pain, [ ]  joint swelling  GASTROINTEST:   [ ]  vomiting blood,  [ ]  blood in stool     GENITOURINARY:   [ ]  burning with urination,  [ ]  blood in urine  PSYCHIATRIC:   [ ]  history of major depression  INTEGUMENTARY:   [ ]  rashes,  [ ]  ulcers  CONSTITUTIONAL:   [ ]  fever,  [ ]  chills   Physical Examination  Vitals:   09/21/15 1135  BP: (!) 198/86  Pulse: 62  Resp: 20  Temp: 98.1 F (36.7 C)  SpO2: 98%  Weight: 246 lb (111.6 kg)  Height: 5\' 5"  (1.651 m)   Body mass index is 40.94 kg/m.  General: A&O x 3, WD, morbidly Obese,   Head: Turner/AT  Ear/Nose/Throat: Hearing grossly intact, nares w/o erythema or drainage, oropharynx w/o Erythema/Exudate, Mallampati score: 3  Eyes: PERRLA, EOMI  Neck: Supple, no nuchal rigidity, no palpable LAD  Pulmonary: Sym exp, good air movt, CTAB, no rales, rhonchi, & wheezing  Cardiac: RRR, Nl S1, S2, no Murmurs, rubs or gallops  Vascular: Vessel Right Left  Radial Palpable Palpable  Ulnar Faintly palpable Faintly palpable  Brachial Palpable Palpable  Carotid Palpable, without bruit Palpable, without bruit  Aorta Not palpable N/A  Femoral Palpable Palpable  Popliteal Not palpable Not  palpable  PT Palpable Palpable  DP Palpable Palpable   Gastrointestinal: soft, NTND, -G/R, - HSM, - masses, - CVAT B, large pannus, unable to palpate aorta  Musculoskeletal: M/S 5/5 throughout , Extremities without ischemic changes   Neurologic: CN 2-12 intact , Pain and light touch intact  in extremities , Motor exam as listed above  Psychiatric: Judgment intact, Mood & affect appropriate for pt's clinical situation  Dermatologic: See M/S exam for extremity exam, no rashes otherwise noted  Lymph : No Cervical, Axillary, or Inguinal lymphadenopathy    Non-Invasive Vascular Imaging  Vein Mapping  (Date: 09/21/2015):   R arm: acceptable vein conduits include entire cephalic, likely entire basilic  L arm: acceptable vein conduits include upper arm cephalic and basilic  BUE Doppler (Date: 09/21/2015):   R arm:   Brachial: tri, 4.9 mm  Radial: tri, 2.1 mm  Ulnar: tri, 1.7 mm  L arm:   Brachial: tri, 4.4 mm  Radial: tri ,1.8 mm  Ulnar: tri, 2.2 mm   Outside Studies/Documentation 10 pages of outside documents were reviewed including: outpatient nephrology chart.   Medical Decision Making  LOLETTA HARPER is a 55 y.o. female who presents with ESRD requiring hemodialysis (T/R/S).    Based on vein mapping and examination, this patient's permanent access options include: R BC AVF, R staged BVT, L BC AVF, L staged BVT I have offered the aptient R BC AVF.  Risk, benefits, and alternatives to access surgery were discussed.   The patient is aware the risks include but are not limited to: bleeding, infection, steal syndrome, nerve damage, ischemic monomelic neuropathy, failure to mature, need for additional procedures, death and stroke.   The patient agrees to proceed forward with the procedure.  She is scheduled for 9 AUG 17.   Leonides Sake, MD [facility] [place] Office: [phone] Pager: [phone]  09/21/2015, 12:17 PM

## 2015-10-03 NOTE — Anesthesia Preprocedure Evaluation (Signed)
Anesthesia Evaluation  Patient identified by MRN, date of birth, ID band Patient awake    Reviewed: Allergy & Precautions, NPO status , Patient's Chart, lab work & pertinent test results  Airway Mallampati: II  TM Distance: >3 FB Neck ROM: Full    Dental no notable dental hx.    Pulmonary sleep apnea ,    Pulmonary exam normal breath sounds clear to auscultation       Cardiovascular hypertension, Normal cardiovascular exam Rhythm:Regular Rate:Normal     Neuro/Psych negative neurological ROS  negative psych ROS   GI/Hepatic negative GI ROS, Neg liver ROS,   Endo/Other  diabetesHypothyroidism   Renal/GU DialysisRenal disease  negative genitourinary   Musculoskeletal negative musculoskeletal ROS (+)   Abdominal   Peds negative pediatric ROS (+)  Hematology  (+) anemia ,   Anesthesia Other Findings   Reproductive/Obstetrics negative OB ROS                             Anesthesia Physical Anesthesia Plan  ASA: III  Anesthesia Plan: MAC   Post-op Pain Management:    Induction: Intravenous  Airway Management Planned: Simple Face Mask  Additional Equipment:   Intra-op Plan:   Post-operative Plan: Extubation in OR  Informed Consent: I have reviewed the patients History and Physical, chart, labs and discussed the procedure including the risks, benefits and alternatives for the proposed anesthesia with the patient or authorized representative who has indicated his/her understanding and acceptance.   Dental advisory given  Plan Discussed with: CRNA and Surgeon  Anesthesia Plan Comments:         Anesthesia Quick Evaluation

## 2015-10-03 NOTE — Transfer of Care (Signed)
Immediate Anesthesia Transfer of Care Note  Patient: Valerie Perez  Procedure(s) Performed: Procedure(s): CREATION-RIGHT BRACHIOCEPHALIC ARTERIOVENOUS (AV) FISTULA (Right)  Patient Location: PACU  Anesthesia Type:MAC  Level of Consciousness: awake, alert  and oriented  Airway & Oxygen Therapy: Patient Spontanous Breathing and Patient connected to nasal cannula oxygen  Post-op Assessment: Report given to RN, Post -op Vital signs reviewed and stable and Patient moving all extremities X 4  Post vital signs: Reviewed and stable  Last Vitals:  Vitals:   10/03/15 0656  BP: (!) 133/52  Pulse: (!) 58  Resp: 20  Temp: 36.8 C    Last Pain:  Vitals:   10/03/15 0656  TempSrc: Oral      Patients Stated Pain Goal: 3 (10/03/15 0814)  Complications: No apparent anesthesia complications

## 2015-10-03 NOTE — Progress Notes (Signed)
cbg of 60 reported to Dr Okey Dupreose. Order received to give full amp of D50 IV given.

## 2015-10-03 NOTE — Anesthesia Procedure Notes (Signed)
Procedure Name: MAC Date/Time: 10/03/2015 8:34 AM Performed by: Marena ChancyBECKNER, Uniqua Kihn S Pre-anesthesia Checklist: Patient identified, Emergency Drugs available, Suction available, Patient being monitored and Timeout performed Patient Re-evaluated:Patient Re-evaluated prior to inductionOxygen Delivery Method: Nasal cannula

## 2015-10-03 NOTE — Anesthesia Postprocedure Evaluation (Signed)
Anesthesia Post Note  Patient: Renella CunasKatherine C Kretzschmar  Procedure(s) Performed: Procedure(s) (LRB): CREATION-RIGHT BRACHIOCEPHALIC ARTERIOVENOUS (AV) FISTULA (Right)  Patient location during evaluation: PACU Anesthesia Type: MAC Level of consciousness: awake and alert Pain management: pain level controlled Vital Signs Assessment: post-procedure vital signs reviewed and stable Respiratory status: spontaneous breathing, nonlabored ventilation, respiratory function stable and patient connected to nasal cannula oxygen Cardiovascular status: blood pressure returned to baseline and stable Postop Assessment: no signs of nausea or vomiting Anesthetic complications: no    Last Vitals:  Vitals:   10/03/15 0656 10/03/15 1007  BP: (!) 133/52 139/68  Pulse: (!) 58 62  Resp: 20 (!) 22  Temp: 36.8 C 36.7 C    Last Pain:  Vitals:   10/03/15 0656  TempSrc: Oral                 Hali Balgobin S

## 2015-10-03 NOTE — Discharge Instructions (Signed)
° ° °  10/03/2015 [name] [id number] [birth year]  Surgeon(s): Fransisco HertzBrian L Chen, MD  Procedure(s): CREATION-RIGHT BRACHIOCEPHALIC ARTERIOVENOUS (AV) FISTULA  x Do not stick fistula for 12 weeks

## 2015-10-03 NOTE — Interval H&P Note (Signed)
History and Physical Interval Note:  10/03/2015 8:21 AM  Valerie Perez  has presented today for surgery, with the diagnosis of End Stage Renal Disease N18.6  The various methods of treatment have been discussed with the patient and family. After consideration of risks, benefits and other options for treatment, the patient has consented to  Procedure(s): ARTERIOVENOUS (AV) FISTULA CREATION-RIGHT BRACHIOCEPHALIC (Right) as a surgical intervention .  The patient's history has been reviewed, patient examined, no change in status, stable for surgery.  I have reviewed the patient's chart and labs.  Questions were answered to the patient's satisfaction.     Leonides Sakehen, Brian

## 2015-10-03 NOTE — Op Note (Signed)
OPERATIVE NOTE   PROCEDURE: right brachiocephalic arteriovenous fistula placement  PRE-OPERATIVE DIAGNOSIS: end stage renal disease   POST-OPERATIVE DIAGNOSIS: same as above   SURGEON: Leonides SakeBrian Kirti Carl, MD  ASSISTANT(S): Doreatha MassedSamantha Rhyne, PAC   ANESTHESIA: local and MAC  ESTIMATED BLOOD LOSS: 50 cc  FINDING(S): 1.  Palpable thrill at end of case 2.  Faintly palpable left radial pulse  SPECIMEN(S):  none  INDICATIONS:   Valerie Perez is a 55 y.o. female who presents with end stage renal disease.  The patient is scheduled for right brachiocephalic arteriovenous fistula placement.  The patient is aware the risks include but are not limited to: bleeding, infection, steal syndrome, nerve damage, ischemic monomelic neuropathy, failure to mature, and need for additional procedures.  The patient is aware of the risks of the procedure and elects to proceed forward.  DESCRIPTION: After full informed written consent was obtained from the patient, the patient was brought back to the operating room and placed supine upon the operating table.  Prior to induction, the patient received IV antibiotics.   After obtaining adequate anesthesia, the patient was then prepped and draped in the standard fashion for a right arm access procedure.  I turned my attention first to identifying the patient's cephalic vein and brachial artery.  Using SonoSite guidance, the location of these vessels were marked out on the skin.   At this point, I injected local anesthetic to obtain a field block of the antecubitum.  In total, I injected about 5 mL of a 1% lidocaine without epinephrine.  I made a transverse incision at the level of the antecubitum and dissected through the subcutaneous tissue and fascia to gain exposure of the brachial artery.  This was noted to be 4 mm in diameter externally.  This was dissected out proximally and distally and controlled with vessel loops .  I then dissected out the cephalic vein.  This  was noted to be 3 mm in diameter externally.  The distal segment of the vein was ligated with a  2-0 silk, and the vein was transected.  The proximal segment was interrogated with serial dilators.  The vein accepted up to a 4 mm dilator without any difficulty.  I then instilled the heparinized saline into the vein and clamped it.  At this point, I reset my exposure of the brachial artery and placed the artery under tension proximally and distally.  I made an arteriotomy with a #11 blade, and then I extended the arteriotomy with a Potts scissor.  I injected heparinized saline proximal and distal to this arteriotomy.  The vein was then sewn to the artery in an end-to-side configuration with a running stitch of 7-0 Prolene.  Prior to completing this anastomosis, I allowed the vein and artery to backbleed.  There was no evidence of clot from any vessels.  I completed the anastomosis in the usual fashion and then released all vessel loops and clamps.  There was a palpable thrill in the venous outflow, and there was a faintly palpable radial pulse.  At this point, I irrigated out the surgical wound.  There was no further active bleeding.  The subcutaneous tissue was reapproximated with a running stitch of 3-0 Vicryl.  The skin was then reapproximated with a running subcuticular stitch of 4-0 Vicryl.  The skin was then cleaned, dried, and reinforced with Dermabond.  The patient tolerated this procedure well.    COMPLICATIONS: none  CONDITION: stable   Leonides SakeBrian Shamela Haydon, MD [facility] [place] Office: [phone] Pager: [phone]  10/03/2015, 9:55 AM

## 2015-10-04 ENCOUNTER — Telehealth: Payer: Self-pay | Admitting: Vascular Surgery

## 2015-10-04 ENCOUNTER — Encounter (HOSPITAL_COMMUNITY): Payer: Self-pay | Admitting: Vascular Surgery

## 2015-10-04 NOTE — Telephone Encounter (Signed)
-----   Message from Sharee PimpleMarilyn K McChesney, RN sent at 10/03/2015  9:55 AM EDT ----- Regarding: schedule 4-6 weeks   ----- Message ----- From: Dara LordsSamantha J Rhyne, PA-C Sent: 10/03/2015   9:44 AM To: Vvs Charge Pool  S/p right BC AVF 10/03/15.  F/u with Dr. Imogene Burnhen in 4-6 weeks.  No duplex.  Thanks, Lelon MastSamantha

## 2015-10-04 NOTE — Telephone Encounter (Signed)
Sched appt 8/22 at 9:15. Lm on hm# to inform pt.

## 2015-10-05 ENCOUNTER — Ambulatory Visit (INDEPENDENT_AMBULATORY_CARE_PROVIDER_SITE_OTHER): Payer: Self-pay | Admitting: Family

## 2015-10-05 ENCOUNTER — Encounter: Payer: Self-pay | Admitting: Family

## 2015-10-05 VITALS — BP 170/80 | HR 64 | Temp 98.4°F | Ht 65.0 in | Wt 230.0 lb

## 2015-10-05 DIAGNOSIS — R200 Anesthesia of skin: Secondary | ICD-10-CM

## 2015-10-05 DIAGNOSIS — R202 Paresthesia of skin: Secondary | ICD-10-CM

## 2015-10-05 DIAGNOSIS — Z992 Dependence on renal dialysis: Secondary | ICD-10-CM

## 2015-10-05 DIAGNOSIS — I770 Arteriovenous fistula, acquired: Secondary | ICD-10-CM

## 2015-10-05 DIAGNOSIS — N186 End stage renal disease: Secondary | ICD-10-CM

## 2015-10-05 NOTE — Progress Notes (Signed)
    Postoperative Access Visit   History of Present Illness  Valerie Perez is a 55 y.o. year old female who is s/p right brachiocephalic arteriovenous fistula placement on 10/03/15 by Dr. Imogene Burnhen for end stage renal disease, anticipating the need for hemodialysis.  She returns today with c/o tingling and numbness in her right hand in the morning, resolves after she gets out of bed, less often happens during the day. She denies fever or chills.   The patient has not had previous permanent access procedures prior to this one.  Previous central venous cannulation procedures include: RIJV TDC.  She has had this [facility]DC since Dec 2016, and dialyzes T-TH-SAT.  The patient is able to complete their activities of daily living.    For VQI Use Only  PRE-ADM LIVING: Home  AMB STATUS: Ambulatory  Physical Examination Vitals:   10/05/15 1044  BP: (!) 162/77  Pulse: 64  SpO2: 100%  Weight: 230 lb (104.3 kg)  Height: 5\' 5"  (1.651 m)   Body mass index is 38.27 kg/m.  Right antecubital incision is healing with no erythema, minimal swelling, no drainage, skin feels normal and warm, hand grip is 5/5, sensation in digits is intact, no palpable thrill, bruit can be auscultated   Medical Decision Making  Valerie Perez is a 55 y.o. year old female who presents s/p right brachiocephalic arteriovenous fistula placement on 10/03/15. She c/o tingling and numbness in her right hand in the morning which mostly resolves after she gets out of bed.  She wanted this evaluated before the week-end. She has no other complaints. Dr. Imogene Burnhen spoke with and examined patient.   She has a 4-6 weeks follow up appointment with Dr. Imogene Burnhen on 11/16/15. Dr. Imogene Burnhen advised her to notify us if her right hand steal symptoms worsen.    Thank you for allowing us to participate in this patient's care.  Brittiney Dicostanzo, Carma LairSUZANNE L, RN, MSN, FNP-C [facility] [place] Office: [phone]  10/05/2015, 10:44  AM  Clinic MD: Imogene Burnhen

## 2015-11-12 ENCOUNTER — Encounter: Payer: Self-pay | Admitting: Vascular Surgery

## 2015-11-15 NOTE — Progress Notes (Deleted)
    Postoperative Access Visit   History of Present Illness  Valerie Perez is a 55 y.o. year old female who presents for postoperative follow-up for: R BC AVF (Date: 10/03/15).  The patient's wounds are *** healed.  The patient notes *** steal symptoms.  The patient is *** able to complete their activities of daily living.  The patient's current symptoms are: ***.  For VQI Use Only  PRE-ADM LIVING: {VQI Pre-admission Living:20973}  AMB STATUS: {VQI Ambulatory Status:20974}  Physical Examination There were no vitals filed for this visit.  RUE: Incision is *** healed, skin feels ***, hand grip is ***/5, sensation in digits is *** intact, ***palpable thrill, bruit can *** be auscultated   Medical Decision Making  Valerie Perez is a 55 y.o. year old female who presents s/p R BC AVF   The patient's access is *** ready for use.  The patient's tunneled dialysis catheter can be removed after two successful cannulations and completed dialysis treatments.  Thank you for allowing us to participate in this patient's care.  Leonides SakeBrian Chen, MD, FACS [facility] [place] Office: [phone] Pager: [phone]

## 2015-11-16 ENCOUNTER — Encounter: Payer: Medicare Other | Admitting: Vascular Surgery

## 2015-11-26 NOTE — Progress Notes (Signed)
    Postoperative Access Visit   History of Present Illness  Valerie Perez is a 55 y.o. year old female who presents for postoperative follow-up for: R BC AVF (Date: 10/03/15).  The patient's wounds are healed.  The patient notes no steal symptoms.  The patient is able to complete their activities of daily living.  The patient's current symptoms are: none.  For VQI Use Only  PRE-ADM LIVING: Home  AMB STATUS: Ambulatory  Physical Examination Vitals:   11/30/15 1329  BP: (!) 199/88  Pulse: 72  Resp: 20  Temp: 98.6 F (37 C)    RUE: Incision is healed, skin feels warm, hand grip is 5/5, sensation in digits is intact, palpable thrill, bruit can be auscultated, on Sonosite: fistula >10 mm throughout much of the fistula, depth > 1 cm throughout much of the fistula with segments 6-8 mm deep  Medical Decision Making  Valerie CunasKatherine C Honaker is a 55 y.o. year old female who presents s/p R BC AVF.   The patient's access is ready for use.  The patient's tunneled dialysis catheter can be removed after two successful cannulations and completed dialysis treatments.  In the event, the fistula cannot be cannulated easily.  I have already discussed with the patient the possible need for superficialization given the depth of the fistula.  Thank you for allowing us to participate in this patient's care.  Leonides SakeBrian Chen, MD, FACS [facility] [place] Office: [phone] Pager: [phone]

## 2015-11-27 ENCOUNTER — Encounter: Payer: Self-pay | Admitting: Vascular Surgery

## 2015-11-30 ENCOUNTER — Ambulatory Visit (INDEPENDENT_AMBULATORY_CARE_PROVIDER_SITE_OTHER): Payer: Self-pay | Admitting: Vascular Surgery

## 2015-11-30 ENCOUNTER — Encounter: Payer: Self-pay | Admitting: Vascular Surgery

## 2015-11-30 VITALS — BP 199/88 | HR 72 | Temp 98.6°F | Resp 20 | Ht 65.0 in | Wt 227.0 lb

## 2015-11-30 DIAGNOSIS — Z992 Dependence on renal dialysis: Secondary | ICD-10-CM

## 2015-11-30 DIAGNOSIS — N186 End stage renal disease: Secondary | ICD-10-CM

## 2015-12-19 ENCOUNTER — Ambulatory Visit: Payer: Commercial Managed Care - PPO | Admitting: Vascular Surgery

## 2015-12-26 ENCOUNTER — Encounter: Payer: Self-pay | Admitting: Vascular Surgery

## 2015-12-28 ENCOUNTER — Encounter: Payer: Self-pay | Admitting: Vascular Surgery

## 2015-12-28 ENCOUNTER — Ambulatory Visit (INDEPENDENT_AMBULATORY_CARE_PROVIDER_SITE_OTHER): Payer: Commercial Managed Care - PPO | Admitting: Vascular Surgery

## 2015-12-28 VITALS — BP 108/57 | HR 53 | Temp 97.2°F | Resp 16 | Ht 65.0 in | Wt 218.0 lb

## 2015-12-28 DIAGNOSIS — N186 End stage renal disease: Secondary | ICD-10-CM

## 2015-12-28 DIAGNOSIS — Z992 Dependence on renal dialysis: Secondary | ICD-10-CM

## 2015-12-28 NOTE — Progress Notes (Signed)
Patient ID: Valerie Perez, female   DOB: 1960-09-22, 55 y.o.   MRN: [id number]  Reason for Consult: Re-evaluation (difficult to stick)   Referred by No ref. provider found  Subjective:     HPI:  Valerie Perez is a 55 y.o. female status post right brachiocephalic AV fistula placement in August of this year. She has subsequently had an infection in her abdominal mesh requiring excision and VAC placement. She now presents with inability to cannulate her fistula on dialysis. This was described to her the time of follow-up that she does have a deep fistula given the size of her upper arm and she may require superficialization in the future. She does have complaints of cold hand that is intermittent. Currently dialyzing via right IJ tunneled dialysis catheter.  Past Medical History:  Diagnosis Date  . Anemia   . Arthritis   . Depression   . Diabetes mellitus without complication (HCC)   . Fatty liver   . Heart murmur    just recently told she had a heart murmur, but had never heard this before  . High cholesterol   . Hypertension   . Hypothyroid   . Neuropathy (HCC)   . Pneumonia   . PONV (postoperative nausea and vomiting)   . Renal disorder    dialysis on Tuesday/Thursday/Saturday  . Sleep apnea    uses cpap   Family History  Problem Relation Age of Onset  . Colon cancer Mother   . Thyroid cancer Father    Past Surgical History:  Procedure Laterality Date  . ABDOMINAL HYSTERECTOMY    . AV FISTULA PLACEMENT Right 10/03/2015   Procedure: CREATION-RIGHT BRACHIOCEPHALIC ARTERIOVENOUS (AV) FISTULA;  Surgeon: Fransisco HertzBrian L Chen, MD;  [facility];  Service: Vascular;  Laterality: Right;  . CHOLECYSTECTOMY  2000  . COLONOSCOPY    . KIDNEY TRANSPLANT  2000    Short Social History:  Social History  Substance Use Topics  . Smoking status: Never Smoker  . Smokeless tobacco: Never Used  . Alcohol use No    Allergies  Allergen Reactions  . Tape Other (See Comments)   Tears skin  . 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    Current Outpatient Prescriptions  Medication Sig Dispense Refill  . acetaminophen (TYLENOL) 500 MG tablet Take 500 mg by mouth every 6 (six) hours as needed for moderate pain.    Marland Kitchen. allopurinol (ZYLOPRIM) 300 MG tablet Take 300 mg by mouth daily.    Marland Kitchen. amLODipine (NORVASC) 5 MG tablet Take 10 mg by mouth daily.    Marland Kitchen. b complex vitamins tablet Take 1 tablet by mouth daily.    Marland Kitchen. buPROPion (WELLBUTRIN XL) 150 MG 24 hr tablet Take 150 mg by mouth at bedtime.    . DULoxetine (CYMBALTA) 60 MG capsule Take 60 mg by mouth at bedtime.    . fluticasone (FLONASE) 50 MCG/ACT nasal spray Place 1 spray into both nostrils every morning.    . folic acid-vitamin b complex-vitamin c-selenium-zinc (DIALYVITE) 3 MG TABS tablet Take 1 tablet by mouth daily.    Marland Kitchen. gabapentin (NEURONTIN) 100 MG capsule Take 300 mg by mouth at bedtime.    . insulin glargine (LANTUS) 100 UNIT/ML injection Inject 45 Units into the skin 2 (two) times daily.    . insulin lispro (HUMALOG) 100 UNIT/ML injection Inject 12 Units into the skin 3 (three) times daily before meals. Sliding scale: 12 units with each meal. Add 1 unit  every 25 point BG > 150. Ex: BG 175, add 1 unit = 13 unit; BG 200, add 2 unit = 14 unit.    Marland Kitchen. labetalol (NORMODYNE) 200 MG tablet Take 100 mg by mouth 2 (two) times daily.    Marland Kitchen. levothyroxine (SYNTHROID, LEVOTHROID) 75 MCG tablet Take 1 tablet (75 mcg total) by mouth daily before breakfast. 30 tablet 0  . loratadine (CLARITIN) 10 MG tablet Take 10 mg by mouth every morning.    . montelukast (SINGULAIR) 10 MG tablet Take 10 mg by mouth at bedtime.    . polyethylene glycol (MIRALAX / GLYCOLAX) packet Take 17 g by mouth daily.    . pravastatin (PRAVACHOL) 40 MG tablet Take 40 mg by mouth at bedtime.    . predniSONE (DELTASONE) 5 MG tablet Take 5 mg by mouth daily with breakfast.    . SENSIPAR 30 MG tablet Take 1 tablet by mouth daily.      . sevelamer carbonate (RENVELA) 800 MG tablet Take 1,600 mg by mouth 3 (three) times daily with meals.      No current facility-administered medications for this visit.     Review of Systems  Constitutional:  Constitutional negative. Respiratory: Respiratory negative.  Cardiovascular: Cardiovascular negative.  GI: Positive for abdominal pain.  Musculoskeletal: Musculoskeletal negative.  Neurological: Neurological negative. Hematologic: Hematologic/lymphatic negative.  Psychiatric: Psychiatric negative.        Objective:  Objective   Vitals:   12/28/15 1353  BP: (!) 108/57  Pulse: (!) 53  Resp: 16  Temp: 97.2 F (36.2 C)  SpO2: 99%  Weight: 218 lb (98.9 kg)  Height: 5\' 5"  (1.651 m)   Body mass index is 36.28 kg/m.  Physical Exam  Constitutional: She appears well-developed.  Eyes: EOM are normal.  Cardiovascular: Normal rate.   Pulmonary/Chest: Effort normal.  Abdominal:  Wound vac in place  Musculoskeletal: She exhibits no edema.  Right upper extremity avf with palpable thrill that is deep  Neurological: She is alert.  Skin: Skin is warm and dry.  Psychiatric: She has a normal mood and affect. Her behavior is normal. Judgment and thought content normal.         Assessment/Plan:    55 year old white female history of right arm brachiocephalic AV fistula placement. She has difficulty cannulating on dialysis given the depth. This was discussed with her at her first follow-up with Dr. Imogene Burnhen. At this time she will need superficialization of her AV fistula. I discussed this with Dr. Imogene Burnhen in office and we will schedule her for him in the coming weeks.    Maeola HarmanBrandon Christopher Laurier Jasperson MD [facility]

## 2016-01-01 ENCOUNTER — Other Ambulatory Visit: Payer: Self-pay

## 2016-01-01 ENCOUNTER — Encounter: Payer: Self-pay | Admitting: Nephrology

## 2016-01-14 HISTORY — PX: DEBRIDEMENT OF ABDOMINAL WALL ABSCESS: SHX6396

## 2016-01-29 ENCOUNTER — Encounter (HOSPITAL_COMMUNITY): Payer: Self-pay | Admitting: *Deleted

## 2016-01-29 NOTE — Anesthesia Preprocedure Evaluation (Addendum)
Anesthesia Evaluation  Patient identified by MRN, date of birth, ID band Patient awake    Reviewed: Allergy & Precautions, NPO status , Patient's Chart, lab work & pertinent test results  Airway Mallampati: III  TM Distance: >3 FB Neck ROM: Full    Dental  (+) Dental Advisory Given   Pulmonary sleep apnea ,    breath sounds clear to auscultation       Cardiovascular hypertension, Pt. on medications and Pt. on home beta blockers  Rhythm:Regular Rate:Normal  04/2015 Left ventricle: The cavity size was normal. Wall thickness was   increased in a pattern of moderate LVH. Systolic function was   normal. The estimated ejection fraction was in the range of 55%   to 60%. - Aortic valve: There was trivial regurgitation. - Mitral valve: There was mild regurgitation   Neuro/Psych negative neurological ROS  negative psych ROS   GI/Hepatic negative GI ROS, Neg liver ROS,   Endo/Other  diabetes, Type 2, Insulin DependentHypothyroidism   Renal/GU DialysisRenal disease  negative genitourinary   Musculoskeletal negative musculoskeletal ROS (+)   Abdominal   Peds negative pediatric ROS (+)  Hematology  (+) anemia ,   Anesthesia Other Findings   Reproductive/Obstetrics negative OB ROS                            Lab Results  Component Value Date   WBC 4.9 04/30/2015   HGB 9.2 (L) 10/03/2015   HCT 27.0 (L) 10/03/2015   MCV 94.3 04/30/2015   PLT 325 04/30/2015   Lab Results  Component Value Date   CREATININE 6.01 (H) 04/30/2015   BUN 36 (H) 04/30/2015   NA 140 10/03/2015   K 5.1 10/03/2015   CL 103 04/30/2015   CO2 26 04/30/2015   No results found for: INR, PROTIME  Anesthesia Physical  Anesthesia Plan  ASA: III  Anesthesia Plan: General   Post-op Pain Management:    Induction: Intravenous  Airway Management Planned: LMA  Additional Equipment:   Intra-op Plan:   Post-operative  Plan: Extubation in OR  Informed Consent: I have reviewed the patients History and Physical, chart, labs and discussed the procedure including the risks, benefits and alternatives for the proposed anesthesia with the patient or authorized representative who has indicated his/her understanding and acceptance.   Dental advisory given  Plan Discussed with:   Anesthesia Plan Comments:        Anesthesia Quick Evaluation

## 2016-01-30 ENCOUNTER — Telehealth: Payer: Self-pay | Admitting: Vascular Surgery

## 2016-01-30 ENCOUNTER — Ambulatory Visit (HOSPITAL_COMMUNITY): Payer: Commercial Managed Care - PPO | Admitting: Anesthesiology

## 2016-01-30 ENCOUNTER — Encounter (HOSPITAL_COMMUNITY)
Admission: RE | Disposition: A | Discharge status: Home / Self Care | Payer: Self-pay | Source: Ambulatory Visit | Attending: Vascular Surgery

## 2016-01-30 ENCOUNTER — Ambulatory Visit (HOSPITAL_COMMUNITY)
Admission: RE | Admit: 2016-01-30 | Discharge: 2016-01-30 | Disposition: A | Discharge status: Home / Self Care | Payer: Commercial Managed Care - PPO | Source: Ambulatory Visit | Attending: Vascular Surgery | Admitting: Vascular Surgery

## 2016-01-30 ENCOUNTER — Encounter (HOSPITAL_COMMUNITY): Payer: Self-pay | Admitting: *Deleted

## 2016-01-30 DIAGNOSIS — E1140 Type 2 diabetes mellitus with diabetic neuropathy, unspecified: Secondary | ICD-10-CM | POA: Diagnosis not present

## 2016-01-30 DIAGNOSIS — M1990 Unspecified osteoarthritis, unspecified site: Secondary | ICD-10-CM | POA: Insufficient documentation

## 2016-01-30 DIAGNOSIS — I120 Hypertensive chronic kidney disease with stage 5 chronic kidney disease or end stage renal disease: Secondary | ICD-10-CM | POA: Diagnosis present

## 2016-01-30 DIAGNOSIS — E1122 Type 2 diabetes mellitus with diabetic chronic kidney disease: Secondary | ICD-10-CM | POA: Insufficient documentation

## 2016-01-30 DIAGNOSIS — T82898A Other specified complication of vascular prosthetic devices, implants and grafts, initial encounter: Secondary | ICD-10-CM | POA: Diagnosis not present

## 2016-01-30 DIAGNOSIS — N186 End stage renal disease: Secondary | ICD-10-CM | POA: Insufficient documentation

## 2016-01-30 DIAGNOSIS — Z992 Dependence on renal dialysis: Secondary | ICD-10-CM | POA: Insufficient documentation

## 2016-01-30 DIAGNOSIS — Z794 Long term (current) use of insulin: Secondary | ICD-10-CM | POA: Insufficient documentation

## 2016-01-30 DIAGNOSIS — N185 Chronic kidney disease, stage 5: Secondary | ICD-10-CM | POA: Diagnosis not present

## 2016-01-30 DIAGNOSIS — E039 Hypothyroidism, unspecified: Secondary | ICD-10-CM | POA: Diagnosis not present

## 2016-01-30 DIAGNOSIS — Z940 Kidney transplant status: Secondary | ICD-10-CM | POA: Diagnosis not present

## 2016-01-30 DIAGNOSIS — G4730 Sleep apnea, unspecified: Secondary | ICD-10-CM | POA: Diagnosis not present

## 2016-01-30 DIAGNOSIS — E7800 Pure hypercholesterolemia, unspecified: Secondary | ICD-10-CM | POA: Diagnosis not present

## 2016-01-30 HISTORY — DX: Dyspnea, unspecified: R06.00

## 2016-01-30 HISTORY — PX: FISTULA SUPERFICIALIZATION: SHX6341

## 2016-01-30 LAB — POCT I-STAT 4, (NA,K, GLUC, HGB,HCT)
Glucose, Bld: 81 mg/dL (ref 65–99)
HCT: 24 % — ABNORMAL LOW (ref 36.0–46.0)
HEMOGLOBIN: 8.2 g/dL — AB (ref 12.0–15.0)
Potassium: 4 mmol/L (ref 3.5–5.1)
Sodium: 140 mmol/L (ref 135–145)

## 2016-01-30 LAB — GLUCOSE, CAPILLARY
GLUCOSE-CAPILLARY: 93 mg/dL (ref 65–99)
GLUCOSE-CAPILLARY: 75 mg/dL (ref 65–99)

## 2016-01-30 SURGERY — FISTULA SUPERFICIALIZATION
Anesthesia: General | Site: Arm Upper | Laterality: Right

## 2016-01-30 MED ORDER — ONDANSETRON HCL 4 MG/2ML IJ SOLN
INTRAMUSCULAR | Status: DC | PRN
  Administered 2016-01-30: 4 mg via INTRAVENOUS

## 2016-01-30 MED ORDER — 0.9 % SODIUM CHLORIDE (POUR BTL) OPTIME
TOPICAL | Status: DC | PRN
  Administered 2016-01-30: 1000 mL

## 2016-01-30 MED ORDER — EPHEDRINE 5 MG/ML INJ
INTRAVENOUS | Status: AC
  Filled 2016-01-30: qty 10

## 2016-01-30 MED ORDER — PROPOFOL 10 MG/ML IV BOLUS
INTRAVENOUS | Status: AC
  Filled 2016-01-30: qty 40

## 2016-01-30 MED ORDER — PHENYLEPHRINE HCL 10 MG/ML IJ SOLN
INTRAVENOUS | Status: DC | PRN
  Administered 2016-01-30: 35 ug/min via INTRAVENOUS

## 2016-01-30 MED ORDER — LIDOCAINE HCL (PF) 1 % IJ SOLN
INTRAMUSCULAR | Status: DC | PRN
  Administered 2016-01-30: 30 mL

## 2016-01-30 MED ORDER — FENTANYL CITRATE (PF) 100 MCG/2ML IJ SOLN
INTRAMUSCULAR | Status: DC | PRN
  Administered 2016-01-30: 50 ug via INTRAVENOUS

## 2016-01-30 MED ORDER — CEFUROXIME SODIUM 1.5 G IJ SOLR
1.5000 g | INTRAMUSCULAR | Status: AC
  Administered 2016-01-30: 1.5 g via INTRAVENOUS
  Filled 2016-01-30: qty 1.5

## 2016-01-30 MED ORDER — MIDAZOLAM HCL 5 MG/5ML IJ SOLN
INTRAMUSCULAR | Status: DC | PRN
  Administered 2016-01-30 (×2): 1 mg via INTRAVENOUS

## 2016-01-30 MED ORDER — MIDAZOLAM HCL 2 MG/2ML IJ SOLN
INTRAMUSCULAR | Status: AC
  Filled 2016-01-30: qty 2

## 2016-01-30 MED ORDER — EPHEDRINE SULFATE 50 MG/ML IJ SOLN
INTRAMUSCULAR | Status: DC | PRN
  Administered 2016-01-30 (×5): 10 mg via INTRAVENOUS

## 2016-01-30 MED ORDER — GLYCOPYRROLATE 0.2 MG/ML IV SOSY
PREFILLED_SYRINGE | INTRAVENOUS | Status: AC
  Filled 2016-01-30: qty 3

## 2016-01-30 MED ORDER — PHENYLEPHRINE HCL 10 MG/ML IJ SOLN
INTRAMUSCULAR | Status: AC
  Filled 2016-01-30: qty 1

## 2016-01-30 MED ORDER — SODIUM CHLORIDE 0.9 % IV SOLN
INTRAVENOUS | Status: DC | PRN
  Administered 2016-01-30: 09:00:00

## 2016-01-30 MED ORDER — LIDOCAINE HCL (CARDIAC) 20 MG/ML IV SOLN
INTRAVENOUS | Status: DC | PRN
  Administered 2016-01-30: 60 mg via INTRAVENOUS

## 2016-01-30 MED ORDER — HEMOSTATIC AGENTS (NO CHARGE) OPTIME
TOPICAL | Status: DC | PRN
  Administered 2016-01-30: 2 via TOPICAL

## 2016-01-30 MED ORDER — LIDOCAINE HCL (PF) 1 % IJ SOLN
INTRAMUSCULAR | Status: AC
  Filled 2016-01-30: qty 30

## 2016-01-30 MED ORDER — PHENYLEPHRINE 40 MCG/ML (10ML) SYRINGE FOR IV PUSH (FOR BLOOD PRESSURE SUPPORT)
PREFILLED_SYRINGE | INTRAVENOUS | Status: AC
  Filled 2016-01-30: qty 20

## 2016-01-30 MED ORDER — SODIUM CHLORIDE 0.9 % IV SOLN
INTRAVENOUS | Status: DC
  Administered 2016-01-30 (×2): via INTRAVENOUS

## 2016-01-30 MED ORDER — PROPOFOL 10 MG/ML IV BOLUS
INTRAVENOUS | Status: DC | PRN
  Administered 2016-01-30: 40 mg via INTRAVENOUS
  Administered 2016-01-30: 140 mg via INTRAVENOUS

## 2016-01-30 MED ORDER — OXYCODONE-ACETAMINOPHEN 5-325 MG PO TABS: 1.0000 | ORAL_TABLET | Freq: Four times a day (QID) | ORAL | 0 refills | Status: DC | PRN

## 2016-01-30 MED ORDER — ARTIFICIAL TEARS OP OINT
TOPICAL_OINTMENT | OPHTHALMIC | Status: AC
  Filled 2016-01-30: qty 3.5

## 2016-01-30 MED ORDER — FENTANYL CITRATE (PF) 100 MCG/2ML IJ SOLN
INTRAMUSCULAR | Status: AC
  Filled 2016-01-30: qty 2

## 2016-01-30 MED ORDER — GLYCOPYRROLATE 0.2 MG/ML IJ SOLN
INTRAMUSCULAR | Status: DC | PRN
  Administered 2016-01-30: 0.4 mg via INTRAVENOUS

## 2016-01-30 MED ORDER — HYDROMORPHONE HCL 1 MG/ML IJ SOLN: 0.2500 mg | INTRAMUSCULAR | Status: DC | PRN

## 2016-01-30 MED ORDER — PHENYLEPHRINE HCL 10 MG/ML IJ SOLN
INTRAMUSCULAR | Status: DC | PRN
  Administered 2016-01-30 (×7): 80 ug via INTRAVENOUS

## 2016-01-30 MED ORDER — CHLORHEXIDINE GLUCONATE CLOTH 2 % EX PADS: 6.0000 | MEDICATED_PAD | Freq: Once | CUTANEOUS | Status: DC

## 2016-01-30 SURGICAL SUPPLY — 37 items
AGENT HMST SPONGE THK3/8 (HEMOSTASIS) ×2
ARMBAND PINK RESTRICT EXTREMIT (MISCELLANEOUS) ×3 IMPLANT
CANISTER SUCTION 2500CC (MISCELLANEOUS) ×3 IMPLANT
CLIP TI MEDIUM 6 (CLIP) ×3 IMPLANT
CLIP TI WIDE RED SMALL 6 (CLIP) ×3 IMPLANT
COVER PROBE W GEL 5X96 (DRAPES) IMPLANT
DECANTER SPIKE VIAL GLASS SM (MISCELLANEOUS) ×3 IMPLANT
DERMABOND ADVANCED (GAUZE/BANDAGES/DRESSINGS)
DERMABOND ADVANCED .7 DNX12 (GAUZE/BANDAGES/DRESSINGS) IMPLANT
DRAIN PENROSE 1/2X12 LTX STRL (WOUND CARE) IMPLANT
DRSG COVADERM 4X10 (GAUZE/BANDAGES/DRESSINGS) ×3 IMPLANT
ELECT REM PT RETURN 9FT ADLT (ELECTROSURGICAL) ×3
ELECTRODE REM PT RTRN 9FT ADLT (ELECTROSURGICAL) ×1 IMPLANT
GAUZE SPONGE 4X4 12PLY STRL (GAUZE/BANDAGES/DRESSINGS) ×3 IMPLANT
GLOVE BIO SURGEON STRL SZ7 (GLOVE) ×3 IMPLANT
GLOVE BIOGEL PI IND STRL 7.0 (GLOVE) ×2 IMPLANT
GLOVE BIOGEL PI IND STRL 7.5 (GLOVE) ×1 IMPLANT
GLOVE BIOGEL PI INDICATOR 7.0 (GLOVE) ×4
GLOVE BIOGEL PI INDICATOR 7.5 (GLOVE) ×2
GOWN STRL REUS W/ TWL LRG LVL3 (GOWN DISPOSABLE) ×2 IMPLANT
GOWN STRL REUS W/ TWL XL LVL3 (GOWN DISPOSABLE) ×2 IMPLANT
GOWN STRL REUS W/TWL LRG LVL3 (GOWN DISPOSABLE) ×4
GOWN STRL REUS W/TWL XL LVL3 (GOWN DISPOSABLE) ×4
HEMOSTAT SPONGE AVITENE ULTRA (HEMOSTASIS) ×6 IMPLANT
KIT BASIN OR (CUSTOM PROCEDURE TRAY) ×3 IMPLANT
KIT ROOM TURNOVER OR (KITS) ×3 IMPLANT
NS IRRIG 1000ML POUR BTL (IV SOLUTION) ×3 IMPLANT
PACK CV ACCESS (CUSTOM PROCEDURE TRAY) ×3 IMPLANT
PAD ARMBOARD 7.5X6 YLW CONV (MISCELLANEOUS) ×6 IMPLANT
STAPLER VISISTAT 35W (STAPLE) ×3 IMPLANT
SUT MNCRL AB 4-0 PS2 18 (SUTURE) ×3 IMPLANT
SUT PROLENE 6 0 BV (SUTURE) IMPLANT
SUT PROLENE 7 0 BV 1 (SUTURE) ×6 IMPLANT
SUT VIC AB 3-0 SH 27 (SUTURE) ×8
SUT VIC AB 3-0 SH 27X BRD (SUTURE) ×4 IMPLANT
UNDERPAD 30X30 (UNDERPADS AND DIAPERS) ×3 IMPLANT
WATER STERILE IRR 1000ML POUR (IV SOLUTION) ×3 IMPLANT

## 2016-01-30 NOTE — H&P (Signed)
Brief History and Physical  History of Present Illness  Valerie Perez is a 55 y.o. female who presents with chief complaint: difficulty cannulating R BC AVF.  The patient presents today for superficialization of R BC AVF.    Past Medical History:  Diagnosis Date  . Anemia   . Arthritis    osteoarthritis  . Depression   . Diabetes mellitus without complication (HCC)    Type II  . Dyspnea    with exertion  . Fatty liver   . Heart murmur    just recently told she had a heart murmur, but had never heard this before  . High cholesterol   . Hypertension   . Hypothyroid   . Neuropathy (HCC)   . Pneumonia   . PONV (postoperative nausea and vomiting)   . Renal disorder    dialysis on Tuesday/Thursday/Saturday  . Sleep apnea    uses cpap    Past Surgical History:  Procedure Laterality Date  . ABDOMINAL HYSTERECTOMY    . AV FISTULA PLACEMENT Right 10/03/2015   Procedure: CREATION-RIGHT BRACHIOCEPHALIC ARTERIOVENOUS (AV) FISTULA;  Surgeon: Fransisco HertzBrian L Donnelle Olmeda, MD;  [facility];  Service: Vascular;  Laterality: Right;  . CHOLECYSTECTOMY  2000  . COLONOSCOPY    . DEBRIDEMENT OF ABDOMINAL WALL ABSCESS  01/14/2016  . KIDNEY TRANSPLANT  2000    Social History   Social History  . Marital status: Married    Spouse name: N/A  . Number of children: N/A  . Years of education: N/A   Occupational History  . Not on file.   Social History Main Topics  . Smoking status: Never Smoker  . Smokeless tobacco: Never Used  . Alcohol use No  . Drug use: No  . Sexual activity: Not on file   Other Topics Concern  . Not on file   Social History Narrative  . No narrative on file    Family History  Problem Relation Age of Onset  . Colon cancer Mother   . Thyroid cancer Father     No current facility-administered medications on file prior to encounter.    Current Outpatient Prescriptions on File Prior to Encounter  Medication Sig Dispense Refill  . allopurinol (ZYLOPRIM) 300  MG tablet Take 300 mg by mouth daily.    Marland Kitchen. amLODipine (NORVASC) 5 MG tablet Take 10 mg by mouth daily.    Marland Kitchen. buPROPion (WELLBUTRIN XL) 150 MG 24 hr tablet Take 150 mg by mouth at bedtime.    . DULoxetine (CYMBALTA) 60 MG capsule Take 60 mg by mouth at bedtime.    . gabapentin (NEURONTIN) 100 MG capsule Take 300 mg by mouth at bedtime.    . insulin glargine (LANTUS) 100 UNIT/ML injection Inject 15 Units into the skin 2 (two) times daily.     Marland Kitchen. labetalol (NORMODYNE) 200 MG tablet Take 200 mg by mouth 3 (three) times daily.     Marland Kitchen. levothyroxine (SYNTHROID, LEVOTHROID) 75 MCG tablet Take 1 tablet (75 mcg total) by mouth daily before breakfast. 30 tablet 0  . loratadine (CLARITIN) 10 MG tablet Take 10 mg by mouth every morning.    . montelukast (SINGULAIR) 10 MG tablet Take 10 mg by mouth at bedtime.    . polyethylene glycol (MIRALAX / GLYCOLAX) packet Take 17 g by mouth daily.    . pravastatin (PRAVACHOL) 40 MG tablet Take 40 mg by mouth at bedtime.    . predniSONE (DELTASONE) 5 MG tablet Take 5 mg by mouth  daily with breakfast.    . SENSIPAR 30 MG tablet Take 1 tablet by mouth daily.    . sevelamer carbonate (RENVELA) 800 MG tablet Take 1,600 mg by mouth 3 (three) times daily with meals.     Marland Kitchen. acetaminophen (TYLENOL) 500 MG tablet Take 500 mg by mouth every 6 (six) hours as needed for moderate pain.    . fluticasone (FLONASE) 50 MCG/ACT nasal spray Place 1 spray into both nostrils every morning.    . insulin lispro (HUMALOG) 100 UNIT/ML injection Inject 12 Units into the skin 3 (three) times daily before meals. Sliding scale: 12 units with each meal. Add 1 unit every 25 point BG > 150. Ex: BG 175, add 1 unit = 13 unit; BG 200, add 2 unit = 14 unit.      Allergies  Allergen Reactions  . Tape Other (See Comments)    Tears skin  . 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    Review of Systems: As listed above, otherwise negative.  Physical Examination  Vitals:    01/30/16 0651  BP: (!) 151/59  Pulse: (!) 58  Resp: 18  Temp: 98.4 F (36.9 C)  TempSrc: Oral  SpO2: 95%    General: A&O x 3, WDWN  Pulmonary: Sym exp, good air movt, CTAB, no rales, rhonchi, & wheezing  Cardiac: RRR, Nl S1, S2, no Murmurs, rubs or gallops  Gastrointestinal: soft, NTND, -G/R, - HSM, - masses, - CVAT B  Musculoskeletal: M/S 5/5 throughout , Extremities without ischemic changes, palpable thrill in distal fistula,+bruit  Laboratory See iStat  Medical Decision Making  Valerie Perez is a 55 y.o. female who presents with: deep R BC AVF.   The patient is scheduled for: R BC AVF superficialization  Risk, benefits, and alternatives to access surgery were discussed.  The patient is aware the risks include but are not limited to: bleeding, infection, thrombosis, steal syndrome, nerve damage, ischemic monomelic neuropathy, failure to mature, and need for additional procedures.  The patient is aware of the risks and agrees to proceed.  Leonides SakeBrian Melany Wiesman, MD [facility] [place] Office: [phone] Pager: [phone]  01/30/2016, 8:19 AM

## 2016-01-30 NOTE — Op Note (Signed)
    OPERATIVE NOTE   PROCEDURE: 1. Superficialization of right brachiocephalic arteriovenous fistula  2. Side branch ligation x 3  PRE-OPERATIVE DIAGNOSIS: end stage renal disease, deep right brachiocephalic arteriovenous fistula    POST-OPERATIVE DIAGNOSIS: same as above   SURGEON: Leonides SakeBrian Koal Eslinger, MD  ASSISTANT(S): Lianne CureMaureen Collins, PAC   ANESTHESIA: general  ESTIMATED BLOOD LOSS: 50 cc  FINDING(S): 1. Fistula >2-4 cm deep  2. Easily palpable thrill in transposed fistula 3. Fistula 6-8 mm throughout  SPECIMEN(S):  none  INDICATIONS:   Valerie Perez is a [age] y.o. female who  presents with deep right brachiocephalic arteriovenous fistula.  We discussed that superficialization of the fistula was necessary to facilitate future cannulation of the fistula.  In the process, ligation of side branches would be completed.  Risk, benefits, and alternatives to access surgery were discussed.  The patient is aware the risks include but are not limited to: bleeding, infection, steal syndrome, nerve damage, ischemic monomelic neuropathy, failure to mature, need for additional procedures, death and stroke.  The patient agrees to proceed forward with the procedure.  DESCRIPTION: After obtain full informed written consent, the patient was brought back to the operating room and placed supine upon the operating table.  The patient received IV antibiotics prior to induction.  After obtaining adequate anesthesia, the patient was prepped and draped in the standard fashion for: right access procedure.  The brachiocephalic arteriovenous fistula was identified with the Sonosite and marked on the arm.  I made a longitudinal incision over the fistula and dissected down to the fistula.  I mobilized the fistula from the anastomosis to the axillary extent of the fistula, in the process ligating side branches with silk ties x 3.  The fistula appeared: 6-8 mm throughout.   I sharply lysed connective tissue surrounding  the fistula.  I made a shelf in the adjacent subcutaneous tissue that was ~6 mm deep from the skin.  I transposed the fistula onto this shelf and secured the fistula in placed with interrupted 3-0 Vicryl stitches tied to the side branches to secure the fistula in place.  The deep subcutaneous tissue was reapproximated with interrupted 3-0 Vicryl to abolish some of the dead space.  A running subcutaneous stitch of 3-0 Vicryl was used to reapproximated the superficial subcutaneous tissue in two layers.  The skin was then reapproximated with a staples.  The skin was cleaned, dried, and dressed with a Coverderm.  There was an easily palpable thrill at the end of this case.  COMPLICATIONS: none  CONDITION: stable   Leonides SakeBrian Tin Engram, MD [facility] [place] Office: [phone] Pager: [phone]  01/30/2016, 10:38 AM

## 2016-01-30 NOTE — Transfer of Care (Signed)
Immediate Anesthesia Transfer of Care Note  Patient: Valerie Perez  Procedure(s) Performed: Procedure(s): FISTULA SUPERFICIALIZATION RIGHT UPPER ARM (Right)  Patient Location: PACU  Anesthesia Type:General  Level of Consciousness: awake, alert , oriented and sedated  Airway & Oxygen Therapy: Patient Spontanous Breathing and Patient connected to nasal cannula oxygen  Post-op Assessment: Report given to RN, Post -op Vital signs reviewed and stable and Patient moving all extremities  Post vital signs: Reviewed and stable  Last Vitals:  Vitals:   01/30/16 0651 01/30/16 1045  BP: (!) 151/59   Pulse: (!) 58   Resp: 18   Temp: 36.9 C 36.6 C    Last Pain:  Vitals:   01/30/16 0651  TempSrc: Oral         Complications: No apparent anesthesia complications

## 2016-01-30 NOTE — Telephone Encounter (Signed)
2 week f/u with NP for staple removal from (R) BC AVF Superficialization  Received: Today  Message Contents  Valerie Odorarol S Pullins, RN  P [facility]      Previous Messages    ----- Message -----  From: Fransisco HertzBrian L Chen, MD  Sent: 01/30/2016 11:04 AM  To: [facility] [address]Pool   [name]  [phone]  1960/12/09   Needs a 2 weeks nurse visit for [facility]

## 2016-01-30 NOTE — Anesthesia Procedure Notes (Signed)
Procedure Name: LMA Insertion Date/Time: 01/30/2016 8:43 AM Performed by: Fransisca KaufmannMEYER, Gerrard Crystal E Pre-anesthesia Checklist: Patient identified, Emergency Drugs available, Suction available and Patient being monitored Patient Re-evaluated:Patient Re-evaluated prior to inductionOxygen Delivery Method: Circle System Utilized Preoxygenation: Pre-oxygenation with 100% oxygen Intubation Type: IV induction Ventilation: Mask ventilation without difficulty LMA: LMA inserted LMA Size: 3.0 Number of attempts: 1 Placement Confirmation: positive ETCO2 Tube secured with: Tape Dental Injury: Teeth and Oropharynx as per pre-operative assessment

## 2016-01-30 NOTE — Telephone Encounter (Signed)
Sched staple removal 02/13/16 at 1:15. Sched MD appt 02/29/16 at 1:00. Spoke to pt to inform them of appts.

## 2016-01-30 NOTE — Anesthesia Postprocedure Evaluation (Signed)
Anesthesia Post Note  Patient: Valerie Perez  Procedure(s) Performed: Procedure(s) (LRB): FISTULA SUPERFICIALIZATION RIGHT UPPER ARM (Right)  Patient location during evaluation: PACU Anesthesia Type: General Level of consciousness: awake and alert Pain management: pain level controlled Vital Signs Assessment: post-procedure vital signs reviewed and stable Respiratory status: spontaneous breathing, nonlabored ventilation, respiratory function stable and patient connected to nasal cannula oxygen Cardiovascular status: blood pressure returned to baseline and stable Postop Assessment: no signs of nausea or vomiting Anesthetic complications: no    Last Vitals:  Vitals:   01/30/16 1145 01/30/16 1157  BP:  133/71  Pulse:    Resp:  20  Temp: 36.7 C     Last Pain:  Vitals:   01/30/16 0651  TempSrc: Napoleon Formral                 Saranda Legrande E

## 2016-01-30 NOTE — Telephone Encounter (Signed)
-----   Message from Phillips Odorarol S Pullins, RN sent at 01/30/2016 12:52 PM EST ----- Regarding: needs 4 week f/u with Dr. Imogene Burnhen   ----- Message ----- From: Fransisco HertzBrian L Chen, MD Sent: 01/30/2016  10:43 AM To: [facility] [address]Pool  Renella CunasKatherine C Legendre [phone] [birth year]  PROCEDURE: Superficialization of right brachiocephalic arteriovenous fistula  Side branch ligation x 3  Asst: Lianne CureMaureen Collins, [facility]   Follow-up: 4 weeks

## 2016-01-31 ENCOUNTER — Encounter (HOSPITAL_COMMUNITY): Payer: Self-pay | Admitting: Vascular Surgery

## 2016-02-01 ENCOUNTER — Telehealth: Payer: Self-pay

## 2016-02-01 NOTE — Telephone Encounter (Signed)
Pt. called to inquire about incision care of right arm.  Returned call and spoke with pt's husband.  Advised that she can wash incision with soap and water, after 48 hrs from her surgery.  Advised she can either wash at the sink or take a shower, but not to submerge surgical incision in bathtub of water.  Advised that she can reapply a light bandage to continue to keep area protected and clean.  Emphasized to pt's husband that we want her to keep the incision clean and dry.  Advised she may also leave the incision open to air, when resting at home.  Encouraged to call if further questions.  Husband verb. understanding.

## 2016-02-07 ENCOUNTER — Encounter: Payer: Self-pay | Admitting: Family

## 2016-02-13 ENCOUNTER — Ambulatory Visit (INDEPENDENT_AMBULATORY_CARE_PROVIDER_SITE_OTHER): Payer: Self-pay | Admitting: Family

## 2016-02-13 ENCOUNTER — Encounter: Payer: Self-pay | Admitting: Family

## 2016-02-13 VITALS — BP 154/81 | HR 56 | Temp 98.4°F | Resp 20 | Ht 65.0 in | Wt 219.0 lb

## 2016-02-13 DIAGNOSIS — Z992 Dependence on renal dialysis: Secondary | ICD-10-CM

## 2016-02-13 DIAGNOSIS — Z4802 Encounter for removal of sutures: Secondary | ICD-10-CM

## 2016-02-13 DIAGNOSIS — N186 End stage renal disease: Secondary | ICD-10-CM

## 2016-02-13 DIAGNOSIS — I770 Arteriovenous fistula, acquired: Secondary | ICD-10-CM

## 2016-02-13 NOTE — Progress Notes (Signed)
    Postoperative Access Visit   History of Present Illness  Valerie Perez is a 55 y.o. year old female who is s/p Superficialization of right brachiocephalic arteriovenous fistula and Side branch ligation x 3 on 01-30-16 by Dr. Imogene Burnhen for end stage renal disease, deep right brachiocephalic arteriovenous fistula   Surgery FINDING(S): 1. Fistula >2-4 cm deep  2. Easily palpable thrill in transposed fistula 3. Fistula 6-8 mm throughout  She returns today for staples removal.  She dialyzes T-TH-S via right upper chest catheter.   The patient's incisions are healed.  The patient notes minimal steal symptoms.  The patient is able to complete their activities of daily living.  The patient's current symptoms are: none.  For VQI Use Only  PRE-ADM LIVING: Home  AMB STATUS: Ambulatory  Physical Examination Vitals:   02/13/16 0911  BP: (!) 154/81  Pulse: (!) 56  Resp: 20  Temp: 98.4 F (36.9 C)  TempSrc: Oral  SpO2: 95%  Weight: 219 lb (99.3 kg)  Height: 5\' 5"  (1.651 m)   Body mass index is 36.44 kg/m.  Right upper arm incision is healing, incision edges are well proximated, no erythema, no swelling of right arm, skin feels normal and warm, hand grip is 5/5, sensation in digits is intact, no palpable thrill, but bruit can be auscultated. No erythema,   Medical Decision Making  Valerie Perez is a 55 y.o. year old female who presents s/p Superficialization of right brachiocephalic arteriovenous fistula and Side branch ligation x 3 on 01-30-16 by Dr. Catalina Antiguahenfor end stage renal disease, deep right brachiocephalic arteriovenous fistula. All staples removed.  Follow up on 03-01-15 with PA as already scheduled, for 4 weeks follow up.   Thank you for allowing us to participate in this patient's care.  NICKEL, Carma LairSUZANNE L, RN, MSN, FNP-C [facility] [place] Office: [phone]  02/13/2016, 9:27 AM  Clinic MD: Imogene Burnhen

## 2016-02-14 ENCOUNTER — Encounter: Payer: Commercial Managed Care - PPO | Admitting: Family

## 2016-02-15 ENCOUNTER — Encounter: Payer: Self-pay | Admitting: Family

## 2016-02-25 DIAGNOSIS — I82409 Acute embolism and thrombosis of unspecified deep veins of unspecified lower extremity: Secondary | ICD-10-CM

## 2016-02-25 HISTORY — DX: Acute embolism and thrombosis of unspecified deep veins of unspecified lower extremity: I82.409

## 2016-02-29 ENCOUNTER — Ambulatory Visit (INDEPENDENT_AMBULATORY_CARE_PROVIDER_SITE_OTHER): Payer: Self-pay | Admitting: Vascular Surgery

## 2016-02-29 ENCOUNTER — Encounter: Payer: Self-pay | Admitting: Vascular Surgery

## 2016-02-29 VITALS — BP 189/97 | HR 70 | Temp 97.0°F | Resp 20 | Ht 65.0 in | Wt 232.0 lb

## 2016-02-29 DIAGNOSIS — Z992 Dependence on renal dialysis: Secondary | ICD-10-CM

## 2016-02-29 DIAGNOSIS — N186 End stage renal disease: Secondary | ICD-10-CM

## 2016-02-29 NOTE — Progress Notes (Signed)
Patient name: Valerie Perez MRN: [id number] DOB: Mar 03, 1960 Sex: female  REASON FOR VISIT: post-op  HPI: Valerie Perez is a 56 y.o. female who presents for postoperative follow-up status post super fistulization of her right upper arm AV fistula. The patient has some mild intermittent numbness to her right fingers. This is tolerable. She is currently using a right IJ tunneled dialysis catheter for dialysis.   Current Outpatient Prescriptions  Medication Sig Dispense Refill  . acetaminophen (TYLENOL) 500 MG tablet Take 500 mg by mouth every 6 (six) hours as needed for moderate pain.    Marland Kitchen allopurinol (ZYLOPRIM) 300 MG tablet Take 300 mg by mouth daily.    Marland Kitchen amLODipine (NORVASC) 5 MG tablet Take 10 mg by mouth daily.    Marland Kitchen buPROPion (WELLBUTRIN XL) 150 MG 24 hr tablet Take 150 mg by mouth at bedtime.    . carvedilol (COREG) 12.5 MG tablet Take 12.5 mg by mouth 2 (two) times daily with a meal.    . DULoxetine (CYMBALTA) 60 MG capsule Take 60 mg by mouth at bedtime.    . fluticasone (FLONASE) 50 MCG/ACT nasal spray Place 1 spray into both nostrils every morning.    . gabapentin (NEURONTIN) 100 MG capsule Take 300 mg by mouth at bedtime.    . insulin glargine (LANTUS) 100 UNIT/ML injection Inject 15 Units into the skin 2 (two) times daily.     . insulin lispro (HUMALOG) 100 UNIT/ML injection Inject 12 Units into the skin 3 (three) times daily before meals. Sliding scale: 12 units with each meal. Add 1 unit every 25 point BG > 150. Ex: BG 175, add 1 unit = 13 unit; BG 200, add 2 unit = 14 unit.    Marland Kitchen levothyroxine (SYNTHROID, LEVOTHROID) 75 MCG tablet Take 1 tablet (75 mcg total) by mouth daily before breakfast. 30 tablet 0  . loratadine (CLARITIN) 10 MG tablet Take 10 mg by mouth every morning.    . montelukast (SINGULAIR) 10 MG tablet Take 10 mg by mouth at bedtime.    Marland Kitchen oxyCODONE-acetaminophen (PERCOCET/ROXICET) 5-325 MG tablet Take 1 tablet by mouth every 6 (six) hours as needed. 15  tablet 0  . polyethylene glycol (MIRALAX / GLYCOLAX) packet Take 17 g by mouth daily.    . pravastatin (PRAVACHOL) 40 MG tablet Take 40 mg by mouth at bedtime.    . predniSONE (DELTASONE) 5 MG tablet Take 5 mg by mouth daily with breakfast.    . SENSIPAR 30 MG tablet Take 1 tablet by mouth daily.    . sevelamer carbonate (RENVELA) 800 MG tablet Take 1,600 mg by mouth 3 (three) times daily with meals.      No current facility-administered medications for this visit.     REVIEW OF SYSTEMS:  [X]  denotes positive finding, [ ]  denotes negative finding Cardiac  Comments:  Chest pain or chest pressure:    Shortness of breath upon exertion:    Short of breath when lying flat:    Irregular heart rhythm:    Constitutional    Fever or chills:      PHYSICAL EXAM: Vitals:   02/29/16 1538  BP: (!) 189/97  Pulse: 70  Resp: 20  Temp: 97 F (36.1 C)  TempSrc: Oral  SpO2: 93%  Weight: 232 lb (105.2 kg)  Height: 5\' 5"  (1.651 m)    GENERAL: The patient is a well-nourished female, in no acute distress. The vital signs are documented above. VASCULAR: Right upper arm incision well healed.  Easily palpable thrill throughout right upper arm fistula. Right hand is warm with sensation and motor function intact.  MEDICAL ISSUES: Status post superficialization of right upper arm fistula  The patient's incisions are well healed and her fistula is ready for cannulation. Her catheter may be removed after two successful cannulations and completed dialysis treatments. Will send a note to Dr. Hyman HopesWebb. She will return to Dr. Juel BurrowLin for catheter removal.   Maris BergerKimberly Francesca Strome, PA-C [facility]  Clinic MD: Imogene Burnhen

## 2016-03-11 ENCOUNTER — Inpatient Hospital Stay (HOSPITAL_COMMUNITY)
Admission: EM | Admit: 2016-03-11 | Discharge: 2016-03-25 | DRG: 286 | Disposition: A | Discharge status: Home Health | Payer: Commercial Managed Care - PPO | Attending: Internal Medicine | Admitting: Internal Medicine

## 2016-03-11 ENCOUNTER — Inpatient Hospital Stay (HOSPITAL_COMMUNITY): Payer: Commercial Managed Care - PPO

## 2016-03-11 ENCOUNTER — Emergency Department (HOSPITAL_COMMUNITY): Payer: Commercial Managed Care - PPO

## 2016-03-11 ENCOUNTER — Encounter (HOSPITAL_COMMUNITY): Payer: Self-pay | Admitting: *Deleted

## 2016-03-11 DIAGNOSIS — I4901 Ventricular fibrillation: Secondary | ICD-10-CM

## 2016-03-11 DIAGNOSIS — Z794 Long term (current) use of insulin: Secondary | ICD-10-CM | POA: Diagnosis not present

## 2016-03-11 DIAGNOSIS — R570 Cardiogenic shock: Principal | ICD-10-CM | POA: Diagnosis present

## 2016-03-11 DIAGNOSIS — J690 Pneumonitis due to inhalation of food and vomit: Secondary | ICD-10-CM | POA: Diagnosis present

## 2016-03-11 DIAGNOSIS — Z79899 Other long term (current) drug therapy: Secondary | ICD-10-CM

## 2016-03-11 DIAGNOSIS — Z992 Dependence on renal dialysis: Secondary | ICD-10-CM

## 2016-03-11 DIAGNOSIS — E039 Hypothyroidism, unspecified: Secondary | ICD-10-CM | POA: Diagnosis present

## 2016-03-11 DIAGNOSIS — S31109D Unspecified open wound of abdominal wall, unspecified quadrant without penetration into peritoneal cavity, subsequent encounter: Secondary | ICD-10-CM

## 2016-03-11 DIAGNOSIS — W19XXXA Unspecified fall, initial encounter: Secondary | ICD-10-CM

## 2016-03-11 DIAGNOSIS — A419 Sepsis, unspecified organism: Secondary | ICD-10-CM | POA: Diagnosis not present

## 2016-03-11 DIAGNOSIS — I120 Hypertensive chronic kidney disease with stage 5 chronic kidney disease or end stage renal disease: Secondary | ICD-10-CM | POA: Diagnosis present

## 2016-03-11 DIAGNOSIS — I469 Cardiac arrest, cause unspecified: Secondary | ICD-10-CM | POA: Diagnosis not present

## 2016-03-11 DIAGNOSIS — N2581 Secondary hyperparathyroidism of renal origin: Secondary | ICD-10-CM | POA: Diagnosis present

## 2016-03-11 DIAGNOSIS — G4733 Obstructive sleep apnea (adult) (pediatric): Secondary | ICD-10-CM | POA: Diagnosis not present

## 2016-03-11 DIAGNOSIS — J9602 Acute respiratory failure with hypercapnia: Secondary | ICD-10-CM | POA: Diagnosis present

## 2016-03-11 DIAGNOSIS — F329 Major depressive disorder, single episode, unspecified: Secondary | ICD-10-CM | POA: Diagnosis present

## 2016-03-11 DIAGNOSIS — N186 End stage renal disease: Secondary | ICD-10-CM

## 2016-03-11 DIAGNOSIS — Z6837 Body mass index (BMI) 37.0-37.9, adult: Secondary | ICD-10-CM

## 2016-03-11 DIAGNOSIS — Z940 Kidney transplant status: Secondary | ICD-10-CM | POA: Diagnosis not present

## 2016-03-11 DIAGNOSIS — E11649 Type 2 diabetes mellitus with hypoglycemia without coma: Secondary | ICD-10-CM | POA: Diagnosis present

## 2016-03-11 DIAGNOSIS — R0689 Other abnormalities of breathing: Secondary | ICD-10-CM

## 2016-03-11 DIAGNOSIS — I10 Essential (primary) hypertension: Secondary | ICD-10-CM | POA: Diagnosis not present

## 2016-03-11 DIAGNOSIS — Z452 Encounter for adjustment and management of vascular access device: Secondary | ICD-10-CM | POA: Diagnosis not present

## 2016-03-11 DIAGNOSIS — R778 Other specified abnormalities of plasma proteins: Secondary | ICD-10-CM | POA: Diagnosis present

## 2016-03-11 DIAGNOSIS — I160 Hypertensive urgency: Secondary | ICD-10-CM | POA: Diagnosis not present

## 2016-03-11 DIAGNOSIS — X58XXXD Exposure to other specified factors, subsequent encounter: Secondary | ICD-10-CM | POA: Diagnosis present

## 2016-03-11 DIAGNOSIS — G931 Anoxic brain damage, not elsewhere classified: Secondary | ICD-10-CM | POA: Diagnosis present

## 2016-03-11 DIAGNOSIS — G9340 Encephalopathy, unspecified: Secondary | ICD-10-CM | POA: Diagnosis not present

## 2016-03-11 DIAGNOSIS — E1122 Type 2 diabetes mellitus with diabetic chronic kidney disease: Secondary | ICD-10-CM | POA: Diagnosis present

## 2016-03-11 DIAGNOSIS — J9601 Acute respiratory failure with hypoxia: Secondary | ICD-10-CM | POA: Diagnosis present

## 2016-03-11 DIAGNOSIS — Z9289 Personal history of other medical treatment: Secondary | ICD-10-CM

## 2016-03-11 DIAGNOSIS — I468 Cardiac arrest due to other underlying condition: Secondary | ICD-10-CM | POA: Diagnosis present

## 2016-03-11 DIAGNOSIS — J9690 Respiratory failure, unspecified, unspecified whether with hypoxia or hypercapnia: Secondary | ICD-10-CM

## 2016-03-11 DIAGNOSIS — I824Z9 Acute embolism and thrombosis of unspecified deep veins of unspecified distal lower extremity: Secondary | ICD-10-CM | POA: Diagnosis not present

## 2016-03-11 DIAGNOSIS — R092 Respiratory arrest: Secondary | ICD-10-CM

## 2016-03-11 DIAGNOSIS — R7989 Other specified abnormal findings of blood chemistry: Secondary | ICD-10-CM | POA: Diagnosis present

## 2016-03-11 DIAGNOSIS — I25700 Atherosclerosis of coronary artery bypass graft(s), unspecified, with unstable angina pectoris: Secondary | ICD-10-CM | POA: Diagnosis not present

## 2016-03-11 DIAGNOSIS — I214 Non-ST elevation (NSTEMI) myocardial infarction: Secondary | ICD-10-CM | POA: Diagnosis not present

## 2016-03-11 DIAGNOSIS — J189 Pneumonia, unspecified organism: Secondary | ICD-10-CM | POA: Diagnosis not present

## 2016-03-11 DIAGNOSIS — D638 Anemia in other chronic diseases classified elsewhere: Secondary | ICD-10-CM | POA: Diagnosis present

## 2016-03-11 DIAGNOSIS — I82431 Acute embolism and thrombosis of right popliteal vein: Secondary | ICD-10-CM | POA: Diagnosis present

## 2016-03-11 DIAGNOSIS — R4182 Altered mental status, unspecified: Secondary | ICD-10-CM | POA: Diagnosis present

## 2016-03-11 DIAGNOSIS — E7800 Pure hypercholesterolemia, unspecified: Secondary | ICD-10-CM | POA: Diagnosis present

## 2016-03-11 DIAGNOSIS — D899 Disorder involving the immune mechanism, unspecified: Secondary | ICD-10-CM

## 2016-03-11 DIAGNOSIS — D849 Immunodeficiency, unspecified: Secondary | ICD-10-CM | POA: Diagnosis present

## 2016-03-11 DIAGNOSIS — I82401 Acute embolism and thrombosis of unspecified deep veins of right lower extremity: Secondary | ICD-10-CM | POA: Diagnosis not present

## 2016-03-11 DIAGNOSIS — R52 Pain, unspecified: Secondary | ICD-10-CM

## 2016-03-11 DIAGNOSIS — M1990 Unspecified osteoarthritis, unspecified site: Secondary | ICD-10-CM | POA: Diagnosis present

## 2016-03-11 DIAGNOSIS — E875 Hyperkalemia: Secondary | ICD-10-CM

## 2016-03-11 DIAGNOSIS — R451 Restlessness and agitation: Secondary | ICD-10-CM | POA: Diagnosis not present

## 2016-03-11 DIAGNOSIS — E1140 Type 2 diabetes mellitus with diabetic neuropathy, unspecified: Secondary | ICD-10-CM | POA: Diagnosis present

## 2016-03-11 DIAGNOSIS — I824Z1 Acute embolism and thrombosis of unspecified deep veins of right distal lower extremity: Secondary | ICD-10-CM | POA: Diagnosis not present

## 2016-03-11 DIAGNOSIS — R748 Abnormal levels of other serum enzymes: Secondary | ICD-10-CM | POA: Diagnosis not present

## 2016-03-11 DIAGNOSIS — E038 Other specified hypothyroidism: Secondary | ICD-10-CM | POA: Diagnosis not present

## 2016-03-11 DIAGNOSIS — M79609 Pain in unspecified limb: Secondary | ICD-10-CM | POA: Diagnosis not present

## 2016-03-11 LAB — BLOOD GAS, ARTERIAL
ACID-BASE DEFICIT: 2.7 mmol/L — AB (ref 0.0–2.0)
BICARBONATE: 22.4 mmol/L (ref 20.0–28.0)
DRAWN BY: 301361
FIO2: 100
LHR: 26 {breaths}/min
O2 SAT: 95.1 %
PEEP/CPAP: 8 cmH2O
PH ART: 7.363 (ref 7.350–7.450)
Patient temperature: 93.9
VT: 500 mL
pCO2 arterial: 38.9 mmHg (ref 32.0–48.0)
pO2, Arterial: 77 mmHg — ABNORMAL LOW (ref 83.0–108.0)

## 2016-03-11 LAB — BASIC METABOLIC PANEL
ANION GAP: 20 — AB (ref 5–15)
Anion gap: 18 — ABNORMAL HIGH (ref 5–15)
BUN: 103 mg/dL — ABNORMAL HIGH (ref 6–20)
BUN: 106 mg/dL — ABNORMAL HIGH (ref 6–20)
CALCIUM: 10.1 mg/dL (ref 8.9–10.3)
CHLORIDE: 99 mmol/L — AB (ref 101–111)
CO2: 19 mmol/L — AB (ref 22–32)
CO2: 19 mmol/L — AB (ref 22–32)
CREATININE: 9.61 mg/dL — AB (ref 0.44–1.00)
Calcium: 9.4 mg/dL (ref 8.9–10.3)
Chloride: 100 mmol/L — ABNORMAL LOW (ref 101–111)
Creatinine, Ser: 9.43 mg/dL — ABNORMAL HIGH (ref 0.44–1.00)
GFR calc non Af Amer: 4 mL/min — ABNORMAL LOW (ref >60)
GFR, EST AFRICAN AMERICAN: 5 mL/min — AB (ref >60)
GFR, EST AFRICAN AMERICAN: 5 mL/min — AB (ref >60)
GFR, EST NON AFRICAN AMERICAN: 4 mL/min — AB (ref >60)
GLUCOSE: 248 mg/dL — AB (ref 65–99)
Glucose, Bld: 293 mg/dL — ABNORMAL HIGH (ref 65–99)
Potassium: >7.5 mmol/L (ref 3.5–5.1)
Sodium: 139 mmol/L (ref 135–145)
Sodium: 136 mmol/L (ref 135–145)

## 2016-03-11 LAB — RENAL FUNCTION PANEL
Albumin: 2.9 g/dL — ABNORMAL LOW (ref 3.5–5.0)
Anion gap: 15 (ref 5–15)
BUN: 88 mg/dL — AB (ref 6–20)
CALCIUM: 8.6 mg/dL — AB (ref 8.9–10.3)
CO2: 24 mmol/L (ref 22–32)
Chloride: 99 mmol/L — ABNORMAL LOW (ref 101–111)
Creatinine, Ser: 7.98 mg/dL — ABNORMAL HIGH (ref 0.44–1.00)
GFR calc non Af Amer: 5 mL/min — ABNORMAL LOW (ref >60)
GFR, EST AFRICAN AMERICAN: 6 mL/min — AB (ref >60)
GLUCOSE: 142 mg/dL — AB (ref 65–99)
PHOSPHORUS: 9.2 mg/dL — AB (ref 2.5–4.6)
Potassium: 6.6 mmol/L (ref 3.5–5.1)
Sodium: 138 mmol/L (ref 135–145)

## 2016-03-11 LAB — POCT I-STAT, CHEM 8
BUN: 93 mg/dL — ABNORMAL HIGH (ref 6–20)
BUN: 86 mg/dL — ABNORMAL HIGH (ref 6–20)
BUN: 78 mg/dL — ABNORMAL HIGH (ref 6–20)
CALCIUM ION: 1.14 mmol/L — AB (ref 1.15–1.40)
CREATININE: 8.5 mg/dL — AB (ref 0.44–1.00)
Calcium, Ion: 1.09 mmol/L — ABNORMAL LOW (ref 1.15–1.40)
Calcium, Ion: 1.06 mmol/L — ABNORMAL LOW (ref 1.15–1.40)
Chloride: 99 mmol/L — ABNORMAL LOW (ref 101–111)
Chloride: 100 mmol/L — ABNORMAL LOW (ref 101–111)
Chloride: 99 mmol/L — ABNORMAL LOW (ref 101–111)
Creatinine, Ser: 8.7 mg/dL — ABNORMAL HIGH (ref 0.44–1.00)
Creatinine, Ser: 7.6 mg/dL — ABNORMAL HIGH (ref 0.44–1.00)
GLUCOSE: 138 mg/dL — AB (ref 65–99)
GLUCOSE: 134 mg/dL — AB (ref 65–99)
Glucose, Bld: 180 mg/dL — ABNORMAL HIGH (ref 65–99)
HCT: 31 % — ABNORMAL LOW (ref 36.0–46.0)
HCT: 33 % — ABNORMAL LOW (ref 36.0–46.0)
HEMATOCRIT: 31 % — AB (ref 36.0–46.0)
HEMOGLOBIN: 10.5 g/dL — AB (ref 12.0–15.0)
HEMOGLOBIN: 10.5 g/dL — AB (ref 12.0–15.0)
HEMOGLOBIN: 11.2 g/dL — AB (ref 12.0–15.0)
POTASSIUM: 6.4 mmol/L — AB (ref 3.5–5.1)
Potassium: 6 mmol/L — ABNORMAL HIGH (ref 3.5–5.1)
Potassium: 5.9 mmol/L — ABNORMAL HIGH (ref 3.5–5.1)
Sodium: 138 mmol/L (ref 135–145)
Sodium: 138 mmol/L (ref 135–145)
Sodium: 137 mmol/L (ref 135–145)
TCO2: 25 mmol/L (ref 0–100)
TCO2: 28 mmol/L (ref 0–100)
TCO2: 26 mmol/L (ref 0–100)

## 2016-03-11 LAB — CBC
HEMATOCRIT: 32.1 % — AB (ref 36.0–46.0)
Hemoglobin: 9.9 g/dL — ABNORMAL LOW (ref 12.0–15.0)
MCH: 28.2 pg (ref 26.0–34.0)
MCHC: 30.8 g/dL (ref 30.0–36.0)
MCV: 91.5 fL (ref 78.0–100.0)
Platelets: 263 10*3/uL (ref 150–400)
RBC: 3.51 MIL/uL — AB (ref 3.87–5.11)
RDW: 19.2 % — ABNORMAL HIGH (ref 11.5–15.5)
WBC: 16.9 10*3/uL — ABNORMAL HIGH (ref 4.0–10.5)

## 2016-03-11 LAB — I-STAT CHEM 8, ED
BUN: 104 mg/dL — ABNORMAL HIGH (ref 6–20)
BUN: 108 mg/dL — ABNORMAL HIGH (ref 6–20)
CALCIUM ION: 1.22 mmol/L (ref 1.15–1.40)
CHLORIDE: 102 mmol/L (ref 101–111)
Calcium, Ion: 1.25 mmol/L (ref 1.15–1.40)
Chloride: 102 mmol/L (ref 101–111)
Creatinine, Ser: 9.9 mg/dL — ABNORMAL HIGH (ref 0.44–1.00)
Creatinine, Ser: 9.9 mg/dL — ABNORMAL HIGH (ref 0.44–1.00)
GLUCOSE: 240 mg/dL — AB (ref 65–99)
Glucose, Bld: 312 mg/dL — ABNORMAL HIGH (ref 65–99)
HCT: 32 % — ABNORMAL LOW (ref 36.0–46.0)
HEMATOCRIT: 30 % — AB (ref 36.0–46.0)
HEMOGLOBIN: 10.9 g/dL — AB (ref 12.0–15.0)
Hemoglobin: 10.2 g/dL — ABNORMAL LOW (ref 12.0–15.0)
POTASSIUM: 8.4 mmol/L — AB (ref 3.5–5.1)
Potassium: 7.4 mmol/L (ref 3.5–5.1)
SODIUM: 135 mmol/L (ref 135–145)
SODIUM: 132 mmol/L — AB (ref 135–145)
TCO2: 22 mmol/L (ref 0–100)
TCO2: 24 mmol/L (ref 0–100)

## 2016-03-11 LAB — PROTIME-INR
INR: 1.45
Prothrombin Time: 17.8 seconds — ABNORMAL HIGH (ref 11.4–15.2)

## 2016-03-11 LAB — I-STAT ARTERIAL BLOOD GAS, ED
ACID-BASE DEFICIT: 7 mmol/L — AB (ref 0.0–2.0)
BICARBONATE: 21.2 mmol/L (ref 20.0–28.0)
O2 Saturation: 94 %
PH ART: 7.223 — AB (ref 7.350–7.450)
PO2 ART: 84 mmHg (ref 83.0–108.0)
TCO2: 23 mmol/L (ref 0–100)
pCO2 arterial: 51 mmHg — ABNORMAL HIGH (ref 32.0–48.0)

## 2016-03-11 LAB — ECHOCARDIOGRAM COMPLETE

## 2016-03-11 LAB — I-STAT TROPONIN, ED: TROPONIN I, POC: 0.07 ng/mL (ref 0.00–0.08)

## 2016-03-11 LAB — PROCALCITONIN: PROCALCITONIN: 0.13 ng/mL

## 2016-03-11 LAB — GLUCOSE, CAPILLARY
GLUCOSE-CAPILLARY: 87 mg/dL (ref 65–99)
Glucose-Capillary: 222 mg/dL — ABNORMAL HIGH (ref 65–99)

## 2016-03-11 LAB — APTT: aPTT: 33 seconds (ref 24–36)

## 2016-03-11 LAB — I-STAT CG4 LACTIC ACID, ED
Lactic Acid, Venous: 7.49 mmol/L (ref 0.5–1.9)
Lactic Acid, Venous: 1.64 mmol/L (ref 0.5–1.9)

## 2016-03-11 LAB — TROPONIN I
Troponin I: 0.21 ng/mL (ref <0.03)
Troponin I: 0.53 ng/mL (ref <0.03)

## 2016-03-11 MED ORDER — ETOMIDATE 2 MG/ML IV SOLN
INTRAVENOUS | Status: AC | PRN
  Administered 2016-03-11: 20 mg via INTRAVENOUS

## 2016-03-11 MED ORDER — VANCOMYCIN HCL IN DEXTROSE 1-5 GM/200ML-% IV SOLN
1000.0000 mg | INTRAVENOUS | Status: DC
  Administered 2016-03-11 – 2016-03-13 (×3): 1000 mg via INTRAVENOUS
  Filled 2016-03-11 (×5): qty 200

## 2016-03-11 MED ORDER — ORAL CARE MOUTH RINSE
15.0000 mL | OROMUCOSAL | Status: DC
  Administered 2016-03-11 – 2016-03-13 (×16): 15 mL via OROMUCOSAL

## 2016-03-11 MED ORDER — NOREPINEPHRINE BITARTRATE 1 MG/ML IV SOLN
0.0000 ug/min | INTRAVENOUS | Status: DC
  Filled 2016-03-11: qty 4

## 2016-03-11 MED ORDER — DEXTROSE 50 % IV SOLN
INTRAVENOUS | Status: AC
Start: 2016-03-11 — End: 2016-03-11
  Filled 2016-03-11: qty 50

## 2016-03-11 MED ORDER — CHLORHEXIDINE GLUCONATE CLOTH 2 % EX PADS
6.0000 | MEDICATED_PAD | Freq: Every day | CUTANEOUS | Status: AC
  Administered 2016-03-12 – 2016-03-16 (×5): 6 via TOPICAL

## 2016-03-11 MED ORDER — CHLORHEXIDINE GLUCONATE 0.12% ORAL RINSE (MEDLINE KIT)
15.0000 mL | Freq: Two times a day (BID) | OROMUCOSAL | Status: DC
  Administered 2016-03-11: 15 mL via OROMUCOSAL

## 2016-03-11 MED ORDER — DEXTROSE 50 % IV SOLN
50.0000 mL | Freq: Once | INTRAVENOUS | Status: AC
  Administered 2016-03-11: 50 mL via INTRAVENOUS

## 2016-03-11 MED ORDER — PRISMASOL BGK 0/2.5 32-2.5 MEQ/L IV SOLN
INTRAVENOUS | Status: DC
  Administered 2016-03-11: 13:00:00 via INTRAVENOUS_CENTRAL
  Filled 2016-03-11 (×2): qty 5000

## 2016-03-11 MED ORDER — NOREPINEPHRINE BITARTRATE 1 MG/ML IV SOLN
0.0000 ug/min | INTRAVENOUS | Status: DC
  Administered 2016-03-11: 30 ug/min via INTRAVENOUS
  Administered 2016-03-11: 40 ug/min via INTRAVENOUS
  Filled 2016-03-11: qty 4

## 2016-03-11 MED ORDER — SODIUM CHLORIDE 0.9 % IV SOLN
10.0000 mg | INTRAVENOUS | Status: DC
  Administered 2016-03-11 – 2016-03-15 (×5): 10 mg via INTRAVENOUS
  Filled 2016-03-11 (×8): qty 1

## 2016-03-11 MED ORDER — EPINEPHRINE PF 1 MG/10ML IJ SOSY
PREFILLED_SYRINGE | INTRAMUSCULAR | Status: DC | PRN
  Administered 2016-03-11 (×13): 1 mg via INTRAVENOUS

## 2016-03-11 MED ORDER — SODIUM BICARBONATE 8.4 % IV SOLN
INTRAVENOUS | Status: DC
  Administered 2016-03-11: 10:00:00 via INTRAVENOUS
  Filled 2016-03-11 (×2): qty 150

## 2016-03-11 MED ORDER — AMIODARONE HCL IN DEXTROSE 360-4.14 MG/200ML-% IV SOLN: 60.0000 mg/h | INTRAVENOUS | Status: DC

## 2016-03-11 MED ORDER — FENTANYL CITRATE (PF) 100 MCG/2ML IJ SOLN
100.0000 ug | INTRAMUSCULAR | Status: AC | PRN
  Administered 2016-03-11 (×3): 100 ug via INTRAVENOUS
  Filled 2016-03-11 (×2): qty 2

## 2016-03-11 MED ORDER — AMIODARONE HCL IN DEXTROSE 360-4.14 MG/200ML-% IV SOLN: 30.0000 mg/h | INTRAVENOUS | Status: DC

## 2016-03-11 MED ORDER — FENTANYL CITRATE (PF) 100 MCG/2ML IJ SOLN
100.0000 ug | INTRAMUSCULAR | Status: DC | PRN
  Administered 2016-03-11 – 2016-03-12 (×5): 100 ug via INTRAVENOUS
  Filled 2016-03-11 (×6): qty 2

## 2016-03-11 MED ORDER — PRISMASOL BGK 0/2.5 32-2.5 MEQ/L IV SOLN
INTRAVENOUS | Status: DC
  Administered 2016-03-11 – 2016-03-12 (×10): via INTRAVENOUS_CENTRAL
  Filled 2016-03-11 (×12): qty 5000

## 2016-03-11 MED ORDER — CHLORHEXIDINE GLUCONATE 0.12% ORAL RINSE (MEDLINE KIT): 15.0000 mL | Freq: Two times a day (BID) | OROMUCOSAL | Status: DC

## 2016-03-11 MED ORDER — PIPERACILLIN-TAZOBACTAM 4.5 G IVPB
4.5000 g | Freq: Once | INTRAVENOUS | Status: AC
  Administered 2016-03-11: 4.5 g via INTRAVENOUS
  Filled 2016-03-11: qty 100

## 2016-03-11 MED ORDER — EPINEPHRINE PF 1 MG/ML IJ SOLN
0.5000 ug/min | INTRAVENOUS | Status: DC
  Administered 2016-03-11: 5 ug/min via INTRAVENOUS
  Filled 2016-03-11: qty 4

## 2016-03-11 MED ORDER — INSULIN ASPART 100 UNIT/ML ~~LOC~~ SOLN
10.0000 [IU] | Freq: Once | SUBCUTANEOUS | Status: AC
  Administered 2016-03-11: 10 [IU] via INTRAVENOUS

## 2016-03-11 MED ORDER — HEPARIN SODIUM (PORCINE) 5000 UNIT/ML IJ SOLN
5000.0000 [IU] | Freq: Three times a day (TID) | INTRAMUSCULAR | Status: DC
  Administered 2016-03-11 – 2016-03-13 (×7): 5000 [IU] via SUBCUTANEOUS
  Filled 2016-03-11 (×7): qty 1

## 2016-03-11 MED ORDER — SODIUM CHLORIDE 0.9 % IV SOLN
INTRAVENOUS | Status: DC
  Administered 2016-03-11: 19:00:00 via INTRAVENOUS

## 2016-03-11 MED ORDER — DEXTROSE 5 % IV SOLN
INTRAVENOUS | Status: AC | PRN
  Administered 2016-03-11: 300 mg via INTRAVENOUS

## 2016-03-11 MED ORDER — DEXMEDETOMIDINE HCL IN NACL 400 MCG/100ML IV SOLN
0.4000 ug/kg/h | INTRAVENOUS | Status: DC
  Administered 2016-03-11 (×2): 0.4 ug/kg/h via INTRAVENOUS
  Administered 2016-03-12: 1 ug/kg/h via INTRAVENOUS
  Administered 2016-03-12: 0.5 ug/kg/h via INTRAVENOUS
  Administered 2016-03-12: 0.7 ug/kg/h via INTRAVENOUS
  Administered 2016-03-13 (×2): 0.8 ug/kg/h via INTRAVENOUS
  Filled 2016-03-11 (×2): qty 100
  Filled 2016-03-11: qty 50
  Filled 2016-03-11 (×3): qty 100
  Filled 2016-03-11 (×2): qty 50
  Filled 2016-03-11: qty 100

## 2016-03-11 MED ORDER — MUPIROCIN 2 % EX OINT
1.0000 "application " | TOPICAL_OINTMENT | Freq: Two times a day (BID) | CUTANEOUS | Status: AC
  Administered 2016-03-11 – 2016-03-16 (×10): 1 via NASAL
  Filled 2016-03-11: qty 22

## 2016-03-11 MED ORDER — CALCIUM GLUCONATE 10 % IV SOLN
1.0000 g | Freq: Once | INTRAVENOUS | Status: AC
  Administered 2016-03-11: 1 g via INTRAVENOUS
  Filled 2016-03-11: qty 10

## 2016-03-11 MED ORDER — HEPARIN SODIUM (PORCINE) 1000 UNIT/ML DIALYSIS
1000.0000 [IU] | INTRAMUSCULAR | Status: DC | PRN
  Administered 2016-03-14: 3800 [IU] via INTRAVENOUS_CENTRAL
  Filled 2016-03-11: qty 4
  Filled 2016-03-11 (×2): qty 6

## 2016-03-11 MED ORDER — CHLORHEXIDINE GLUCONATE 0.12% ORAL RINSE (MEDLINE KIT)
15.0000 mL | Freq: Two times a day (BID) | OROMUCOSAL | Status: DC
  Administered 2016-03-11 – 2016-03-13 (×5): 15 mL via OROMUCOSAL

## 2016-03-11 MED ORDER — SODIUM CHLORIDE 0.9% FLUSH: 10.0000 mL | INTRAVENOUS | Status: DC | PRN

## 2016-03-11 MED ORDER — CALCIUM CHLORIDE 10 % IV SOLN
INTRAVENOUS | Status: AC | PRN
  Administered 2016-03-11 (×3): 1 g via INTRAVENOUS

## 2016-03-11 MED ORDER — EPINEPHRINE PF 1 MG/10ML IJ SOSY
PREFILLED_SYRINGE | INTRAMUSCULAR | Status: AC
  Filled 2016-03-11: qty 30

## 2016-03-11 MED ORDER — INSULIN ASPART 100 UNIT/ML ~~LOC~~ SOLN
10.0000 [IU] | Freq: Once | SUBCUTANEOUS | Status: AC
  Administered 2016-03-11: 10 [IU] via INTRAVENOUS
  Filled 2016-03-11: qty 1

## 2016-03-11 MED ORDER — SODIUM CHLORIDE 0.9 % IV SOLN
INTRAVENOUS | Status: DC
  Administered 2016-03-11: 18:00:00 via INTRAVENOUS

## 2016-03-11 MED ORDER — SODIUM BICARBONATE 8.4 % IV SOLN
INTRAVENOUS | Status: AC
  Administered 2016-03-11: 50 meq via INTRAVENOUS
  Filled 2016-03-11: qty 50

## 2016-03-11 MED ORDER — PRISMASOL BGK 0/2.5 32-2.5 MEQ/L IV SOLN
INTRAVENOUS | Status: DC
  Administered 2016-03-11 – 2016-03-12 (×2): via INTRAVENOUS_CENTRAL
  Filled 2016-03-11 (×2): qty 5000

## 2016-03-11 MED ORDER — VANCOMYCIN HCL IN DEXTROSE 1-5 GM/200ML-% IV SOLN
1000.0000 mg | Freq: Once | INTRAVENOUS | Status: AC
  Administered 2016-03-11: 1000 mg via INTRAVENOUS
  Filled 2016-03-11: qty 200

## 2016-03-11 MED ORDER — SODIUM BICARBONATE 8.4 % IV SOLN
INTRAVENOUS | Status: AC | PRN
  Administered 2016-03-11 (×5): 50 meq via INTRAVENOUS

## 2016-03-11 MED ORDER — PIPERACILLIN-TAZOBACTAM 3.375 G IVPB 30 MIN
3.3750 g | Freq: Four times a day (QID) | INTRAVENOUS | Status: DC
  Administered 2016-03-11 – 2016-03-14 (×11): 3.375 g via INTRAVENOUS
  Filled 2016-03-11 (×13): qty 50

## 2016-03-11 MED ORDER — ATROPINE SULFATE 1 MG/ML IJ SOLN
INTRAMUSCULAR | Status: AC | PRN
  Administered 2016-03-11 (×3): 1 mg via INTRAVENOUS

## 2016-03-11 MED ORDER — SODIUM CHLORIDE 0.9 % FOR CRRT
INTRAVENOUS_CENTRAL | Status: DC | PRN
  Filled 2016-03-11: qty 1000

## 2016-03-11 MED ORDER — SODIUM POLYSTYRENE SULFONATE 15 GM/60ML PO SUSP
30.0000 g | Freq: Once | ORAL | Status: AC
  Administered 2016-03-11: 30 g via ORAL
  Filled 2016-03-11: qty 120

## 2016-03-11 MED ORDER — DEXTROSE 50 % IV SOLN
INTRAVENOUS | Status: AC | PRN
  Administered 2016-03-11: 50 mL via INTRAVENOUS

## 2016-03-11 MED ORDER — ORAL CARE MOUTH RINSE
15.0000 mL | OROMUCOSAL | Status: DC
  Administered 2016-03-11 (×3): 15 mL via OROMUCOSAL

## 2016-03-11 MED ORDER — SODIUM CHLORIDE 0.9 % IV SOLN
INTRAVENOUS | Status: AC | PRN
  Administered 2016-03-11: 1000 mL via INTRAVENOUS

## 2016-03-11 MED ORDER — SODIUM CHLORIDE 0.9% FLUSH
10.0000 mL | Freq: Two times a day (BID) | INTRAVENOUS | Status: DC
Start: 2016-03-11 — End: 2016-03-16
  Administered 2016-03-11 – 2016-03-12 (×2): 10 mL
  Administered 2016-03-12: 20 mL
  Administered 2016-03-13 – 2016-03-15 (×5): 10 mL
  Administered 2016-03-15: 20 mL

## 2016-03-11 MED ORDER — ROCURONIUM BROMIDE 50 MG/5ML IV SOLN
INTRAVENOUS | Status: AC | PRN
  Administered 2016-03-11: 100 mg via INTRAVENOUS

## 2016-03-11 MED ORDER — MAGNESIUM SULFATE 50 % IJ SOLN
INTRAMUSCULAR | Status: AC | PRN
  Administered 2016-03-11: 2 g via INTRAVENOUS

## 2016-03-11 MED ORDER — ORAL CARE MOUTH RINSE: 15.0000 mL | Freq: Four times a day (QID) | OROMUCOSAL | Status: DC

## 2016-03-11 NOTE — Progress Notes (Signed)
  Echocardiogram 2D Echocardiogram has been performed.  Valerie Perez 03/11/2016, 3:31 PM

## 2016-03-11 NOTE — ED Notes (Signed)
arctic sun pads placed

## 2016-03-11 NOTE — ED Notes (Signed)
RT at bedside for decreased spo2 and arterial line placement

## 2016-03-11 NOTE — Code Documentation (Signed)
Weak pulses present. cpr ongoing

## 2016-03-11 NOTE — ED Notes (Signed)
Attempted report 

## 2016-03-11 NOTE — ED Notes (Signed)
Ice packs applied

## 2016-03-11 NOTE — Code Documentation (Signed)
Dr linker attempting intubation

## 2016-03-11 NOTE — Progress Notes (Signed)
eLink Physician-Brief Progress Note Patient Name: Valerie Perez DOB: 02-12-1961 MRN: [id number]   Date of Service  03/11/2016  HPI/Events of Note  Pt awake & following commands Being cooled to 36, causing shivering & agitation  eICU Interventions  Change goal temp to 37 - avoid fever     Intervention Category Major Interventions: Change in mental status - evaluation and management  ALVA,RAKESH V. 03/11/2016, 7:34 PM

## 2016-03-11 NOTE — ED Notes (Signed)
Critical care at bedside  

## 2016-03-11 NOTE — ED Provider Notes (Signed)
[facility] DEPT Provider Note   CSN: [id number] Arrival date & time: 03/11/16  1027     History   Chief Complaint Chief Complaint  Patient presents with  . Cardiac Arrest    HPI Valerie Perez is a 56 y.o. female.  HPI   A LEVEL 5 CAVEAT PERATAINS DUE TO CARDIAC ARREST.  Pt presents with ongoing CPR.  Per EMS when they arrived she had decreased responsiveness, was bradycardic.  In ambulance she arrested, had vfib and vtach- was defibrillated a total of 4 times.  She had one dose of epi.  She arrives with BVM, NP airway.  Pt is on dialysis and was due to go today, but family states she called to cancel as she was not feeling well.    Past Medical History:  Diagnosis Date  . Anemia   . Arthritis    osteoarthritis  . Depression   . Diabetes mellitus without complication ([place])    Type II  . Dyspnea    with exertion  . Fatty liver   . Heart murmur    just recently told she had a heart murmur, but had never heard this before  . High cholesterol   . Hypertension   . Hypothyroid   . Neuropathy ([place])   . Pneumonia   . PONV (postoperative nausea and vomiting)   . Renal disorder    dialysis on Tuesday/Thursday/Saturday  . Sleep apnea    uses cpap    Patient Active Problem List   Diagnosis Date Noted  . Cardiac arrest ([place]) 03/11/2016  . Infection associated with peritoneal dialysis catheter ([place])   . Fever in adult 04/28/2015  . HTN (hypertension) 04/28/2015  . Diabetes ([place]) 04/28/2015  . Hypothyroidism 04/28/2015  . Immunosuppression ([place]) 04/28/2015  . ESRD on dialysis ([place]) 04/28/2015  . ALLERGIC RHINITIS 08/15/2008  . Obstructive sleep apnea 05/29/2008  . UNSPECIFIED CONJUNCTIVITIS 05/29/2008  . HYPERSOMNIA 05/29/2008  . KIDNEY TRANSPLANTATION, HX OF 05/29/2008    Past Surgical History:  Procedure Laterality Date  . ABDOMINAL HYSTERECTOMY    . AV FISTULA PLACEMENT Right 10/03/2015   Procedure: CREATION-RIGHT BRACHIOCEPHALIC ARTERIOVENOUS (AV) FISTULA;   Surgeon: Conrad Baywood, MD;  Location: [facility];  Service: Vascular;  Laterality: Right;  . CHOLECYSTECTOMY  2000  . COLONOSCOPY    . DEBRIDEMENT OF ABDOMINAL WALL ABSCESS  01/14/2016  . FISTULA SUPERFICIALIZATION Right 01/30/2016   Procedure: FISTULA SUPERFICIALIZATION RIGHT UPPER ARM;  Surgeon: Conrad [place] Lake, MD;  Location: [facility];  Service: Vascular;  Laterality: Right;  . KIDNEY TRANSPLANT  2000    OB History    No data available       Home Medications    Prior to Admission medications   Medication Sig Start Date End Date Taking? Authorizing Provider  allopurinol (ZYLOPRIM) 300 MG tablet Take 300 mg by mouth daily.   Yes Historical Provider, MD  amLODipine (NORVASC) 5 MG tablet Take 10 mg by mouth daily.   Yes Historical Provider, MD  buPROPion (WELLBUTRIN XL) 150 MG 24 hr tablet Take 150 mg by mouth at bedtime.   Yes Historical Provider, MD  carvedilol (COREG) 12.5 MG tablet Take 12.5 mg by mouth 2 (two) times daily with a meal.   Yes Historical Provider, MD  gabapentin (NEURONTIN) 100 MG capsule Take 300 mg by mouth at bedtime.   Yes Historical Provider, MD  levothyroxine (SYNTHROID, LEVOTHROID) 75 MCG tablet Take 1 tablet (75 mcg total) by mouth daily before breakfast. 04/30/15  Yes Hart Carwin  Lenore Cordia, MD  losartan (COZAAR) 25 MG tablet Take 25 mg by mouth. 03/01/16  Yes Historical Provider, MD  montelukast (SINGULAIR) 10 MG tablet Take 10 mg by mouth at bedtime.   Yes Historical Provider, MD  acetaminophen (TYLENOL) 500 MG tablet Take 500 mg by mouth every 6 (six) hours as needed for moderate pain.    Historical Provider, MD  aspirin EC 81 MG tablet Take 81 mg by mouth daily.    Historical Provider, MD  DULoxetine (CYMBALTA) 60 MG capsule Take 60 mg by mouth at bedtime.    Historical Provider, MD  fluticasone (FLONASE) 50 MCG/ACT nasal spray Place 1 spray into both nostrils every morning.    Historical Provider, MD  insulin glargine (LANTUS) 100 UNIT/ML injection Inject 15 Units into the  skin 2 (two) times daily.     Historical Provider, MD  insulin lispro (HUMALOG) 100 UNIT/ML injection Inject 12 Units into the skin 3 (three) times daily before meals. Sliding scale: 12 units with each meal. Add 1 unit every 25 point BG > 150. Ex: BG 175, add 1 unit = 13 unit; BG 200, add 2 unit = 14 unit.    Historical Provider, MD  labetalol (NORMODYNE) 200 MG tablet Take 200 mg by mouth 3 (three) times daily.    Historical Provider, MD  loratadine (CLARITIN) 10 MG tablet Take 10 mg by mouth every morning.    Historical Provider, MD  oxyCODONE-acetaminophen (PERCOCET/ROXICET) 5-325 MG tablet Take 1 tablet by mouth every 6 (six) hours as needed. 01/30/16   Ulyses Amor, PA-C  polyethylene glycol (MIRALAX / GLYCOLAX) packet Take 17 g by mouth daily.    Historical Provider, MD  pravastatin (PRAVACHOL) 40 MG tablet Take 40 mg by mouth at bedtime.    Historical Provider, MD  predniSONE (DELTASONE) 20 MG tablet Take 20 mg by mouth daily with breakfast. For five days for gout flare up    Historical Provider, MD  predniSONE (DELTASONE) 5 MG tablet Take 5 mg by mouth daily with breakfast.    Historical Provider, MD  SENSIPAR 30 MG tablet Take 1 tablet by mouth daily. 07/30/15   Historical Provider, MD  sevelamer carbonate (RENVELA) 800 MG tablet Take 1,600 mg by mouth 3 (three) times daily with meals.     Historical Provider, MD    Family History Family History  Problem Relation Age of Onset  . Colon cancer Mother   . Thyroid cancer Father     Social History Social History  Substance Use Topics  . Smoking status: Never Smoker  . Smokeless tobacco: Never Used  . Alcohol use No     Allergies   Tape and Aspirin   Review of Systems Review of Systems  UNABLE TO OBTAIN ROS DUE TO LEVEL 5 CAVEAT   Physical Exam Updated Vital Signs BP (!) 185/66   Pulse 87   Temp 98.6 F (37 C)   Resp 10   Ht 5' 5"  (1.651 m)   Wt 108 kg   SpO2 100%   BMI 39.62 kg/m  Vitals reviewed Physical  Exam Physical Examination: General appearance - unresponsive Mental status - unresponsive Eyes - pupils equal bilaterally and unresponsive Mouth - mucous membranes moist, pharynx normal without lesions, NP airway in place with bleeding on arrival, BVM ongoing Chest - + bilateral chest rise with BVM, coarse breath sounds bilaterally after intubation Heart - normal rate, regular rhythm, normal S1, S2, no murmurs, rubs, clicks or gallops Abdomen - distended, wound vac  area in mid abdomen, no wound vac attached Neurological - unresponsive Extremities -no pulses Skin -skin cool, pale  ED Treatments / Results  Labs (all labs ordered are listed, but only abnormal results are displayed) Labs Reviewed  MRSA PCR SCREENING - Abnormal; Notable for the following:       Result Value   MRSA by PCR POSITIVE (*)    All other components within normal limits  BASIC METABOLIC PANEL - Abnormal; Notable for the following:    Potassium >7.5 (*)    Chloride 100 (*)    CO2 19 (*)    Glucose, Bld 248 (*)    BUN 103 (*)    Creatinine, Ser 9.43 (*)    GFR calc non Af Amer 4 (*)    GFR calc Af Amer 5 (*)    Anion gap 20 (*)    All other components within normal limits  CBC - Abnormal; Notable for the following:    WBC 16.9 (*)    RBC 3.51 (*)    Hemoglobin 9.9 (*)    HCT 32.1 (*)    RDW 19.2 (*)    All other components within normal limits  TROPONIN I - Abnormal; Notable for the following:    Troponin I 0.21 (*)    All other components within normal limits  TROPONIN I - Abnormal; Notable for the following:    Troponin I 0.53 (*)    All other components within normal limits  TROPONIN I - Abnormal; Notable for the following:    Troponin I 0.56 (*)    All other components within normal limits  BASIC METABOLIC PANEL - Abnormal; Notable for the following:    Potassium >7.5 (*)    Chloride 99 (*)    CO2 19 (*)    Glucose, Bld 293 (*)    BUN 106 (*)    Creatinine, Ser 9.61 (*)    GFR calc non Af  Amer 4 (*)    GFR calc Af Amer 5 (*)    Anion gap 18 (*)    All other components within normal limits  BASIC METABOLIC PANEL - Abnormal; Notable for the following:    Potassium 5.5 (*)    BUN 68 (*)    Creatinine, Ser 6.43 (*)    Calcium 8.3 (*)    GFR calc non Af Amer 6 (*)    GFR calc Af Amer 7 (*)    All other components within normal limits  PROTIME-INR - Abnormal; Notable for the following:    Prothrombin Time 17.8 (*)    All other components within normal limits  BLOOD GAS, ARTERIAL - Abnormal; Notable for the following:    pO2, Arterial 77.0 (*)    Acid-base deficit 2.7 (*)    All other components within normal limits  RENAL FUNCTION PANEL - Abnormal; Notable for the following:    Potassium 6.6 (*)    Chloride 99 (*)    Glucose, Bld 142 (*)    BUN 88 (*)    Creatinine, Ser 7.98 (*)    Calcium 8.6 (*)    Phosphorus 9.2 (*)    Albumin 2.9 (*)    GFR calc non Af Amer 5 (*)    GFR calc Af Amer 6 (*)    All other components within normal limits  GLUCOSE, CAPILLARY - Abnormal; Notable for the following:    Glucose-Capillary 222 (*)    All other components within normal limits  TROPONIN I - Abnormal; Notable for  the following:    Troponin I 0.50 (*)    All other components within normal limits  CBC - Abnormal; Notable for the following:    WBC 11.1 (*)    RBC 3.28 (*)    Hemoglobin 9.2 (*)    HCT 29.0 (*)    RDW 18.8 (*)    All other components within normal limits  BASIC METABOLIC PANEL - Abnormal; Notable for the following:    Chloride 100 (*)    Glucose, Bld 60 (*)    BUN 59 (*)    Creatinine, Ser 5.93 (*)    Calcium 8.2 (*)    GFR calc non Af Amer 7 (*)    GFR calc Af Amer 8 (*)    All other components within normal limits  MAGNESIUM - Abnormal; Notable for the following:    Magnesium 2.5 (*)    All other components within normal limits  PHOSPHORUS - Abnormal; Notable for the following:    Phosphorus 7.1 (*)    All other components within normal limits   RENAL FUNCTION PANEL - Abnormal; Notable for the following:    BUN 41 (*)    Creatinine, Ser 4.49 (*)    Calcium 7.8 (*)    Phosphorus 6.5 (*)    Albumin 2.6 (*)    GFR calc non Af Amer 10 (*)    GFR calc Af Amer 12 (*)    All other components within normal limits  GLUCOSE, CAPILLARY - Abnormal; Notable for the following:    Glucose-Capillary 58 (*)    All other components within normal limits  GLUCOSE, CAPILLARY - Abnormal; Notable for the following:    Glucose-Capillary 103 (*)    All other components within normal limits  GLUCOSE, CAPILLARY - Abnormal; Notable for the following:    Glucose-Capillary 59 (*)    All other components within normal limits  ALBUMIN - Abnormal; Notable for the following:    Albumin 2.7 (*)    All other components within normal limits  GLUCOSE, CAPILLARY - Abnormal; Notable for the following:    Glucose-Capillary 151 (*)    All other components within normal limits  RENAL FUNCTION PANEL - Abnormal; Notable for the following:    BUN 31 (*)    Creatinine, Ser 3.43 (*)    Calcium 7.9 (*)    Phosphorus 4.8 (*)    Albumin 2.5 (*)    GFR calc non Af Amer 14 (*)    GFR calc Af Amer 16 (*)    All other components within normal limits  CBC - Abnormal; Notable for the following:    WBC 10.6 (*)    RBC 3.12 (*)    Hemoglobin 8.9 (*)    HCT 28.9 (*)    RDW 19.0 (*)    All other components within normal limits  GLUCOSE, CAPILLARY - Abnormal; Notable for the following:    Glucose-Capillary 115 (*)    All other components within normal limits  BLOOD GAS, ARTERIAL - Abnormal; Notable for the following:    pH, Arterial 7.462 (*)    pO2, Arterial 192 (*)    Acid-Base Excess 3.0 (*)    All other components within normal limits  I-STAT CG4 LACTIC ACID, ED - Abnormal; Notable for the following:    Lactic Acid, Venous 7.49 (*)    All other components within normal limits  I-STAT CHEM 8, ED - Abnormal; Notable for the following:    Potassium 7.4 (*)    BUN  104 (*)    Creatinine, Ser 9.90 (*)    Glucose, Bld 240 (*)    Hemoglobin 10.2 (*)    HCT 30.0 (*)    All other components within normal limits  I-STAT CHEM 8, ED - Abnormal; Notable for the following:    Sodium 132 (*)    Potassium 8.4 (*)    BUN 108 (*)    Creatinine, Ser 9.90 (*)    Glucose, Bld 312 (*)    Hemoglobin 10.9 (*)    HCT 32.0 (*)    All other components within normal limits  I-STAT ARTERIAL BLOOD GAS, ED - Abnormal; Notable for the following:    pH, Arterial 7.223 (*)    pCO2 arterial 51.0 (*)    Acid-base deficit 7.0 (*)    All other components within normal limits  POCT I-STAT, CHEM 8 - Abnormal; Notable for the following:    Potassium 6.0 (*)    Chloride 99 (*)    BUN 93 (*)    Creatinine, Ser 8.50 (*)    Glucose, Bld 180 (*)    Calcium, Ion 1.09 (*)    Hemoglobin 10.5 (*)    HCT 31.0 (*)    All other components within normal limits  POCT I-STAT, CHEM 8 - Abnormal; Notable for the following:    Potassium 6.4 (*)    Chloride 100 (*)    BUN 86 (*)    Creatinine, Ser 8.70 (*)    Glucose, Bld 138 (*)    Calcium, Ion 1.06 (*)    Hemoglobin 10.5 (*)    HCT 31.0 (*)    All other components within normal limits  POCT I-STAT, CHEM 8 - Abnormal; Notable for the following:    Potassium 5.9 (*)    Chloride 99 (*)    BUN 78 (*)    Creatinine, Ser 7.60 (*)    Glucose, Bld 134 (*)    Calcium, Ion 1.14 (*)    Hemoglobin 11.2 (*)    HCT 33.0 (*)    All other components within normal limits  POCT I-STAT 3, ART BLOOD GAS (G3+) - Abnormal; Notable for the following:    pH, Arterial 7.477 (*)    pO2, Arterial 120.0 (*)    Bicarbonate 28.6 (*)    Acid-Base Excess 5.0 (*)    All other components within normal limits  CULTURE, BLOOD (ROUTINE X 2)  CULTURE, BLOOD (ROUTINE X 2)  URINE CULTURE  CULTURE, RESPIRATORY (NON-EXPECTORATED)  APTT  PROCALCITONIN  PROCALCITONIN  GLUCOSE, CAPILLARY  GLUCOSE, CAPILLARY  GLUCOSE, CAPILLARY  GLUCOSE, CAPILLARY  MAGNESIUM   PROCALCITONIN  GLUCOSE, CAPILLARY  GLUCOSE, CAPILLARY  GLUCOSE, CAPILLARY  GLUCOSE, CAPILLARY  GLUCOSE, CAPILLARY  GLUCOSE, CAPILLARY  URINALYSIS, ROUTINE W REFLEX MICROSCOPIC  RENAL FUNCTION PANEL  I-STAT TROPOININ, ED  I-STAT CG4 LACTIC ACID, ED    EKG  EKG Interpretation  Date/Time:  Tuesday March 11 2016 09:50:38 EST Ventricular Rate:  64 PR Interval:    QRS Duration: 183 QT Interval:  550 QTC Calculation: 568 R Axis:   -87 Text Interpretation:  Atrial fibrillation Nonspecific IVCD with LAD Repol abnrm, severe global ischemia (LM/MVD) widened QRS, more regular rhythm than most recent prior  Confirmed by [facility]  MD, MARTHA [id number]) on 03/11/2016 10:51:02 AM       Radiology Ct Head Wo Contrast  Result Date: 03/11/2016 CLINICAL DATA:  Dialysis patient, vomiting, recent CPR EXAM: CT HEAD WITHOUT CONTRAST TECHNIQUE: Contiguous axial images were obtained from the base of  the skull through the vertex without intravenous contrast. COMPARISON:  None available FINDINGS: Brain: Mild brain atrophy without acute intracranial hemorrhage, mass lesion, definite infarction, midline shift, herniation, hydrocephalus, or extra-axial fluid collection. Normal gray-white matter differentiation. Cisterns remain patent. No cerebellar abnormality. Vascular: Intracranial atherosclerosis noted. Skull: Normal. Negative for fracture or focal lesion. Sinuses/Orbits: Orbits are symmetric. Scattered pansinus disease with small diffuse air-fluid levels can be seen with sinusitis. Other: None. IMPRESSION: Mild brain atrophy.  No acute intracranial process. Scattered sinus mucosal thickening and numerous small air-fluid levels suggesting sinusitis. Electronically Signed   By: Jerilynn Mages.  Shick M.D.   On: 03/11/2016 12:18   Dg Chest Port 1 View  Result Date: 03/12/2016 CLINICAL DATA:  Hypoxia EXAM: PORTABLE CHEST 1 VIEW COMPARISON:  March 11, 2016 FINDINGS: Endotracheal tube tip is 3.1 cm above the carina. Central  catheter tip is at the cavoatrial junction. Nasogastric tube tip and side port below the diaphragm. No pneumothorax. There are bilateral pleural effusions with mild pulmonary edema ; there is slightly less interstitial pulmonary edema compared to 1 day prior. There is stable cardiomegaly with mild pulmonary venous hypertension. No new opacity. No evident adenopathy. No bone lesions. IMPRESSION: Tube and catheter positions as described without pneumothorax. Slightly less edema compared to 1 day prior. There are pleural effusions bilaterally as well as cardiomegaly and pulmonary venous hypertension consistent with a degree of persistent congestive heart failure. No new opacity. Electronically Signed   By: Lowella Grip III M.D.   On: 03/12/2016 07:40   Dg Chest Port 1 View  Result Date: 03/11/2016 CLINICAL DATA:  Central line placement EXAM: PORTABLE CHEST 1 VIEW COMPARISON:  03/11/2016 FINDINGS: Endotracheal tube in good position. Right jugular dual lumen central venous catheter tip in the lower SVC unchanged Interval placement of left jugular central venous catheter with the tip in the mid SVC. No pneumothorax. Diffuse bilateral airspace disease shows mild interval improvement especially in the right upper lobe. There remains significant consolidation in both lung bases. No significant pleural effusion. NG tube in the stomach. IMPRESSION: Left jugular central venous catheter tip in the SVC. No pneumothorax. Diffuse bilateral airspace disease shows partial clearing in the upper lobes but remains significant in the lower lobes. Endotracheal tube remains in good position. Electronically Signed   By: Franchot Gallo M.D.   On: 03/11/2016 17:23   Dg Abd Portable 1 View  Result Date: 03/11/2016 CLINICAL DATA:  Orogastric tube placement EXAM: PORTABLE ABDOMEN - 1 VIEW COMPARISON:  March 02, 2015 FINDINGS: Orogastric tube tip and side port in stomach. Visualized bowel gas pattern unremarkable. There are surgical  clips in the right upper quadrant region. No free air evident. IMPRESSION: Orogastric tube tip and side port in stomach. Visualized bowel gas pattern unremarkable. Electronically Signed   By: Lowella Grip III M.D.   On: 03/11/2016 11:01    Procedures Procedures (including critical care time) INTUBATION Performed by: Threasa Beards  Required items: required blood products, implants, devices, and special equipment available Patient identity confirmed: provided demographic data and hospital-assigned identification number Time out: Immediately prior to procedure a "time out" was called to verify the correct patient, procedure, equipment, support staff and site/side marked as required.  Indications: respiratory arrest/CPR  Intubation method: Glidescope Laryngoscopy   Preoxygenation: BVM  Sedatives: Etomidate Paralytic: rocuronium  Tube Size: 7.5 cuffed  Post-procedure assessment: chest rise and ETCO2 monitor Breath sounds: equal and absent over the epigastrium Tube secured with: ETT holder Chest x-ray interpreted by radiologist and me.  Chest x-ray findings:endotracheal tube in appropriate position  Patient tolerated the procedure well with no immediate complications.  Cardiopulmonary Resuscitation (CPR) Procedure Note Directed/Performed by: Threasa Beards I personally directed ancillary staff and/or performed CPR in an effort to regain return of spontaneous circulation and to maintain cardiac, neuro and systemic perfusion.   CRITICAL CARE Performed by: Threasa Beards Total critical care time: 120 minutes Critical care time was exclusive of separately billable procedures and treating other patients. Critical care was necessary to treat or prevent imminent or life-threatening deterioration. Critical care was time spent personally by me on the following activities: development of treatment plan with patient and/or surrogate as well as nursing, discussions with consultants,  evaluation of patient's response to treatment, examination of patient, obtaining history from patient or surrogate, ordering and performing treatments and interventions, ordering and review of laboratory studies, ordering and review of radiographic studies, pulse oximetry and re-evaluation of patient's condition. Medications Ordered in ED Medications  EPINEPHrine (ADRENALIN) 1 MG/10ML injection (not administered)  norepinephrine (LEVOPHED) 4 mg in dextrose 5 % 250 mL (0.016 mg/mL) infusion (0 mcg/min Intravenous Stopped 03/11/16 1230)  0.9 %  sodium chloride infusion ( Intravenous Stopped 03/11/16 1814)  fentaNYL (SUBLIMAZE) injection 100 mcg (100 mcg Intravenous Given by Other 03/12/16 6950)  famotidine (PEPCID) 10 mg in sodium chloride 0.9 % 25 mL (10 mg Intravenous Given 03/12/16 1053)  heparin injection 1,000-6,000 Units (not administered)  sodium chloride 0.9 % primer fluid for CRRT (not administered)  heparin injection 5,000 Units (5,000 Units Subcutaneous Given 03/13/16 0535)  piperacillin-tazobactam (ZOSYN) IVPB 3.375 g (3.375 g Intravenous Given 03/13/16 0536)  vancomycin (VANCOCIN) IVPB 1000 mg/200 mL premix (1,000 mg Intravenous Given 03/12/16 1800)  0.9 %  sodium chloride infusion ( Intravenous Rate/Dose Verify 03/13/16 0500)  0.9 %  sodium chloride infusion ( Intravenous Stopped 03/11/16 2200)  sodium chloride flush (NS) 0.9 % injection 10-40 mL (10 mLs Intracatheter Given 03/13/16 1000)  sodium chloride flush (NS) 0.9 % injection 10-40 mL (not administered)  chlorhexidine gluconate (MEDLINE KIT) (PERIDEX) 0.12 % solution 15 mL (15 mLs Mouth Rinse Given 03/13/16 0800)  MEDLINE mouth rinse (15 mLs Mouth Rinse Given 03/13/16 1000)  dexmedetomidine (PRECEDEX) 400 MCG/100ML (4 mcg/mL) infusion (0 mcg/kg/hr  105.2 kg Intravenous Stopped 03/13/16 0800)  mupirocin ointment (BACTROBAN) 2 % 1 application (1 application Nasal Given 03/13/16 1028)  Chlorhexidine Gluconate Cloth 2 % PADS 6 each (6 each  Topical Given 03/13/16 0600)  fentaNYL 2560mg in NS 2583m(1025mml) infusion-PREMIX (0 mcg/hr Intravenous Stopped 03/13/16 0935)  fentaNYL (SUBLIMAZE) bolus via infusion 50 mcg (50 mcg Intravenous Bolus from Bag 03/13/16 0455)  dextrose 50 % solution 50 mL (50 mLs Intravenous Given 03/12/16 1158)  midazolam (VERSED) injection 2 mg (2 mg Intravenous Given 03/13/16 0526)  amLODipine (NORVASC) tablet 10 mg (10 mg Per Tube Given 03/13/16 1028)  gabapentin (NEURONTIN) capsule 300 mg (300 mg Oral Given 03/12/16 2230)  levothyroxine (SYNTHROID, LEVOTHROID) tablet 75 mcg (75 mcg Per Tube Given 03/13/16 0757)  carvedilol (COREG) tablet 12.5 mg (12.5 mg Per Tube Given 03/13/16 0757)  prismasol BGK 4/2.5 5,000 mL dialysis replacement fluid ( CRRT New Bag/Given 03/12/16 1143)  prismasol BGK 4/2.5 5,000 mL dialysis replacement fluid ( CRRT New Bag/Given 03/13/16 0534)  prismasol BGK 4/2.5 5,000 mL dialysis solution ( CRRT New Bag/Given 03/13/16 1039)  atorvastatin (LIPITOR) tablet 80 mg (80 mg Oral Given 03/12/16 1319)  isosorbide mononitrate (IMDUR) 24 hr tablet 30 mg (30 mg Oral Not Given 03/13/16 1018)  hydrALAZINE (APRESOLINE) injection 10 mg (10 mg Intravenous Given 03/13/16 0932)  hydrALAZINE (APRESOLINE) tablet 25 mg (not administered)  sodium bicarbonate injection (50 mEq Intravenous Given 03/11/16 1250)  calcium chloride injection (1 g Intravenous Given 03/11/16 0914)  dextrose 50 % solution ( Intravenous Canceled Entry 03/11/16 0930)  etomidate (AMIDATE) injection (20 mg Intravenous Given 03/11/16 0821)  rocuronium (ZEMURON) injection (100 mg Intravenous Given 03/11/16 0822)  0.9 %  sodium chloride infusion ( Intravenous Stopped 03/11/16 1230)  atropine injection (1 mg Intravenous Given 03/11/16 0918)  insulin aspart (novoLOG) injection 10 Units (10 Units Intravenous Given 03/11/16 0917)  dextrose 50 % solution 50 mL (50 mLs Intravenous Given 03/11/16 0917)  vancomycin (VANCOCIN) IVPB 1000 mg/200 mL premix (1,000 mg  Intravenous Transfusing/Transfer 03/11/16 1136)  piperacillin-tazobactam (ZOSYN) IVPB 4.5 g (0 g Intravenous Stopped 03/11/16 1135)  amiodarone (CORDARONE) 300 mg in dextrose 5 % 100 mL bolus (0 mg Intravenous Stopped 03/11/16 1230)  magnesium sulfate (IV Push/IM) injection (2 g Intravenous Given 03/11/16 0933)  fentaNYL (SUBLIMAZE) injection 100 mcg (100 mcg Intravenous Given 03/11/16 1932)  calcium gluconate 1 g in sodium chloride 0.9 % 100 mL IVPB (1 g Intravenous Given 03/11/16 1814)  insulin aspart (novoLOG) injection 10 Units (10 Units Intravenous Given 03/11/16 1256)  sodium polystyrene (KAYEXALATE) 15 GM/60ML suspension 30 g (30 g Oral Given 03/11/16 1347)  dextrose 50 % solution 25 mL (25 mLs Intravenous Given 03/12/16 0445)  dextrose 50 % solution (  Duplicate 9/62/22 9798)  fentaNYL (SUBLIMAZE) injection 50 mcg (50 mcg Intravenous Given 03/12/16 0730)  dextrose 50 % solution 25 mL (25 mLs Intravenous Given 03/12/16 0809)  midazolam (VERSED) injection 2 mg (2 mg Intravenous Given 03/12/16 2123)  aspirin chewable tablet 81 mg (81 mg Oral Given 03/12/16 1319)  dextrose 50 % solution (  Duplicate 11/14/17 4174)  dextrose 50 % solution 25 mL (25 mLs Intravenous Given 03/13/16 0810)     Initial Impression / Assessment and Plan / ED Course  I have reviewed the triage vital signs and the nursing notes.  Pertinent labs & imaging results that were available during my care of the patient were reviewed by me and considered in my medical decision making (see chart for details).   9:00AM- pulses regained, pt on levo drip.  D/w Dr. Ashok Cordia, PCCM- he states he will not see the patient until all data returned 9:45 AM discussed again with Dr. Ashok Cordia at approx 9:30am- pt coded second time- was able to regain pulses.  Dr. Ashok Cordia states to start cool packs, and they will see the patient.  9:57 AM continuing to await ICU.  Pt is maintaining pulse, O2 sats on ventilator.  On epi, levophed drips.  Bicarb drip.  Cool  packs have been placed.  EKG repeated and shows global ischemia- not a cath candidate at this time- d/w cardiology and they will see patient in the ED as well.  10:11 AM PCCM midlevel has now seen patient.  He states now [place] sun candidate- but will review with Dr. Ashok Cordia.      10:12 AM cardiology Dr. Meda Coffee is in the ED seeing the patient  10:22 AM on repeat labs potassium is increasing despite multiple rounds on calcium/bicarb, insulin and glucose- pt is also on a bicarb drip at this time.  Paging renal for consultation now. Continuing to await PCCM recs/orders.    10:34 AM critical care has now written admission orders- orders including plans for cooling.    10:36 AM d/w Narda Amber  kidney- they will consult on patient   Final Clinical Impressions(s) / ED Diagnoses   Final diagnoses:  Cardiac arrest ([place])  Respiratory arrest ([place])  Hyperkalemia  End stage renal disease on dialysis [facility])    New Prescriptions Current Discharge Medication List       Alfonzo Beers, MD 03/13/16 1155

## 2016-03-11 NOTE — ED Notes (Signed)
trish with cardiology at bedside 

## 2016-03-11 NOTE — Progress Notes (Signed)
Responded to referral from Judeth HornChaplain Banks to continue support to family. Patient reported to ED as CPR in progress. Patient was intubated and taken for CT scan. Family were escorted to consultation room B and was  updated by EDP on patient status. Presence are patient's husband, daughter, son-in law, and brother. All were  escorted to bedside to visit with patient and later to unit.. Patient was admitted to 4N24.   Provided empathetic listening, ministry of presence, emotional and spiritual support to family. Supported Haematologiststaff and facilatated information sharing between staff and family. Will pass on to unit Chaplain for continued support as needed.

## 2016-03-11 NOTE — Code Documentation (Signed)
Levophed started at 6

## 2016-03-11 NOTE — Code Documentation (Signed)
Per ems- pt is from home. Pt was reported to have started vomitting at 5am.

## 2016-03-11 NOTE — Code Documentation (Signed)
Pt going into intermittent vtach

## 2016-03-11 NOTE — H&P (Signed)
PULMONARY / CRITICAL CARE MEDICINE   Name: Valerie Perez [place] MRN: [id number] DOB: 03/27/1960    ADMISSION DATE:  03/11/2016 CONSULTATION DATE:  1/16  REFERRING MD:  Dr. 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 She has been on dialysis for 16 months and just started using her fistula. Most recent dialysis therapy was Saturday 1/13. He was a full session with no complications. Over the week and she did complain of some left-sided frontal head pain which she attributed to sinuses. She did take 1 sinus medication. The morning of 1/16 she woke up feeling very weak and then developed nausea and vomiting. Weakness progressed to the point she was unable to walk and her husband called EMS. Upon EMS arrival her pulse was found to be in the 40s and in route to the emergency department she suffered what was initially a V. fib cardiac arrest and subsequently PEA. This lasted for about 15 minutes prior to ROSC, and then again coded in the emergency department for approximately 30 minutes primarily PEA with 1 round of V. Fib. She remained in shock requiring high-dose Levophed and epinephrine infusions. Laboratory evaluation significant for potassium greater than 7.5. PCCM asked to admit the patient.  PAST MEDICAL HISTORY :  She  has a past medical history of Anemia; Arthritis; Depression; Diabetes mellitus without complication (HCC); Dyspnea; Fatty liver; Heart murmur; High cholesterol; Hypertension; Hypothyroid; Neuropathy (HCC); Pneumonia; PONV (postoperative nausea and vomiting); Renal disorder; and Sleep apnea.  PAST SURGICAL HISTORY: She  has a past surgical history that includes Kidney transplant (2000); Abdominal hysterectomy; Cholecystectomy (2000); Colonoscopy; AV fistula placement (Right,  10/03/2015); Debridement of abdominal wall abscess (01/14/2016); and Fistula superficialization (Right, 01/30/2016).  Allergies  Allergen Reactions  . Tape Other (See Comments)    Tears skin  . 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    No current facility-administered medications on file prior to encounter.    Current Outpatient Prescriptions on File Prior to Encounter  Medication Sig  . acetaminophen (TYLENOL) 500 MG tablet Take 500 mg by mouth every 6 (six) hours as needed for moderate pain.  Marland Kitchen. allopurinol (ZYLOPRIM) 300 MG tablet Take 300 mg by mouth daily.  Marland Kitchen. amLODipine (NORVASC) 5 MG tablet Take 10 mg by mouth daily.  Marland Kitchen. buPROPion (WELLBUTRIN XL) 150 MG 24 hr tablet Take 150 mg by mouth at bedtime.  . carvedilol (COREG) 12.5 MG tablet Take 12.5 mg by mouth 2 (two) times daily with a meal.  . DULoxetine (CYMBALTA) 60 MG capsule Take 60 mg by mouth at bedtime.  . fluticasone (FLONASE) 50 MCG/ACT nasal spray Place 1 spray into both nostrils every morning.  . gabapentin (NEURONTIN) 100 MG capsule Take 300 mg by mouth at bedtime.  . insulin glargine (LANTUS) 100 UNIT/ML injection Inject 15 Units into the skin 2 (two) times daily.   . insulin lispro (HUMALOG) 100 UNIT/ML injection Inject 12 Units into the skin 3 (three) times daily before meals. Sliding scale: 12 units with each meal. Add 1 unit every 25 point BG > 150. Ex: BG 175, add 1 unit = 13 unit; BG 200, add 2 unit = 14 unit.  Marland Kitchen. levothyroxine (SYNTHROID, LEVOTHROID) 75 MCG tablet Take 1 tablet (75 mcg total) by mouth daily before breakfast.  .  loratadine (CLARITIN) 10 MG tablet Take 10 mg by mouth every morning.  . montelukast (SINGULAIR) 10 MG tablet Take 10 mg by mouth at bedtime.  Marland Kitchen oxyCODONE-acetaminophen (PERCOCET/ROXICET) 5-325 MG tablet Take 1 tablet by mouth every 6 (six) hours as needed.  . polyethylene glycol (MIRALAX / GLYCOLAX) packet Take 17 g by mouth daily.  . pravastatin (PRAVACHOL) 40 MG  tablet Take 40 mg by mouth at bedtime.  . predniSONE (DELTASONE) 5 MG tablet Take 5 mg by mouth daily with breakfast.  . SENSIPAR 30 MG tablet Take 1 tablet by mouth daily.  . sevelamer carbonate (RENVELA) 800 MG tablet Take 1,600 mg by mouth 3 (three) times daily with meals.     FAMILY HISTORY:  Her indicated that her mother is deceased. She indicated that her father is deceased.    SOCIAL HISTORY: She  reports that she has never smoked. She has never used smokeless tobacco. She reports that she does not drink alcohol or use drugs.  REVIEW OF SYSTEMS:   Unable due to acute encephalopathy  SUBJECTIVE:    VITAL SIGNS: BP 172/84   Pulse 63   Temp 97.5 F (36.4 Perez) (Oral)   Resp 23   SpO2 90%   HEMODYNAMICS:    VENTILATOR SETTINGS: Vent Mode: PRVC FiO2 (%):  [100 %] 100 % Set Rate:  [18 bmp] 18 bmp Vt Set:  [500 mL] 500 mL PEEP:  [5 cmH20] 5 cmH20 Plateau Pressure:  [25 cmH20] 25 cmH20  INTAKE / OUTPUT: No intake/output data recorded.  PHYSICAL EXAMINATION: General:  Morbidly obese female in no acute distress on the ventilator Neuro:  Comatose without sedation, pupils 6 mm and fixed, no response to pain. GCS 3T HEENT:  Normocephalic atraumatic no appreciable JVD Cardiovascular:  Regular rate and rhythm, S1, S2, no MRG Lungs:  Clear bilateral breath sounds Abdomen:  Soft, nondistended Musculoskeletal:  No acute deformity  Skin:  Grossly intact with the exception of abdominal wound.  LABS:  BMET  Recent Labs Lab 03/11/16 0850 03/11/16 0900 03/11/16 1012  NA 139 135 132*  K >7.5* 7.4* 8.4*  CL 100* 102 102  CO2 19*  --   --   BUN 103* 104* 108*  CREATININE 9.43* 9.90* 9.90*  GLUCOSE 248* 240* 312*    Electrolytes  Recent Labs Lab 03/11/16 0850  CALCIUM 10.1    CBC  Recent Labs Lab 03/11/16 0900 03/11/16 1012  HGB 10.2* 10.9*  HCT 30.0* 32.0*    Coag's No results for input(s): APTT, INR in the last 168 hours.  Sepsis Markers  Recent  Labs Lab 03/11/16 0900  LATICACIDVEN 7.49*    ABG  Recent Labs Lab 03/11/16 1009  PHART 7.223*  PCO2ART 51.0*  PO2ART 84.0    Liver Enzymes No results for input(s): AST, ALT, ALKPHOS, BILITOT, ALBUMIN in the last 168 hours.  Cardiac Enzymes No results for input(s): TROPONINI, PROBNP in the last 168 hours.  Glucose No results for input(s): GLUCAP in the last 168 hours.  Imaging Dg Chest Portable 1 View  Result Date: 03/11/2016 CLINICAL DATA:  Endotracheal tube placement.  Vomiting at home. EXAM: PORTABLE CHEST 1 VIEW COMPARISON:  04/28/2015 FINDINGS: Endotracheal tube tip below the clavicular heads. Dialysis catheter on the right with tip at the upper right atrium. Fairly symmetric widespread advanced airspace disease. No visible pleural effusion or pneumothorax. Cardiopericardial enlargement. Upper mediastinal width is distorted by leftward rotation. There could also be vascular pedicle thickening in the setting of CHF. IMPRESSION: 1.  Endotracheal tube in good position. 2. Advanced airspace disease, bilaterality favoring pulmonary edema. Aspiration/pneumonia is also considered given history of vomiting. Electronically Signed   By: Marnee Spring M.D.   On: 03/11/2016 09:05     STUDIES:  CT head 1/16 >  CULTURES: BCx2 1/16 > Urine 1/16 >  ANTIBIOTICS:   SIGNIFICANT EVENTS: 1/16 Cardiac arrest  LINES/TUBES: Right IJ tunneled dialysis catheter 1/16 ETT >  DISCUSSION: 56 year old female with end-stage renal disease on dialysis suffered VF/PEA cardiac arrest for in total just over 45 minutes. Potassium found to be greater than 7.5. Admitted to the pulmonary critical care team with plans for normothermia protocol due to prolonged downtime, fluctuant abnormalities, refractory shock.  ASSESSMENT / PLAN:  PULMONARY A: Acute hypercarbic respiratory failure Sleep apnea  P:   Full vent support Follow chest x-ray Follow ABG VAP prevention  bundle  CARDIOVASCULAR A:  Cardiogenic shock secondary to cardiac arrest History of hypertension, hyperlipidemia  P:  Telemetry monitoring MAP goal greater than 65 mmHg Weaning epinephrine to off Continue Levophed for map goal Amiodarone infusion Cardiology has evaluated, not candidate for cardiac cath at this time Echocardiogram Ensure lactic clearing  RENAL A:   End-stage renal disease on dialysis (Tuesday, Thursday, Saturday) Severe hyperkalemia 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  P:   Nephrology, Dr. Hyman Hopes, consulted and will evaluate the patient for dialysis versus CRRT Trend BMP every 4 hours Management per nephrology  GASTROINTESTINAL A:   No acute issues  P:   Nothing by mouth Renal dosed Pepcid for stress ulcer prophylaxis Start tube feeds 1/17  HEMATOLOGIC A:   Anemia of chronic illness  P:  Follow CBC Hold on DVT prophylaxis until head CT results.  INFECTIOUS A:   Leukocytosis - suspect stress response  P:   Culture blood and urine Defer antibiotics for now Follow WBC and fever curve  ENDOCRINE A:   Diabetes mellitus  P:   CBG monitoring and SSI moderate  NEUROLOGIC A:   Acute anoxic encephalopathy  P:   RASS goal: 0 CT head pending EEG pending  FAMILY  - Updates:  Husband and daughter updated in the emergency department. Endorse full code  - Inter-disciplinary family meet or Palliative Care meeting due by:  1/22   Joneen Roach, AGACNP-BC [place] [facility] Pager [phone] or [phone]  03/11/2016 11:07 AM

## 2016-03-11 NOTE — Code Documentation (Signed)
Dr linker attempted intubation x2. anaesthesia called for assistance.

## 2016-03-11 NOTE — Progress Notes (Signed)
   03/11/16 0800  Clinical Encounter Type  Visited With Family;Health care provider  Visit Type Initial;Spiritual support  Referral From Chaplain;Nurse  Consult/Referral To Chaplain  Recommendations (continued chaplain presence)  Spiritual Encounters  Spiritual Needs Emotional  Stress Factors  Family Stress Factors Health changes;Lack of knowledge    Chaplain provided emotional and spiritual support for husband of pt. Husband does not yet know that cpr has been initiated.

## 2016-03-11 NOTE — Progress Notes (Signed)
   03/11/16 0900  Clinical Encounter Type  Visited With Family;Health care provider  Visit Type Follow-up;Spiritual support  Consult/Referral To Chaplain  Spiritual Encounters  Spiritual Needs Emotional  Stress Factors  Family Stress Factors Health changes   Pt dr updated family. Pt in critical condition, provided husband emotional support and ministry of presence.

## 2016-03-11 NOTE — ED Notes (Signed)
Cardiology at bedside.

## 2016-03-11 NOTE — ED Notes (Signed)
Levophed at 40 mch

## 2016-03-11 NOTE — ED Notes (Signed)
Levophed at 

## 2016-03-11 NOTE — ED Notes (Signed)
ED Provider at bedside.arterial line in place on rt side. Pt has old fistulka in place per family. BP cuff and arterial line not correlating. Levophed decreased per EDP

## 2016-03-11 NOTE — Procedures (Signed)
Arterial Catheter Insertion Procedure Note Renella CunasKatherine C Mulvey [id number] Feb 08, 1961  Procedure: Insertion of Arterial Catheter  Indications: Blood pressure monitoring  Procedure Details Consent: Unable to obtain consent because of emergent medical necessity. Time Out: Verified patient identification, verified procedure, site/side was marked, verified correct patient position, special equipment/implants available, medications/allergies/relevent history reviewed, required imaging and test results available.  Performed  Maximum sterile technique was used including antiseptics, cap, gloves, gown, hand hygiene, mask and sheet. Skin prep: Chlorhexidine; local anesthetic administered 20 gauge catheter was inserted into left radial artery using the Seldinger technique.  Evaluation Blood flow good; BP tracing good. Complications: No apparent complications.   Morley KosJohnson, Moiz Ryant Leroy 03/11/2016

## 2016-03-11 NOTE — Code Documentation (Signed)
Levophed at 10mcg

## 2016-03-11 NOTE — Progress Notes (Signed)
Elink notified of elevated blood pressure and pt awake, following simple commands for family.  Will continue to monitor pt closely.

## 2016-03-11 NOTE — Progress Notes (Signed)
Pharmacy Antibiotic Note  Valerie Perez is a 56 y.o. female admitted on 03/11/2016 with pneumonia.  Pharmacy has been consulted for vancomycin/zosyn dosing. She was given vanc 1 gm at 1111 and zosyn 4.5 gm at 1035 am. WBC 16.9, ESRD pt now on CRRT.  PCT 0.13, lactate 1.65, hypothermic. Wt 105 kg. Questionable B PNA vs pulm edema vs alveolar hemmorhage  Plan: Vancomycin 1000 mg IV every 24 hours.  Goal trough 15-20 mcg/mL. Start now 2nd to pt needs a 2 gm loading dose Zosyn 3.375 gm IV q6h - infuse each dose over 30 minutes (CRRT dose) F/u CRRT, temp, culture data, vancomycin levels as needed    Temp (24hrs), Avg:95.5 F (35.3 C), Min:93.7 F (34.3 C), Max:97.5 F (36.4 C)   Recent Labs Lab 03/11/16 0850 03/11/16 0900 03/11/16 1012 03/11/16 1045 03/11/16 1146  WBC 16.9*  --   --   --   --   CREATININE 9.43* 9.90* 9.90* 9.61*  --   LATICACIDVEN  --  7.49*  --   --  1.64    Estimated Creatinine Clearance: 7.8 mL/min (by C-G formula based on SCr of 9.61 mg/dL (H)).    Allergies  Allergen Reactions  . Tape Other (See Comments)    Tears skin  . 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    Antimicrobials this admission: vanc 1/16>> Zosyn 1/166>> Dose adjustments this admission:   Microbiology results: 1/16 resp panel>> 1/16 BCx2>> 1/16 MRSA PCR >>  Herby AbrahamMichelle T. Jeraldean Wechter, Pharm.D. [phone] 03/11/2016 4:22 PM

## 2016-03-11 NOTE — Progress Notes (Signed)
Discontinue droplet precautions per Joneen RoachPaul Hoffman.

## 2016-03-11 NOTE — Consult Note (Signed)
CARDIOLOGY CONSULT NOTE   Patient ID: Valerie Perez MRN: [id number], DOB/AGE: October 22, 1960   Admit date: 03/11/2016 Date of Consult: 03/11/2016  Primary Physician: Pcp Not In System Primary Cardiologist: new patient   Reason for consult:  Cardiac arrest  Problem List  Past Medical History:  Diagnosis Date  . Anemia   . Arthritis    osteoarthritis  . Depression   . Diabetes mellitus without complication ([place])    Type II  . Dyspnea    with exertion  . Fatty liver   . Heart murmur    just recently told she had a heart murmur, but had never heard this before  . High cholesterol   . Hypertension   . Hypothyroid   . Neuropathy ([place])   . Pneumonia   . PONV (postoperative nausea and vomiting)   . Renal disorder    dialysis on Tuesday/Thursday/Saturday  . Sleep apnea    uses cpap    Past Surgical History:  Procedure Laterality Date  . ABDOMINAL HYSTERECTOMY    . AV FISTULA PLACEMENT Right 10/03/2015   Procedure: CREATION-RIGHT BRACHIOCEPHALIC ARTERIOVENOUS (AV) FISTULA;  Surgeon: Conrad Oriskany, MD;  Location: [place];  Service: Vascular;  Laterality: Right;  . CHOLECYSTECTOMY  2000  . COLONOSCOPY    . DEBRIDEMENT OF ABDOMINAL WALL ABSCESS  01/14/2016  . FISTULA SUPERFICIALIZATION Right 01/30/2016   Procedure: FISTULA SUPERFICIALIZATION RIGHT UPPER ARM;  Surgeon: Conrad Inkerman, MD;  Location: [place];  Service: Vascular;  Laterality: Right;  . KIDNEY TRANSPLANT  2000     Allergies  Allergies  Allergen Reactions  . Tape Other (See Comments)    Tears skin  . 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    HPI   56 year old female with PMH of  past medical history of ESRD on HD, s/p renal transplant in 2000, failed in 2017, chronic anemia; depression; DM, fatty liver, hypertension, hyperlipidemia, hypothyroid, OSA who was brought to the [facility] ER after cardia arrest. She started to feel nauseous,weak, diaphoretic, EMS was called found her in  bradycardia, in route to the emergency department she suffered what was initially VF cardiac arrest and subsequently PEA. This lasted for about 15 minutes prior to [place], and then again coded in the emergency department for approximately 30 minutes primarily PEA with 1 round of V. Fib. She remained in shock requiring high-dose Levophed and epinephrine infusions. Laboratory evaluation significant for potassium greater than 7.5. Overall CPR > 1 hour.    Inpatient Medications  . chlorhexidine gluconate (MEDLINE KIT)  15 mL Mouth Rinse BID  . EPINEPHrine      . famotidine (PEPCID) IV  10 mg Intravenous Q24H  . heparin subcutaneous  5,000 Units Subcutaneous Q8H  . mouth rinse  15 mL Mouth Rinse QID   . sodium chloride    . amiodarone    . amiodarone    . epinephrine Stopped (03/11/16 1043)  . norepinephrine (LEVOPHED) Adult infusion 20 mcg/min (03/11/16 1141)  . dialysis replacement fluid (prismasate)    . dialysis replacement fluid (prismasate)    . dialysate (PRISMASATE)    .  sodium bicarbonate  infusion 1000 mL 100 mL/hr at 03/11/16 0935   Family History Family History  Problem Relation Age of Onset  . Colon cancer Mother   . Thyroid cancer Father      Social History Social History   Social History  . Marital status: Married    Spouse name: N/A  .  Number of children: N/A  . Years of education: N/A   Occupational History  . Not on file.   Social History Main Topics  . Smoking status: Never Smoker  . Smokeless tobacco: Never Used  . Alcohol use No  . Drug use: No  . Sexual activity: Not on file   Other Topics Concern  . Not on file   Social History Narrative  . No narrative on file     Review of Systems  General:  No chills, fever, night sweats or weight changes.  Cardiovascular:  No chest pain, dyspnea on exertion, edema, orthopnea, palpitations, paroxysmal nocturnal dyspnea. Dermatological: No rash, lesions/masses Respiratory: No cough, dyspnea Urologic: No  hematuria, dysuria Abdominal:   No nausea, vomiting, diarrhea, bright red blood per rectum, melena, or hematemesis Neurologic:  No visual changes, wkns, changes in mental status. All other systems reviewed and are otherwise negative except as noted above.  Physical Exam  Blood pressure (!) 202/77, pulse 61, temperature 97.5 F (36.4 C), temperature source Oral, resp. rate 26, SpO2 95 %.  General: intubated, non-responsive, 7 mm pupils B/L Lungs:  Resp regular and unlabored, crackles B/L. Heart: RRR no s3, s4, or murmurs. Abdomen: ditended abdomen with no bowel sounds Extremities: No clubbing, cyanosis or edema. DP/PT/Radials 2+ and equal bilaterally.  Labs   Recent Labs  03/11/16 1045  TROPONINI 0.21*   Lab Results  Component Value Date   WBC 16.9 (H) 03/11/2016   HGB 10.9 (L) 03/11/2016   HCT 32.0 (L) 03/11/2016   MCV 91.5 03/11/2016   PLT 263 03/11/2016    Recent Labs Lab 03/11/16 1045  NA 136  K >7.5*  CL 99*  CO2 19*  BUN 106*  CREATININE 9.61*  CALCIUM 9.4  GLUCOSE 293*   Radiology/Studies  Dg Chest Portable 1 View  Result Date: 03/11/2016 CLINICAL DATA:  Endotracheal tube placement.  Vomiting at home. EXAM: PORTABLE CHEST 1 VIEW COMPARISON:  04/28/2015 FINDINGS: Endotracheal tube tip below the clavicular heads. Dialysis catheter on the right with tip at the upper right atrium. Fairly symmetric widespread advanced airspace disease. No visible pleural effusion or pneumothorax. Cardiopericardial enlargement. Upper mediastinal width is distorted by leftward rotation. There could also be vascular pedicle thickening in the setting of CHF. IMPRESSION: 1. Endotracheal tube in good position. 2. Advanced airspace disease, bilaterality favoring pulmonary edema. Aspiration/pneumonia is also considered given history of vomiting. Electronically Signed   By: Monte Fantasia M.D.   On: 03/11/2016 09:05   Dg Abd Portable 1 View  Result Date: 03/11/2016 CLINICAL DATA:   Orogastric tube placement EXAM: PORTABLE ABDOMEN - 1 VIEW COMPARISON:  March 02, 2015 FINDINGS: Orogastric tube tip and side port in stomach. Visualized bowel gas pattern unremarkable. There are surgical clips in the right upper quadrant region. No free air evident. IMPRESSION: Orogastric tube tip and side port in stomach. Visualized bowel gas pattern unremarkable. Electronically Signed   By: Lowella Grip III M.D.   On: 03/11/2016 11:01   Echocardiogram - 04/30/15 - Left ventricle: The cavity size was normal. Wall thickness was   increased in a pattern of moderate LVH. Systolic function was   normal. The estimated ejection fraction was in the range of 55%   to 60%. - Aortic valve: There was trivial regurgitation. - Mitral valve: There was mild regurgitation.  ECG: SR, nonspecific IVCD     ASSESSMENT AND PLAN  1. 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 PPCM doesn't want  to cool down given electrolye abnormalities. We will follow and cth if she recovers neurologically. Given polymorbidity with multiorgan failure, her prognosis is very poor.    Signed, Ena Dawley, MD, [facility] 03/11/2016, 12:16 PM

## 2016-03-11 NOTE — Procedures (Signed)
Central Venous Catheter Insertion Procedure Note Valerie Perez [id number] 1960/11/16  Procedure: Insertion of Central Venous Catheter Indications: Drug and/or fluid administration  Procedure Details Consent: Risks of procedure as well as the alternatives and risks of each were explained to the (patient/caregiver).  Consent for procedure obtained. Time Out: Verified patient identification, verified procedure, site/side was marked, verified correct patient position, special equipment/implants available, medications/allergies/relevent history reviewed, required imaging and test results available.  Performed  Maximum sterile technique was used including antiseptics, cap, gloves, gown, hand hygiene, mask and sheet. Skin prep: Chlorhexidine; local anesthetic administered A antimicrobial bonded/coated triple lumen catheter was placed in the left internal jugular vein using the Seldinger technique. Ultrasound guidance used.Yes.   Catheter placed to 20 cm. Blood aspirated via all 3 ports and then flushed x 3. Line sutured x 2 and dressing applied.  Evaluation Blood flow good Complications: No apparent complications Patient did tolerate procedure well. Chest X-ray ordered to verify placement.  CXR: pending.  Joneen RoachPaul Hoffman, AGACNP-BC [facility] Pager [phone] or [phone]  03/11/2016 4:28 PM

## 2016-03-11 NOTE — Consult Note (Signed)
[facility] Renal Consultation Note    Indication for Consultation:  Management of ESRD/hemodialysis, anemia, hypertension/volume, and secondary hyperparathyroidism. PCP:  HPI: Valerie Perez is a 56 y.o. 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 Pt is intubated/sedated, Hx from notes. EMS called this morning after she woke up feeling very weak (unable to walk) and subsequently developed nausea and vomiting.  Found to be bradycardic (40's) and then suffered V.fib arrest. ROSC after 17mnutes of CPR and then coded again in ED (PEA v. V.fib) being worked on x 30 minutes prior to ROSC. She was found to have K 7.5, repeated 8.4. Currently on pressors for cardiogenic shock.  From a renal standpoint, [facility] which she did complete in entirety. She has a fistula which has recently started being used, and low BFR with small gauge needles could contribute to less K removal with HD. Looks like her last listed K was 6.0 on 02/14/16. She was started on losartan for HTN on 03/01/16.   Past Medical History:  Diagnosis Date  . Anemia   . Arthritis    osteoarthritis  . Depression   . Diabetes mellitus without complication ([place]    Type II  . Dyspnea    with exertion  . Fatty liver   . Heart murmur    just recently told she had a heart murmur, but had never heard this before  . High cholesterol   . Hypertension   . Hypothyroid   . Neuropathy ([place]   . Pneumonia   . PONV (postoperative nausea and vomiting)   . Renal disorder    dialysis on Tuesday/Thursday/Saturday  . Sleep apnea    uses cpap   Past Surgical History:  Procedure Laterality Date  . ABDOMINAL HYSTERECTOMY    . AV FISTULA PLACEMENT Right 10/03/2015   Procedure: CREATION-RIGHT BRACHIOCEPHALIC ARTERIOVENOUS (AV) FISTULA;  Surgeon: BConrad [place] MD;  [place]  Service: Vascular;  Laterality: Right;  . CHOLECYSTECTOMY  2000  . COLONOSCOPY    . DEBRIDEMENT OF ABDOMINAL WALL ABSCESS  01/14/2016  . FISTULA SUPERFICIALIZATION Right 01/30/2016   Procedure: FISTULA SUPERFICIALIZATION RIGHT UPPER ARM;  Surgeon: BConrad [place] MD;  [place]  Service: Vascular;  Laterality: Right;  . KIDNEY TRANSPLANT  2000   Family History  Problem Relation Age of Onset  . Colon cancer Mother   . Thyroid cancer Father    Social History:  reports that she has never smoked. She has never used smokeless tobacco. She reports that she does not drink alcohol or use drugs.  ROS: Unable to obtain. Pt intubated/sedated.  Physical Exam: Vitals:   03/11/16 1010 03/11/16 1030 03/11/16 1100 03/11/16 1115  BP:  199/80 200/67 (!) 202/77  Pulse:  72 77 61  Resp:  26 26 26   Temp: 97.5 F (36.4 C)     TempSrc: Oral     SpO2:  95% 91% 95%     General: Obese female, intubated/ventilated, sedated. Head: Normocephalic, atraumatic. Lungs: Clear anteriorly, on ventilator. Heart: RRR; no murmur currently. Abdomen: Soft. Lower extremities: No LE edema. Neuro: Sedated, non-responsive. Dialysis Access: RUE AVF, TDC in R chest  Allergies  Allergen Reactions  . Tape Other (See Comments)    Tears skin  . Aspirin Other (See Comments)    KIDNEY transplant-Pt  takes Baby ASA   Prior to Admission medications   Medication Sig Start Date End Date Taking? Authorizing Provider  acetaminophen (TYLENOL) 500 MG tablet Take 500 mg by mouth every 6 (six) hours as needed for moderate pain.    Historical Provider, MD  allopurinol (ZYLOPRIM) 300 MG tablet Take 300 mg by mouth daily.    Historical Provider, MD  amLODipine (NORVASC) 5 MG tablet Take 10 mg by mouth daily.    Historical Provider, MD  buPROPion (WELLBUTRIN XL) 150 MG 24 hr tablet Take 150 mg by mouth at bedtime.    Historical Provider, MD  carvedilol (COREG) 12.5 MG tablet Take 12.5 mg by mouth 2  (two) times daily with a meal.    Historical Provider, MD  DULoxetine (CYMBALTA) 60 MG capsule Take 60 mg by mouth at bedtime.    Historical Provider, MD  fluticasone (FLONASE) 50 MCG/ACT nasal spray Place 1 spray into both nostrils every morning.    Historical Provider, MD  gabapentin (NEURONTIN) 100 MG capsule Take 300 mg by mouth at bedtime.    Historical Provider, MD  insulin glargine (LANTUS) 100 UNIT/ML injection Inject 15 Units into the skin 2 (two) times daily.     Historical Provider, MD  insulin lispro (HUMALOG) 100 UNIT/ML injection Inject 12 Units into the skin 3 (three) times daily before meals. Sliding scale: 12 units with each meal. Add 1 unit every 25 point BG > 150. Ex: BG 175, add 1 unit = 13 unit; BG 200, add 2 unit = 14 unit.    Historical Provider, MD  levothyroxine (SYNTHROID, LEVOTHROID) 75 MCG tablet Take 1 tablet (75 mcg total) by mouth daily before breakfast. 04/30/15   Iline Oven, MD  loratadine (CLARITIN) 10 MG tablet Take 10 mg by mouth every morning.    Historical Provider, MD  montelukast (SINGULAIR) 10 MG tablet Take 10 mg by mouth at bedtime.    Historical Provider, MD  oxyCODONE-acetaminophen (PERCOCET/ROXICET) 5-325 MG tablet Take 1 tablet by mouth every 6 (six) hours as needed. 01/30/16   Ulyses Amor, PA-C  polyethylene glycol (MIRALAX / GLYCOLAX) packet Take 17 g by mouth daily.    Historical Provider, MD  pravastatin (PRAVACHOL) 40 MG tablet Take 40 mg by mouth at bedtime.    Historical Provider, MD  predniSONE (DELTASONE) 5 MG tablet Take 5 mg by mouth daily with breakfast.    Historical Provider, MD  SENSIPAR 30 MG tablet Take 1 tablet by mouth daily. 07/30/15   Historical Provider, MD  sevelamer carbonate (RENVELA) 800 MG tablet Take 1,600 mg by mouth 3 (three) times daily with meals.     Historical Provider, MD   Current Facility-Administered Medications  Medication Dose Route Frequency Provider Last Rate Last Dose  . 0.9 %  sodium chloride infusion    Intravenous Continuous Corey Harold, NP      . amiodarone (NEXTERONE PREMIX) 360-4.14 MG/200ML-% (1.8 mg/mL) IV infusion  60 mg/hr Intravenous Continuous Corey Harold, NP      . amiodarone (NEXTERONE PREMIX) 360-4.14 MG/200ML-% (1.8 mg/mL) IV infusion  30 mg/hr Intravenous Continuous Corey Harold, NP      . chlorhexidine gluconate (MEDLINE KIT) (PERIDEX) 0.12 % solution 15 mL  15 mL Mouth Rinse BID Corey Harold, NP      . EPINEPHrine (ADRENALIN) 1 MG/10ML injection           . EPINEPHrine (ADRENALIN) 4 mg in dextrose 5 % 250 mL (0.016 mg/mL) infusion  0.5-20  mcg/min Intravenous Titrated Alfonzo Beers, MD   Stopped at 03/11/16 1043  . famotidine (PEPCID) 10 mg in sodium chloride 0.9 % 25 mL  10 mg Intravenous Q24H Corey Harold, NP      . fentaNYL (SUBLIMAZE) injection 100 mcg  100 mcg Intravenous Q15 min PRN Corey Harold, NP      . fentaNYL (SUBLIMAZE) injection 100 mcg  100 mcg Intravenous Q2H PRN Corey Harold, NP      . heparin injection 1,000-6,000 Units  1,000-6,000 Units CRRT PRN Edrick Oh, MD      . MEDLINE mouth rinse  15 mL Mouth Rinse QID Corey Harold, NP      . norepinephrine (LEVOPHED) 4 mg in dextrose 5 % 250 mL (0.016 mg/mL) infusion  0-50 mcg/min Intravenous Titrated Corey Harold, NP 97.5 mL/hr at 03/11/16 1123 26 mcg/min at 03/11/16 1123  . prismasol BGK 0/2.5 5,000 mL dialysis replacement fluid   CRRT Continuous Edrick Oh, MD      . prismasol BGK 0/2.5 5,000 mL dialysis replacement fluid   CRRT Continuous Edrick Oh, MD      . prismasol BGK 0/2.5 5,000 mL dialysis solution   CRRT Continuous Edrick Oh, MD      . sodium bicarbonate 150 mEq in dextrose 5 % 1,000 mL infusion   Intravenous Continuous Alfonzo Beers, MD 100 mL/hr at 03/11/16 0935    . sodium chloride 0.9 % primer fluid for CRRT   CRRT PRN Edrick Oh, MD      . vancomycin (VANCOCIN) IVPB 1000 mg/200 mL premix  1,000 mg Intravenous Once Alfonzo Beers, MD 200 mL/hr at 03/11/16 1111 1,000 mg at 03/11/16  1111   Current Outpatient Prescriptions  Medication Sig Dispense Refill  . acetaminophen (TYLENOL) 500 MG tablet Take 500 mg by mouth every 6 (six) hours as needed for moderate pain.    Marland Kitchen allopurinol (ZYLOPRIM) 300 MG tablet Take 300 mg by mouth daily.    Marland Kitchen amLODipine (NORVASC) 5 MG tablet Take 10 mg by mouth daily.    Marland Kitchen buPROPion (WELLBUTRIN XL) 150 MG 24 hr tablet Take 150 mg by mouth at bedtime.    . carvedilol (COREG) 12.5 MG tablet Take 12.5 mg by mouth 2 (two) times daily with a meal.    . DULoxetine (CYMBALTA) 60 MG capsule Take 60 mg by mouth at bedtime.    . fluticasone (FLONASE) 50 MCG/ACT nasal spray Place 1 spray into both nostrils every morning.    . gabapentin (NEURONTIN) 100 MG capsule Take 300 mg by mouth at bedtime.    . insulin glargine (LANTUS) 100 UNIT/ML injection Inject 15 Units into the skin 2 (two) times daily.     . insulin lispro (HUMALOG) 100 UNIT/ML injection Inject 12 Units into the skin 3 (three) times daily before meals. Sliding scale: 12 units with each meal. Add 1 unit every 25 point BG > 150. Ex: BG 175, add 1 unit = 13 unit; BG 200, add 2 unit = 14 unit.    Marland Kitchen levothyroxine (SYNTHROID, LEVOTHROID) 75 MCG tablet Take 1 tablet (75 mcg total) by mouth daily before breakfast. 30 tablet 0  . loratadine (CLARITIN) 10 MG tablet Take 10 mg by mouth every morning.    . montelukast (SINGULAIR) 10 MG tablet Take 10 mg by mouth at bedtime.    Marland Kitchen oxyCODONE-acetaminophen (PERCOCET/ROXICET) 5-325 MG tablet Take 1 tablet by mouth every 6 (six) hours as needed. 15 tablet 0  . polyethylene glycol (MIRALAX /  GLYCOLAX) packet Take 17 g by mouth daily.    . pravastatin (PRAVACHOL) 40 MG tablet Take 40 mg by mouth at bedtime.    . predniSONE (DELTASONE) 5 MG tablet Take 5 mg by mouth daily with breakfast.    . SENSIPAR 30 MG tablet Take 1 tablet by mouth daily.    . sevelamer carbonate (RENVELA) 800 MG tablet Take 1,600 mg by mouth 3 (three) times daily with meals.       Labs: Basic Metabolic Panel:  Recent Labs Lab 03/11/16 0850 03/11/16 0900 03/11/16 1012  NA 139 135 132*  K >7.5* 7.4* 8.4*  CL 100* 102 102  CO2 19*  --   --   GLUCOSE 248* 240* 312*  BUN 103* 104* 108*  CREATININE 9.43* 9.90* 9.90*  CALCIUM 10.1  --   --    CBC:  Recent Labs Lab 03/11/16 0850 03/11/16 0900 03/11/16 1012  WBC 16.9*  --   --   HGB 9.9* 10.2* 10.9*  HCT 32.1* 30.0* 32.0*  MCV 91.5  --   --   PLT 263  --   --    Studies/Results: Dg Chest Portable 1 View  Result Date: 03/11/2016 CLINICAL DATA:  Endotracheal tube placement.  Vomiting at home. EXAM: PORTABLE CHEST 1 VIEW COMPARISON:  04/28/2015 FINDINGS: Endotracheal tube tip below the clavicular heads. Dialysis catheter on the right with tip at the upper right atrium. Fairly symmetric widespread advanced airspace disease. No visible pleural effusion or pneumothorax. Cardiopericardial enlargement. Upper mediastinal width is distorted by leftward rotation. There could also be vascular pedicle thickening in the setting of CHF. IMPRESSION: 1. Endotracheal tube in good position. 2. Advanced airspace disease, bilaterality favoring pulmonary edema. Aspiration/pneumonia is also considered given history of vomiting. Electronically Signed   By: Monte Fantasia M.D.   On: 03/11/2016 09:05   Dg Abd Portable 1 View  Result Date: 03/11/2016 CLINICAL DATA:  Orogastric tube placement EXAM: PORTABLE ABDOMEN - 1 VIEW COMPARISON:  March 02, 2015 FINDINGS: Orogastric tube tip and side port in stomach. Visualized bowel gas pattern unremarkable. There are surgical clips in the right upper quadrant region. No free air evident. IMPRESSION: Orogastric tube tip and side port in stomach. Visualized bowel gas pattern unremarkable. Electronically Signed   By: Lowella Grip III M.D.   On: 03/11/2016 11:01    Dialysis Orders:  TTS at [facility] 3:30 hours, BFR 250/DFR A1.5 (with new AVF/17g needles), [id number], 2K/2Ca  bath, AVF/TDC present - Heparin 3000 unit bolus - Mircera 2052mg IV q 2 weeks (last given 1/9) - Calcitriol 164m PO TIW  Assessment/Plan: 1.  Cardiogenic shock (s/p cardiac arrest x 2 (Vfib/PEA)): Cooling now. Initially required pressors, now being weaned. Dr. WeJustin Mendas put in orders and will start CRRT emergently for hyperK with 0K bath, will follow labs. Cardiology consulting.  2.  Hyperkalemia: K 8.4, already treated with Ca/D50/insulin, should correct with dialysis. Multiple causes for why K got so high including reduced K clearance with new access, ARB, diet, etc. 3.  Leukocytosis: BCx being drawn. Empirically started on Vanc/Zosyn. 4.  ESRD: Usually TTS schedule, last HD was 1/13. Starting CRRT ASAP. 5.  Hypertension/volume: Pressors being weaned, mild volume removal with CRRT. BP currently high. 6.  Anemia: Hgb 10.9, monitor. 7.  Metabolic bone disease: Ca ok. Monitor. Resume binders/sensipar once eating. 8.  DM: Per primary. 9.  Hx failed kidney transplant: On prednisone, per primary, 10.  Hypothyroidism: On synthroid, per primary.  KaVeneta PentonPA-C  03/11/2016, 11:38 AM  [facility] Pager: [phone]

## 2016-03-11 NOTE — Progress Notes (Signed)
eLink Physician-Brief Progress Note Patient Name: Renella CunasKatherine C Krahn DOB: December 08, 1960 MRN: [id number]   Date of Service  03/11/2016  HPI/Events of Note  Hypertensive, waking up & agitated p-arrest  eICU Interventions  precedex gtt fent prn If BP remains high after sedation - then BP meds     Intervention Category Major Interventions: Delirium, psychosis, severe agitation - evaluation and management  Vikram Tillett V. 03/11/2016, 7:04 PM

## 2016-03-11 NOTE — ED Notes (Signed)
Shoes and wound vac given to husband

## 2016-03-12 ENCOUNTER — Inpatient Hospital Stay (HOSPITAL_COMMUNITY): Payer: Commercial Managed Care - PPO

## 2016-03-12 ENCOUNTER — Other Ambulatory Visit (HOSPITAL_COMMUNITY): Payer: Commercial Managed Care - PPO

## 2016-03-12 DIAGNOSIS — I214 Non-ST elevation (NSTEMI) myocardial infarction: Secondary | ICD-10-CM

## 2016-03-12 DIAGNOSIS — I110 Hypertensive heart disease with heart failure: Secondary | ICD-10-CM

## 2016-03-12 DIAGNOSIS — I25700 Atherosclerosis of coronary artery bypass graft(s), unspecified, with unstable angina pectoris: Secondary | ICD-10-CM

## 2016-03-12 DIAGNOSIS — E782 Mixed hyperlipidemia: Secondary | ICD-10-CM

## 2016-03-12 LAB — BASIC METABOLIC PANEL
Anion gap: 13 (ref 5–15)
Anion gap: 13 (ref 5–15)
BUN: 68 mg/dL — ABNORMAL HIGH (ref 6–20)
BUN: 59 mg/dL — AB (ref 6–20)
CALCIUM: 8.2 mg/dL — AB (ref 8.9–10.3)
CHLORIDE: 101 mmol/L (ref 101–111)
CO2: 24 mmol/L (ref 22–32)
CO2: 25 mmol/L (ref 22–32)
CREATININE: 6.43 mg/dL — AB (ref 0.44–1.00)
CREATININE: 5.93 mg/dL — AB (ref 0.44–1.00)
Calcium: 8.3 mg/dL — ABNORMAL LOW (ref 8.9–10.3)
Chloride: 100 mmol/L — ABNORMAL LOW (ref 101–111)
GFR calc Af Amer: 8 mL/min — ABNORMAL LOW (ref >60)
GFR, EST AFRICAN AMERICAN: 7 mL/min — AB (ref >60)
GFR, EST NON AFRICAN AMERICAN: 6 mL/min — AB (ref >60)
GFR, EST NON AFRICAN AMERICAN: 7 mL/min — AB (ref >60)
GLUCOSE: 60 mg/dL — AB (ref 65–99)
Glucose, Bld: 75 mg/dL (ref 65–99)
Potassium: 5.5 mmol/L — ABNORMAL HIGH (ref 3.5–5.1)
Potassium: 5.1 mmol/L (ref 3.5–5.1)
SODIUM: 138 mmol/L (ref 135–145)
Sodium: 138 mmol/L (ref 135–145)

## 2016-03-12 LAB — GLUCOSE, CAPILLARY
GLUCOSE-CAPILLARY: 103 mg/dL — AB (ref 65–99)
GLUCOSE-CAPILLARY: 58 mg/dL — AB (ref 65–99)
GLUCOSE-CAPILLARY: 59 mg/dL — AB (ref 65–99)
GLUCOSE-CAPILLARY: 68 mg/dL (ref 65–99)
Glucose-Capillary: 151 mg/dL — ABNORMAL HIGH (ref 65–99)
Glucose-Capillary: 73 mg/dL (ref 65–99)
Glucose-Capillary: 78 mg/dL (ref 65–99)
Glucose-Capillary: 83 mg/dL (ref 65–99)
Glucose-Capillary: 97 mg/dL (ref 65–99)

## 2016-03-12 LAB — RENAL FUNCTION PANEL
ANION GAP: 8 (ref 5–15)
Albumin: 2.6 g/dL — ABNORMAL LOW (ref 3.5–5.0)
BUN: 41 mg/dL — ABNORMAL HIGH (ref 6–20)
CHLORIDE: 103 mmol/L (ref 101–111)
CO2: 28 mmol/L (ref 22–32)
Calcium: 7.8 mg/dL — ABNORMAL LOW (ref 8.9–10.3)
Creatinine, Ser: 4.49 mg/dL — ABNORMAL HIGH (ref 0.44–1.00)
GFR, EST AFRICAN AMERICAN: 12 mL/min — AB (ref >60)
GFR, EST NON AFRICAN AMERICAN: 10 mL/min — AB (ref >60)
Glucose, Bld: 81 mg/dL (ref 65–99)
Phosphorus: 6.5 mg/dL — ABNORMAL HIGH (ref 2.5–4.6)
Potassium: 4.6 mmol/L (ref 3.5–5.1)
Sodium: 139 mmol/L (ref 135–145)

## 2016-03-12 LAB — CBC
HEMATOCRIT: 29 % — AB (ref 36.0–46.0)
Hemoglobin: 9.2 g/dL — ABNORMAL LOW (ref 12.0–15.0)
MCH: 28 pg (ref 26.0–34.0)
MCHC: 31.7 g/dL (ref 30.0–36.0)
MCV: 88.4 fL (ref 78.0–100.0)
Platelets: 208 10*3/uL (ref 150–400)
RBC: 3.28 MIL/uL — ABNORMAL LOW (ref 3.87–5.11)
RDW: 18.8 % — AB (ref 11.5–15.5)
WBC: 11.1 10*3/uL — AB (ref 4.0–10.5)

## 2016-03-12 LAB — POCT I-STAT 3, ART BLOOD GAS (G3+)
Acid-Base Excess: 5 mmol/L — ABNORMAL HIGH (ref 0.0–2.0)
Bicarbonate: 28.6 mmol/L — ABNORMAL HIGH (ref 20.0–28.0)
O2 Saturation: 99 %
PH ART: 7.477 — AB (ref 7.350–7.450)
PO2 ART: 120 mmHg — AB (ref 83.0–108.0)
Patient temperature: 37.3
TCO2: 30 mmol/L (ref 0–100)
pCO2 arterial: 38.8 mmHg (ref 32.0–48.0)

## 2016-03-12 LAB — PROCALCITONIN: PROCALCITONIN: 25.42 ng/mL

## 2016-03-12 LAB — ALBUMIN: ALBUMIN: 2.7 g/dL — AB (ref 3.5–5.0)

## 2016-03-12 LAB — PHOSPHORUS: Phosphorus: 7.1 mg/dL — ABNORMAL HIGH (ref 2.5–4.6)

## 2016-03-12 LAB — TROPONIN I
TROPONIN I: 0.56 ng/mL — AB (ref <0.03)
Troponin I: 0.5 ng/mL (ref <0.03)

## 2016-03-12 LAB — MRSA PCR SCREENING: MRSA by PCR: POSITIVE — AB

## 2016-03-12 LAB — MAGNESIUM: Magnesium: 2.5 mg/dL — ABNORMAL HIGH (ref 1.7–2.4)

## 2016-03-12 MED ORDER — MIDAZOLAM HCL 2 MG/2ML IJ SOLN
2.0000 mg | INTRAMUSCULAR | Status: AC | PRN
  Administered 2016-03-12 (×3): 2 mg via INTRAVENOUS
  Filled 2016-03-12 (×4): qty 2

## 2016-03-12 MED ORDER — FENTANYL 2500MCG IN NS 250ML (10MCG/ML) PREMIX INFUSION
25.0000 ug/h | INTRAVENOUS | Status: DC
  Administered 2016-03-12: 200 ug/h via INTRAVENOUS
  Administered 2016-03-12: 50 ug/h via INTRAVENOUS
  Filled 2016-03-12 (×3): qty 250

## 2016-03-12 MED ORDER — ISOSORBIDE MONONITRATE ER 30 MG PO TB24
30.0000 mg | ORAL_TABLET | Freq: Every day | ORAL | Status: DC
  Administered 2016-03-13 – 2016-03-25 (×12): 30 mg via ORAL
  Filled 2016-03-12 (×13): qty 1

## 2016-03-12 MED ORDER — DEXTROSE 50 % IV SOLN
25.0000 mL | Freq: Once | INTRAVENOUS | Status: AC
  Administered 2016-03-12: 25 mL via INTRAVENOUS

## 2016-03-12 MED ORDER — MIDAZOLAM HCL 2 MG/2ML IJ SOLN
2.0000 mg | INTRAMUSCULAR | Status: DC | PRN
  Administered 2016-03-12 – 2016-03-13 (×3): 2 mg via INTRAVENOUS
  Filled 2016-03-12 (×2): qty 2

## 2016-03-12 MED ORDER — FENTANYL CITRATE (PF) 100 MCG/2ML IJ SOLN
50.0000 ug | Freq: Once | INTRAMUSCULAR | Status: AC
  Administered 2016-03-12: 50 ug via INTRAVENOUS

## 2016-03-12 MED ORDER — AMLODIPINE BESYLATE 10 MG PO TABS
10.0000 mg | ORAL_TABLET | Freq: Every day | ORAL | Status: DC
  Administered 2016-03-12 – 2016-03-13 (×2): 10 mg
  Filled 2016-03-12 (×2): qty 1

## 2016-03-12 MED ORDER — HYDRALAZINE HCL 20 MG/ML IJ SOLN
10.0000 mg | Freq: Three times a day (TID) | INTRAMUSCULAR | Status: DC | PRN
  Administered 2016-03-12: 10 mg via INTRAVENOUS
  Filled 2016-03-12 (×2): qty 1

## 2016-03-12 MED ORDER — ASPIRIN 81 MG PO CHEW
81.0000 mg | CHEWABLE_TABLET | Freq: Once | ORAL | Status: AC
  Administered 2016-03-12: 81 mg via ORAL
  Filled 2016-03-12: qty 1

## 2016-03-12 MED ORDER — CARVEDILOL 12.5 MG PO TABS
12.5000 mg | ORAL_TABLET | Freq: Two times a day (BID) | ORAL | Status: DC
Start: 2016-03-12 — End: 2016-03-12

## 2016-03-12 MED ORDER — LEVOTHYROXINE SODIUM 75 MCG PO TABS
75.0000 ug | ORAL_TABLET | Freq: Every day | ORAL | Status: DC
  Administered 2016-03-13: 75 ug
  Filled 2016-03-12: qty 1

## 2016-03-12 MED ORDER — PRISMASOL BGK 4/2.5 32-4-2.5 MEQ/L IV SOLN
INTRAVENOUS | Status: DC
  Administered 2016-03-12 – 2016-03-13 (×3): via INTRAVENOUS_CENTRAL
  Filled 2016-03-12 (×5): qty 5000

## 2016-03-12 MED ORDER — HYDRALAZINE HCL 20 MG/ML IJ SOLN
10.0000 mg | INTRAMUSCULAR | Status: DC | PRN
  Administered 2016-03-12 – 2016-03-16 (×6): 10 mg via INTRAVENOUS
  Filled 2016-03-12 (×6): qty 1

## 2016-03-12 MED ORDER — FENTANYL BOLUS VIA INFUSION
50.0000 ug | INTRAVENOUS | Status: DC | PRN
  Administered 2016-03-12 – 2016-03-13 (×3): 50 ug via INTRAVENOUS
  Filled 2016-03-12: qty 50

## 2016-03-12 MED ORDER — CARVEDILOL 12.5 MG PO TABS
12.5000 mg | ORAL_TABLET | Freq: Two times a day (BID) | ORAL | Status: DC
  Administered 2016-03-12 – 2016-03-13 (×2): 12.5 mg
  Filled 2016-03-12 (×2): qty 1

## 2016-03-12 MED ORDER — PRISMASOL BGK 4/2.5 32-4-2.5 MEQ/L IV SOLN
INTRAVENOUS | Status: DC
  Administered 2016-03-12 – 2016-03-14 (×4): via INTRAVENOUS_CENTRAL
  Filled 2016-03-12 (×7): qty 5000

## 2016-03-12 MED ORDER — DEXTROSE 50 % IV SOLN
1.0000 | INTRAVENOUS | Status: DC | PRN
  Administered 2016-03-12: 50 mL via INTRAVENOUS
  Filled 2016-03-12: qty 50

## 2016-03-12 MED ORDER — ATORVASTATIN CALCIUM 80 MG PO TABS
80.0000 mg | ORAL_TABLET | Freq: Every day | ORAL | Status: DC
  Administered 2016-03-12 – 2016-03-25 (×14): 80 mg via ORAL
  Filled 2016-03-12 (×15): qty 1

## 2016-03-12 MED ORDER — GABAPENTIN 300 MG PO CAPS
300.0000 mg | ORAL_CAPSULE | Freq: Every day | ORAL | Status: DC
  Administered 2016-03-12 – 2016-03-25 (×14): 300 mg via ORAL
  Filled 2016-03-12 (×13): qty 1
  Filled 2016-03-12: qty 3

## 2016-03-12 MED ORDER — PRISMASOL BGK 4/2.5 32-4-2.5 MEQ/L IV SOLN
INTRAVENOUS | Status: DC
  Administered 2016-03-12 – 2016-03-14 (×16): via INTRAVENOUS_CENTRAL
  Filled 2016-03-12 (×37): qty 5000

## 2016-03-12 MED ORDER — DEXTROSE 50 % IV SOLN
INTRAVENOUS | Status: AC
  Filled 2016-03-12: qty 50

## 2016-03-12 NOTE — Progress Notes (Signed)
Hypoglycemic Event  CBG: 59  Treatment: D50 IV 25 mL  Symptoms: None  Follow-up CBG: Time:0830 CBG Result:92  Possible Reasons for Event: Inadequate meal intake  Comments/MD notified:CCM aware.    Valerie Perez K

## 2016-03-12 NOTE — Progress Notes (Signed)
Came to room to extubate pt, per RN pt is very agitated, increased BP.  RN giving meds, NP at bedside and aware.  Pt was placed back on full vent support.

## 2016-03-12 NOTE — Progress Notes (Signed)
Hypoglycemic Event  CBG: 68  Treatment: D50 IV 50 mL  Symptoms: None  Follow-up CBG: Time:1221 CBG Result:152  Possible Reasons for Event: Inadequate meal intake  Comments/MD notified:CCM aware.     Meryl Hubers K

## 2016-03-12 NOTE — Progress Notes (Signed)
[facility] ROUNDING NOTE   Subjective:   Interval History:  Currently on CVVHD and appears stable she is awake and being actively weaned on ventilator  BP control improved   Objective:  Vital signs in last 24 hours:  Temp:  [93.9 F (34.4 C)-99.1 F (37.3 C)] 98.1 F (36.7 C) (01/17 1200) Pulse Rate:  [44-72] 63 (01/17 1200) Resp:  [9-30] 17 (01/17 1200) BP: (140-194)/(60-75) 179/65 (01/17 1117) SpO2:  [86 %-100 %] 97 % (01/17 1200) Arterial Line BP: (109-223)/(47-92) 167/59 (01/17 1200) FiO2 (%):  [40 %-100 %] 40 % (01/17 1117) Weight:  [110.2 kg (242 lb 15.2 oz)] 110.2 kg (242 lb 15.2 oz) (01/17 0500)  Weight change:  Filed Weights   03/11/16 1215 03/12/16 0500  Weight: 114.5 kg (252 lb 6.8 oz) 110.2 kg (242 lb 15.2 oz)    Intake/Output: I/O last 3 completed shifts: In: 3637.6 [I.V.:3152.6; IV Piggyback:485] Out: 1865 [Emesis/NG output:300; HTMBP:1121]   Intake/Output this shift:  Total I/O In: 301 [I.V.:186; NG/GT:40; IV Piggyback:75] Out: 314 [Other:314]  CVS- RRR RS- CTA ABD- BS present soft non-distended EXT- no edema   Basic Metabolic Panel:  Recent Labs Lab 03/11/16 0850  03/11/16 1045  03/11/16 1628 03/11/16 1725 03/11/16 1931 03/12/16 0005 03/12/16 0430  NA 139  < > 136  < > 138 138 137 138 138  K >7.5*  < > >7.5*  < > 6.6* 6.4* 5.9* 5.5* 5.1  CL 100*  < > 99*  < > 99* 100* 99* 101 100*  CO2 19*  --  19*  --  24  --   --  24 25  GLUCOSE 248*  < > 293*  < > 142* 138* 134* 75 60*  BUN 103*  < > 106*  < > 88* 86* 78* 68* 59*  CREATININE 9.43*  < > 9.61*  < > 7.98* 8.70* 7.60* 6.43* 5.93*  CALCIUM 10.1  --  9.4  --  8.6*  --   --  8.3* 8.2*  MG  --   --   --   --   --   --   --   --  2.5*  PHOS  --   --   --   --  9.2*  --   --   --  7.1*  < > = values in this interval not displayed.  Liver Function Tests:  Recent Labs Lab 03/11/16 1628 03/12/16 0430  ALBUMIN 2.9* 2.7*   No results for input(s): LIPASE, AMYLASE in the  last 168 hours. No results for input(s): AMMONIA in the last 168 hours.  CBC:  Recent Labs Lab 03/11/16 0850  03/11/16 1012 03/11/16 1518 03/11/16 1725 03/11/16 1931 03/12/16 0430  WBC 16.9*  --   --   --   --   --  11.1*  HGB 9.9*  < > 10.9* 10.5* 10.5* 11.2* 9.2*  HCT 32.1*  < > 32.0* 31.0* 31.0* 33.0* 29.0*  MCV 91.5  --   --   --   --   --  88.4  PLT 263  --   --   --   --   --  208  < > = values in this interval not displayed.  Cardiac Enzymes:  Recent Labs Lab 03/11/16 1045 03/11/16 1630 03/12/16 0005 03/12/16 0430  TROPONINI 0.21* 0.53* 0.56* 0.50*    BNP: Invalid input(s): POCBNP  CBG:  Recent Labs Lab 03/12/16 0502 03/12/16 0808 03/12/16 0834 03/12/16 1151 03/12/16 1218  GLUCAP 103* 29* 97 29 151*    Microbiology: Results for orders placed or performed during the hospital encounter of 03/11/16  Culture, blood (Routine X 2) w Reflex to ID Panel     Status: None (Preliminary result)   Collection Time: 03/11/16 10:00 AM  Result Value Ref Range Status   Specimen Description BLOOD RIGHT FOREARM  Final   Special Requests BOTTLES DRAWN AEROBIC AND ANAEROBIC  5CC  Final   Culture NO GROWTH < 12 HOURS  Final   Report Status PENDING  Incomplete  Culture, blood (Routine X 2) w Reflex to ID Panel     Status: None (Preliminary result)   Collection Time: 03/11/16 10:50 AM  Result Value Ref Range Status   Specimen Description BLOOD RIGHT FOREARM  Final   Special Requests IN PEDIATRIC BOTTLE  2CC  Final   Culture NO GROWTH < 12 HOURS  Final   Report Status PENDING  Incomplete  MRSA PCR Screening     Status: Abnormal   Collection Time: 03/11/16  1:30 PM  Result Value Ref Range Status   MRSA by PCR POSITIVE (A) NEGATIVE Final    Comment:        The GeneXpert MRSA Assay (FDA approved for NASAL specimens only), is one component of a comprehensive MRSA colonization surveillance program. It is not intended to diagnose MRSA infection nor to guide or monitor  treatment for MRSA infections. CALLED TO RN Clent Jacks BY M.WILSON     Coagulation Studies:  Recent Labs  03/11/16 1045  LABPROT 17.8*  INR 1.45    Urinalysis: No results for input(s): COLORURINE, LABSPEC, PHURINE, GLUCOSEU, HGBUR, BILIRUBINUR, KETONESUR, PROTEINUR, UROBILINOGEN, NITRITE, LEUKOCYTESUR in the last 72 hours.  Invalid input(s): APPERANCEUR    Imaging: Ct Head Wo Contrast  Result Date: 03/11/2016 CLINICAL DATA:  Dialysis patient, vomiting, recent CPR EXAM: CT HEAD WITHOUT CONTRAST TECHNIQUE: Contiguous axial images were obtained from the base of the skull through the vertex without intravenous contrast. COMPARISON:  None available FINDINGS: Brain: Mild brain atrophy without acute intracranial hemorrhage, mass lesion, definite infarction, midline shift, herniation, hydrocephalus, or extra-axial fluid collection. Normal gray-white matter differentiation. Cisterns remain patent. No cerebellar abnormality. Vascular: Intracranial atherosclerosis noted. Skull: Normal. Negative for fracture or focal lesion. Sinuses/Orbits: Orbits are symmetric. Scattered pansinus disease with small diffuse air-fluid levels can be seen with sinusitis. Other: None. IMPRESSION: Mild brain atrophy.  No acute intracranial process. Scattered sinus mucosal thickening and numerous small air-fluid levels suggesting sinusitis. Electronically Signed   By: Jerilynn Mages.  Shick M.D.   On: 03/11/2016 12:18   Dg Chest Port 1 View  Result Date: 03/12/2016 CLINICAL DATA:  Hypoxia EXAM: PORTABLE CHEST 1 VIEW COMPARISON:  March 11, 2016 FINDINGS: Endotracheal tube tip is 3.1 cm above the carina. Central catheter tip is at the cavoatrial junction. Nasogastric tube tip and side port below the diaphragm. No pneumothorax. There are bilateral pleural effusions with mild pulmonary edema ; there is slightly less interstitial pulmonary edema compared to 1 day prior. There is stable cardiomegaly with mild pulmonary venous hypertension.  No new opacity. No evident adenopathy. No bone lesions. IMPRESSION: Tube and catheter positions as described without pneumothorax. Slightly less edema compared to 1 day prior. There are pleural effusions bilaterally as well as cardiomegaly and pulmonary venous hypertension consistent with a degree of persistent congestive heart failure. No new opacity. Electronically Signed   By: Lowella Grip III M.D.   On: 03/12/2016 07:40   Dg Chest [facility] 1 View  Result  Date: 03/11/2016 CLINICAL DATA:  Central line placement EXAM: PORTABLE CHEST 1 VIEW COMPARISON:  03/11/2016 FINDINGS: Endotracheal tube in good position. Right jugular dual lumen central venous catheter tip in the lower SVC unchanged Interval placement of left jugular central venous catheter with the tip in the mid SVC. No pneumothorax. Diffuse bilateral airspace disease shows mild interval improvement especially in the right upper lobe. There remains significant consolidation in both lung bases. No significant pleural effusion. NG tube in the stomach. IMPRESSION: Left jugular central venous catheter tip in the SVC. No pneumothorax. Diffuse bilateral airspace disease shows partial clearing in the upper lobes but remains significant in the lower lobes. Endotracheal tube remains in good position. Electronically Signed   By: Franchot Gallo M.D.   On: 03/11/2016 17:23   Dg Chest Portable 1 View  Result Date: 03/11/2016 CLINICAL DATA:  Endotracheal tube placement.  Vomiting at home. EXAM: PORTABLE CHEST 1 VIEW COMPARISON:  04/28/2015 FINDINGS: Endotracheal tube tip below the clavicular heads. Dialysis catheter on the right with tip at the upper right atrium. Fairly symmetric widespread advanced airspace disease. No visible pleural effusion or pneumothorax. Cardiopericardial enlargement. Upper mediastinal width is distorted by leftward rotation. There could also be vascular pedicle thickening in the setting of CHF. IMPRESSION: 1. Endotracheal tube in good  position. 2. Advanced airspace disease, bilaterality favoring pulmonary edema. Aspiration/pneumonia is also considered given history of vomiting. Electronically Signed   By: Monte Fantasia M.D.   On: 03/11/2016 09:05   Dg Abd Portable 1 View  Result Date: 03/11/2016 CLINICAL DATA:  Orogastric tube placement EXAM: PORTABLE ABDOMEN - 1 VIEW COMPARISON:  March 02, 2015 FINDINGS: Orogastric tube tip and side port in stomach. Visualized bowel gas pattern unremarkable. There are surgical clips in the right upper quadrant region. No free air evident. IMPRESSION: Orogastric tube tip and side port in stomach. Visualized bowel gas pattern unremarkable. Electronically Signed   By: Lowella Grip III M.D.   On: 03/11/2016 11:01     Medications:   . sodium chloride Stopped (03/11/16 1814)  . sodium chloride 10 mL/hr at 03/12/16 0800  . sodium chloride Stopped (03/11/16 2100)  . dexmedetomidine Stopped (03/12/16 1200)  . fentaNYL infusion INTRAVENOUS Stopped (03/12/16 0934)  . norepinephrine (LEVOPHED) Adult infusion Stopped (03/11/16 1230)  . dialysis replacement fluid (prismasate) 200 mL/hr at 03/12/16 1143  . dialysis replacement fluid (prismasate) 300 mL/hr at 03/12/16 1145  . dialysate (PRISMASATE) 2,000 mL/hr at 03/12/16 1145   . amLODipine  10 mg Per Tube Daily  . carvedilol  12.5 mg Per Tube BID WC  . chlorhexidine gluconate (MEDLINE KIT)  15 mL Mouth Rinse BID  . Chlorhexidine Gluconate Cloth  6 each Topical Q0600  . famotidine (PEPCID) IV  10 mg Intravenous Q24H  . gabapentin  300 mg Oral QHS  . heparin subcutaneous  5,000 Units Subcutaneous Q8H  . [START ON 03/13/2016] levothyroxine  75 mcg Per Tube QAC breakfast  . mouth rinse  15 mL Mouth Rinse 10 times per day  . mupirocin ointment  1 application Nasal BID  . piperacillin-tazobactam  3.375 g Intravenous Q6H  . sodium chloride flush  10-40 mL Intracatheter Q12H  . vancomycin  1,000 mg Intravenous Q24H   dextrose, fentaNYL,  fentaNYL (SUBLIMAZE) injection, heparin, hydrALAZINE, midazolam, midazolam, sodium chloride, sodium chloride flush  Assessment/ Plan:   Cardiogenic Shock s/p cardiac arrest will need cardiac cath  Hyperkalemia resolved  Change to 4/2.5 bags   ESRD  Now on CRRT will be  able to transition to Hemodialysis  Hypotension improved   Anemia stable  Dm controlled  Metabolic Bone Disease stable  Hypothyroid  Synthroid  Failed Kidney Transplant    LOS: 1 Anjenette Gerbino W @TODAY @12 :25 PM

## 2016-03-12 NOTE — Consult Note (Signed)
WOC Nurse wound consult note Reason for Consult: Consult requested for abd wound.  Pt is unable to speak related to intubation, but daughter at the bedside states she is on a Vac dressing at home, which has been removed.  [facility]. Wound type: Chronic full thickness wound to midline abd Measurement:1.8X6.5X.1cm Wound bed: 5% yellow interspersed throughout 95% red moist wound bed, mod amt yellow drainage, no odor or fluctuance. Periwound: Intact skin surrounding Dressing procedure/placement/frequency: Wound is shallow and healing well; Vac is no longer indicated.  Aquacel applied to absorb drainage and provide antibacterial benefits. Pt can resume previous plan of care after discharge and [facility] after discharge. Discussed plan of care with daughter at the bedside and she verbalized understanding. Please re-consult if further assistance is needed.  Thank-you,  Cammie Mcgeeawn Karen Kinnard MSN, RN, CWOCN, [place], CNS [phone]

## 2016-03-12 NOTE — Progress Notes (Signed)
PULMONARY / CRITICAL CARE MEDICINE PROGRESS NOTE   Name: Valerie CunasKatherine C Halberstadt MRN: [id number] DOB: 06/19/1960    ADMISSION DATE:  03/11/2016 CONSULTATION DATE:  1/16  CHIEF COMPLAINT:  Arrest  SUBJECTIVE: No acute events overnight.   VITAL SIGNS: BP (!) 194/75   Pulse 61   Temp 99.1 F (37.3 C) (Core (Comment))   Resp 12   Ht 5\' 5"  (1.651 m)   Wt 242 lb 15.2 oz (110.2 kg)   SpO2 100%   BMI 40.43 kg/m   HEMODYNAMICS:    VENTILATOR SETTINGS: Vent Mode: PRVC FiO2 (%):  [50 %-100 %] 50 % Set Rate:  [26 bmp] 26 bmp Vt Set:  [500 mL] 500 mL PEEP:  [8 cmH20] 8 cmH20 Plateau Pressure:  [21 cmH20-25 cmH20] 23 cmH20  INTAKE / OUTPUT: I/O last 3 completed shifts: In: 3637.6 [I.V.:3152.6; IV Piggyback:485] Out: 1865 [Emesis/NG output:300; Other:1565]  PHYSICAL EXAMINATION: General:  Morbidly obese female in no acute distress on the ventilator Neuro: RASS +1, moves all extremities, follows commands, slightly agitated HEENT:  Normocephalic atraumatic no appreciable JVD Cardiovascular:  Regular rate and rhythm Lungs:  Clear bilateral breath sounds Abdomen:  Soft, nondistended, wound on abdomen with dressings in place  Ext: Trace lower extremity edema bilaterally  LABS:  BMET  Recent Labs Lab 03/11/16 1628  03/11/16 1931 03/12/16 0005 03/12/16 0430  NA 138  < > 137 138 138  K 6.6*  < > 5.9* 5.5* 5.1  CL 99*  < > 99* 101 100*  CO2 24  --   --  24 25  BUN 88*  < > 78* 68* 59*  CREATININE 7.98*  < > 7.60* 6.43* 5.93*  GLUCOSE 142*  < > 134* 75 60*  < > = values in this interval not displayed.  Electrolytes  Recent Labs Lab 03/11/16 1628 03/12/16 0005 03/12/16 0430  CALCIUM 8.6* 8.3* 8.2*  MG  --   --  2.5*  PHOS 9.2*  --  7.1*    CBC  Recent Labs Lab 03/11/16 0850  03/11/16 1725 03/11/16 1931 03/12/16 0430  WBC 16.9*  --   --   --  11.1*  HGB 9.9*  < > 10.5* 11.2* 9.2*  HCT 32.1*  < > 31.0* 33.0* 29.0*  PLT 263  --   --   --  208  < > = values in  this interval not displayed.  Coag's  Recent Labs Lab 03/11/16 1045  APTT 33  INR 1.45    Sepsis Markers  Recent Labs Lab 03/11/16 0850 03/11/16 0900 03/11/16 1146 03/12/16 0430  LATICACIDVEN  --  7.49* 1.64  --   PROCALCITON 0.13  --   --  25.42    ABG  Recent Labs Lab 03/11/16 1009 03/11/16 1130 03/12/16 0347  PHART 7.223* 7.363 7.477*  PCO2ART 51.0* 38.9 38.8  PO2ART 84.0 77.0* 120.0*    Liver Enzymes  Recent Labs Lab 03/11/16 1628 03/12/16 0430  ALBUMIN 2.9* 2.7*    Cardiac Enzymes  Recent Labs Lab 03/11/16 1630 03/12/16 0005 03/12/16 0430  TROPONINI 0.53* 0.56* 0.50*    Glucose  Recent Labs Lab 03/11/16 2106 03/12/16 0010 03/12/16 0440 03/12/16 0502 03/12/16 0808 03/12/16 0834  GLUCAP 87 78 58* 103* 59* 97    Imaging Ct Head Wo Contrast  Result Date: 03/11/2016 CLINICAL DATA:  Dialysis patient, vomiting, recent CPR EXAM: CT HEAD WITHOUT CONTRAST TECHNIQUE: Contiguous axial images were obtained from the base of the skull through the vertex without intravenous  contrast. COMPARISON:  None available FINDINGS: Brain: Mild brain atrophy without acute intracranial hemorrhage, mass lesion, definite infarction, midline shift, herniation, hydrocephalus, or extra-axial fluid collection. Normal gray-white matter differentiation. Cisterns remain patent. No cerebellar abnormality. Vascular: Intracranial atherosclerosis noted. Skull: Normal. Negative for fracture or focal lesion. Sinuses/Orbits: Orbits are symmetric. Scattered pansinus disease with small diffuse air-fluid levels can be seen with sinusitis. Other: None. IMPRESSION: Mild brain atrophy.  No acute intracranial process. Scattered sinus mucosal thickening and numerous small air-fluid levels suggesting sinusitis. Electronically Signed   By: Judie Petit.  Shick M.D.   On: 03/11/2016 12:18   Dg Chest Port 1 View  Result Date: 03/12/2016 CLINICAL DATA:  Hypoxia EXAM: PORTABLE CHEST 1 VIEW COMPARISON:   March 11, 2016 FINDINGS: Endotracheal tube tip is 3.1 cm above the carina. Central catheter tip is at the cavoatrial junction. Nasogastric tube tip and side port below the diaphragm. No pneumothorax. There are bilateral pleural effusions with mild pulmonary edema ; there is slightly less interstitial pulmonary edema compared to 1 day prior. There is stable cardiomegaly with mild pulmonary venous hypertension. No new opacity. No evident adenopathy. No bone lesions. IMPRESSION: Tube and catheter positions as described without pneumothorax. Slightly less edema compared to 1 day prior. There are pleural effusions bilaterally as well as cardiomegaly and pulmonary venous hypertension consistent with a degree of persistent congestive heart failure. No new opacity. Electronically Signed   By: Bretta Bang III M.D.   On: 03/12/2016 07:40   Dg Chest Port 1 View  Result Date: 03/11/2016 CLINICAL DATA:  Central line placement EXAM: PORTABLE CHEST 1 VIEW COMPARISON:  03/11/2016 FINDINGS: Endotracheal tube in good position. Right jugular dual lumen central venous catheter tip in the lower SVC unchanged Interval placement of left jugular central venous catheter with the tip in the mid SVC. No pneumothorax. Diffuse bilateral airspace disease shows mild interval improvement especially in the right upper lobe. There remains significant consolidation in both lung bases. No significant pleural effusion. NG tube in the stomach. IMPRESSION: Left jugular central venous catheter tip in the SVC. No pneumothorax. Diffuse bilateral airspace disease shows partial clearing in the upper lobes but remains significant in the lower lobes. Endotracheal tube remains in good position. Electronically Signed   By: Marlan Palau M.D.   On: 03/11/2016 17:23   Dg Abd Portable 1 View  Result Date: 03/11/2016 CLINICAL DATA:  Orogastric tube placement EXAM: PORTABLE ABDOMEN - 1 VIEW COMPARISON:  March 02, 2015 FINDINGS: Orogastric tube tip  and side port in stomach. Visualized bowel gas pattern unremarkable. There are surgical clips in the right upper quadrant region. No free air evident. IMPRESSION: Orogastric tube tip and side port in stomach. Visualized bowel gas pattern unremarkable. Electronically Signed   By: Bretta Bang III M.D.   On: 03/11/2016 11:01   STUDIES:  CT head 1/16 >Mild brain atrophy.  No acute intracranial process. Scattered sinus mucosal thickening and numerous small air-fluid levels suggesting sinusitis. CXR 1/7 > Tube and catheter positions as described without pneumothorax. Slightly less edema compared to 1 day prior. There are pleural effusions bilaterally as well as cardiomegaly and pulmonary venous hypertension consistent with a degree of persistent congestive heart failure. No new opacity.  CULTURES: BCx2 1/16 > NGTD  ANTIBIOTICS: Zosyn 1/16 >> Vancomycin 1/16 >>  SIGNIFICANT EVENTS: 1/16 Cardiac arrest  LINES/TUBES: Right IJ tunneled dialysis catheter 1/16 ETT > Left IJ 1/16 >  DISCUSSION: 56 year old female with end-stage renal disease on dialysis suffered VF/PEA cardiac arrest for  in total just over 45 minutes. Potassium found to be greater than 7.5. Admitted to the pulmonary critical care team with plans for normothermia protocol due to prolonged downtime, fluctuant abnormalities, refractory shock.  ASSESSMENT / PLAN:  PULMONARY A: Acute hypercarbic respiratory failure Sleep apnea Pulmonary Edema P:   Full vent support SBT, daily WUA VAP prevention bundle On Precedex and Fentanyl, versed prn  May be able to wean and extubate later today CPAP QHS once extubated  CARDIOVASCULAR A:  Cardiogenic shock secondary to cardiac arrest Now Hypertensive LVH History of hypertension, hyperlipidemia P:  Telemetry monitoring Off Levophed HTN this morning likely due to agitation Restarted home coreg and amlodipine Hydralazine prn  Cardiology plans to cath if improving  neurologically Normothermia at 37 given electrolyte abnormalities Off Amiodarone infusion  RENAL A:   ESRD on dialysis TTS Severe hyperkalemia > Improved to 5.1 this am 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 P:   Nephrology following  CRRT BMET BID ?Restart home prednisone 5 mg QD  GASTROINTESTINAL A:   No acute issues Chronic Abdominal Wound P:   NPO Renal dosed Pepcid for stress ulcer prophylaxis Start tube feeds 1/17 Uses wound vac at home, will consult wound care  HEMATOLOGIC A:   Anemia of chronic illness P:  Follow CBC Heparin TID  INFECTIOUS A:   Leukocytosis - WBC 16.9 >11.1, BCx NGTD, possibly due to HCAP versus abdominal wound P:   On Vanc/Zosyn Follow WBC and fever curve Wound consult for abdominal wound  ENDOCRINE A:   Diabetes mellitus  Hypoglycemic P:   CBG monitoring  D50 prn   NEUROLOGIC A:   Acute anoxic encephalopathy P:   RASS goal: 0 On fentanyl and precedex Versed prn   FAMILY  - Updates:  Husband and daughter updated1/16.  - Inter-disciplinary family meet or Palliative Care meeting due by:  1/22  Karlene Lineman, DO PGY-3 Internal Medicine Resident Pager # [phone] 03/12/2016 9:58 AM

## 2016-03-12 NOTE — Progress Notes (Signed)
Initial Nutrition Assessment  DOCUMENTATION CODES:   Morbid obesity  INTERVENTION:   Recommend:  Nepro @ 20 ml/hr (480 ml/day) 60 ml Prostat TID Provides: 1464 kcal, 128 grams protein, and 348 ml H2O.    NUTRITION DIAGNOSIS:   Increased nutrient needs related to wound healing (dialysis) as evidenced by estimated needs.  GOAL:   Provide needs based on ASPEN/SCCM guidelines  MONITOR:   Skin, I & O's, Vent status  REASON FOR ASSESSMENT:   Ventilator    ASSESSMENT:   56 year old female with end-stage renal disease on dialysis suffered VF/PEA cardiac arrest for in total just over 45 minutes. Potassium found to be greater than 7.5. Admitted to the pulmonary critical care team with plans for normothermia protocol due to prolonged downtime, fluctuant abnormalities, refractory shock.  Pt awake and alert, daughter at bedside. Per their report no recent weight changes and she does not usually have edema problems between dialysis treatments. Good appetite mostly, does eat less on days she has dialysis. Abdominal wound with wound VAC at home for the last few months. Pt did feel it was better.  Currently on CRRT.  Patient is currently intubated on ventilator support MV: 13 L/min Temp (24hrs), Avg:97 F (36.1 C), Min:93.7 F (34.3 C), Max:99.1 F (37.3 C)  CBG's: 103-59-97 Labs reviewed: PO4 7.1, magnesium 2.5   Diet Order:  Diet NPO time specified  Skin:  Wound (see comment) (abdominal wound)  Last BM:  unknown  Height:   Ht Readings from Last 1 Encounters:  03/11/16 5\' 5"  (1.651 m)    Weight:   Wt Readings from Last 1 Encounters:  03/12/16 242 lb 15.2 oz (110.2 kg)    Ideal Body Weight:  56.8 kg  BMI:  Body mass index is 40.43 kg/m.  Estimated Nutritional Needs:   Kcal:  [id number]  Protein:  >/= 113 grams  Fluid:  1.2 L/day  EDUCATION NEEDS:   No education needs identified at this time  Kendell BaneHeather Tien Spooner RD, LDN, CNSC [phone] Pager [phone] After  Hours Pager

## 2016-03-12 NOTE — Progress Notes (Signed)
Pt desat to 87-88% fio2.  Increased fio2 to 60%, performed recruitment maneuver x 2 minutes.  Suctioned for mod. Amt thick tan/blood tinged secretions.  RN aware.

## 2016-03-12 NOTE — Progress Notes (Signed)
Patient Name: Valerie Perez Date of Encounter: 03/12/2016  Active Problems:   Cardiac arrest ([place])   Length of Stay: 1  SUBJECTIVE  Intubated, but alert, responding to commands and questions.   CURRENT MEDS . amLODipine  10 mg Per Tube Daily  . carvedilol  12.5 mg Per Tube BID WC  . chlorhexidine gluconate (MEDLINE KIT)  15 mL Mouth Rinse BID  . Chlorhexidine Gluconate Cloth  6 each Topical Q0600  . famotidine (PEPCID) IV  10 mg Intravenous Q24H  . gabapentin  300 mg Oral QHS  . heparin subcutaneous  5,000 Units Subcutaneous Q8H  . [START ON 03/13/2016] levothyroxine  75 mcg Per Tube QAC breakfast  . mouth rinse  15 mL Mouth Rinse 10 times per day  . mupirocin ointment  1 application Nasal BID  . piperacillin-tazobactam  3.375 g Intravenous Q6H  . sodium chloride flush  10-40 mL Intracatheter Q12H  . vancomycin  1,000 mg Intravenous Q24H    OBJECTIVE  Vitals:   03/12/16 1000 03/12/16 1100 03/12/16 1117 03/12/16 1200  BP:   (!) 179/65   Pulse: 64 64 65 63  Resp: _0 Temp:    98.1 F (36.7 C)  TempSrc:    Core (Comment)  SpO2: 92% 97% 97% 97%  Weight:      Height:        Intake/Output Summary (Last 24 hours) at 03/12/16 1253 Last data filed at 03/12/16 1200  Gross per 24 hour  Intake          1653.88 ml  Output             2179 ml  Net          -525.12 ml   Filed Weights   03/11/16 1215 03/12/16 0500  Weight: 252 lb 6.8 oz (114.5 kg) 242 lb 15.2 oz (110.2 kg)    PHYSICAL EXAM  General: Intubated Neck: Supple without bruits or JVD. Lungs:  Resp regular and unlabored, CTA. Heart: RRR no s3, s4, or murmurs. Abdomen: Soft, non-tender, non-distended, BS + x 4.  Extremities: No clubbing, cyanosis or edema. DP/PT/Radials 2+ and equal bilaterally.  Accessory Clinical Findings  CBC  Recent Labs  03/11/16 0850  03/11/16 1931 03/12/16 0430  WBC 16.9*  --   --  11.1*  HGB 9.9*  < > 11.2* 9.2*  HCT 32.1*  < > 33.0* 29.0*  MCV 91.5  --   --   88.4  PLT 263  --   --  208  < > = values in this interval not displayed. Basic Metabolic Panel  Recent Labs  03/11/16 1628  03/12/16 0005 03/12/16 0430  NA 138  < > 138 138  K 6.6*  < > 5.5* 5.1  CL 99*  < > 101 100*  CO2 24  --  24 25  GLUCOSE 142*  < > 75 60*  BUN 88*  < > 68* 59*  CREATININE 7.98*  < > 6.43* 5.93*  CALCIUM 8.6*  --  8.3* 8.2*  MG  --   --   --  2.5*  PHOS 9.2*  --   --  7.1*  < > = values in this interval not displayed. Liver Function Tests  Recent Labs  03/11/16 1628 03/12/16 0430  ALBUMIN 2.9* 2.7*   No results for input(s): LIPASE, AMYLASE in the last 72 hours. Cardiac Enzymes  Recent Labs  03/11/16 1630 03/12/16 0005 03/12/16 0430  TROPONINI 0.53*  0.56* 0.50*   BNP Invalid input(s): POCBNP D-Dimer No results for input(s): DDIMER in the last 72 hours. Hemoglobin A1C No results for input(s): HGBA1C in the last 72 hours. Fasting Lipid Panel No results for input(s): CHOL, HDL, LDLCALC, TRIG, CHOLHDL, LDLDIRECT in the last 72 hours. Thyroid Function Tests No results for input(s): TSH, T4TOTAL, T3FREE, THYROIDAB in the last 72 hours.  Invalid input(s): FREET3  Radiology/Studies  Ct Head Wo Contrast  Result Date: 03/11/2016 CLINICAL DATA:  Dialysis patient, vomiting, recent CPR EXAM: CT HEAD WITHOUT CONTRAST TECHNIQUE: Contiguous axial images were obtained from the base of the skull through the vertex without intravenous contrast. COMPARISON:  None available FINDINGS: Brain: Mild brain atrophy without acute intracranial hemorrhage, mass lesion, definite infarction, midline shift, herniation, hydrocephalus, or extra-axial fluid collection. Normal gray-white matter differentiation. Cisterns remain patent. No cerebellar abnormality. Vascular: Intracranial atherosclerosis noted. Skull: Normal. Negative for fracture or focal lesion. Sinuses/Orbits: Orbits are symmetric. Scattered pansinus disease with small diffuse air-fluid levels can be seen  with sinusitis. Other: None. IMPRESSION: Mild brain atrophy.  No acute intracranial process. Scattered sinus mucosal thickening and numerous small air-fluid levels suggesting sinusitis. Electronically Signed   By: Jerilynn Mages.  Shick M.D.   On: 03/11/2016 12:18   Dg Chest Port 1 View  Result Date: 03/12/2016 CLINICAL DATA:  Hypoxia EXAM: PORTABLE CHEST 1 VIEW COMPARISON:  March 11, 2016 FINDINGS: Endotracheal tube tip is 3.1 cm above the carina. Central catheter tip is at the cavoatrial junction. Nasogastric tube tip and side port below the diaphragm. No pneumothorax. There are bilateral pleural effusions with mild pulmonary edema ; there is slightly less interstitial pulmonary edema compared to 1 day prior. There is stable cardiomegaly with mild pulmonary venous hypertension. No new opacity. No evident adenopathy. No bone lesions. IMPRESSION: Tube and catheter positions as described without pneumothorax. Slightly less edema compared to 1 day prior. There are pleural effusions bilaterally as well as cardiomegaly and pulmonary venous hypertension consistent with a degree of persistent congestive heart failure. No new opacity. Electronically Signed   By: Lowella Grip III M.D.   On: 03/12/2016 07:40   Dg Chest Port 1 View  Result Date: 03/11/2016 CLINICAL DATA:  Central line placement EXAM: PORTABLE CHEST 1 VIEW COMPARISON:  03/11/2016 FINDINGS: Endotracheal tube in good position. Right jugular dual lumen central venous catheter tip in the lower SVC unchanged Interval placement of left jugular central venous catheter with the tip in the mid SVC. No pneumothorax. Diffuse bilateral airspace disease shows mild interval improvement especially in the right upper lobe. There remains significant consolidation in both lung bases. No significant pleural effusion. NG tube in the stomach. IMPRESSION: Left jugular central venous catheter tip in the SVC. No pneumothorax. Diffuse bilateral airspace disease shows partial  clearing in the upper lobes but remains significant in the lower lobes. Endotracheal tube remains in good position. Electronically Signed   By: Franchot Gallo M.D.   On: 03/11/2016 17:23   Dg Chest Portable 1 View  Result Date: 03/11/2016 CLINICAL DATA:  Endotracheal tube placement.  Vomiting at home. EXAM: PORTABLE CHEST 1 VIEW COMPARISON:  04/28/2015 FINDINGS: Endotracheal tube tip below the clavicular heads. Dialysis catheter on the right with tip at the upper right atrium. Fairly symmetric widespread advanced airspace disease. No visible pleural effusion or pneumothorax. Cardiopericardial enlargement. Upper mediastinal width is distorted by leftward rotation. There could also be vascular pedicle thickening in the setting of CHF. IMPRESSION: 1. Endotracheal tube in good position. 2. Advanced airspace disease,  bilaterality favoring pulmonary edema. Aspiration/pneumonia is also considered given history of vomiting. Electronically Signed   By: Monte Fantasia M.D.   On: 03/11/2016 09:05   Dg Abd Portable 1 View  Result Date: 03/11/2016 CLINICAL DATA:  Orogastric tube placement EXAM: PORTABLE ABDOMEN - 1 VIEW COMPARISON:  March 02, 2015 FINDINGS: Orogastric tube tip and side port in stomach. Visualized bowel gas pattern unremarkable. There are surgical clips in the right upper quadrant region. No free air evident. IMPRESSION: Orogastric tube tip and side port in stomach. Visualized bowel gas pattern unremarkable. Electronically Signed   By: Lowella Grip III M.D.   On: 03/11/2016 11:01   TELE: SR   ASSESSMENT AND PLAN  1. 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 However the patient has had great neurologic recovery, she is alert and follows commands. Extubation planned for today.  The patient had a Lexiscan nuclear stress test in July 2017 as part of pre kidney transplant workup and it was positive for Moderate mid to distal anterior wall  inducible perfusion defect. No evidence of fixed perfusion defect. LVEF > 70%. Her cath was scheduled for later this month in [place].  Max troponin yesterday 0.53. We will plan for a LHC once the patient recovers, possibly later this week.  She is hypertensive, I will start imdur 30 mg po daily, continue carvedilol 12.5 mg po BID, amlodipine 10 mg po daily. Start ASA 17m po daily and atorvastatin 80 mg po daily.  Echo showed normal LVEF, no regional wall motion abnormalities.    Signed, KEna DawleyMD, [facility]1/17/2018

## 2016-03-12 NOTE — Progress Notes (Signed)
Patient has reached up for her ET tube multiple times and given me a pulling out motion.Each time I have reoriented her especially explaining what has happened to her in the last day. I have had to increase her precedex gtt multiple times because despite reorientation, redirection, repositioning and other nonpharmalogical interventions she remains restless and agitated at times.

## 2016-03-12 NOTE — Progress Notes (Signed)
Multiple alarms from [employer] sun machine reported during day shift about temperature of water. Concerns that may be effected due to CRRT process and possibly blood warmer. During night shift patient temperature labile and blood warmer on CRRT adjusted to try and fix. Attempted to confirm esophageal probe temperature that machine is using. [employer] Sun machine reading 36.8 C/98.2 F, Axillary 99.1 F, oral 100.1 F.  Adjusted esophageal probe by pushing in further and reading increased to 37.6 C/99.7 F, more in line with oral temperature reading. Will continue to monitor for accuracy. Blood warmer turned off on CRRT machine.

## 2016-03-13 ENCOUNTER — Inpatient Hospital Stay (HOSPITAL_COMMUNITY): Payer: Commercial Managed Care - PPO

## 2016-03-13 DIAGNOSIS — M79609 Pain in unspecified limb: Secondary | ICD-10-CM

## 2016-03-13 DIAGNOSIS — I160 Hypertensive urgency: Secondary | ICD-10-CM

## 2016-03-13 LAB — RENAL FUNCTION PANEL
ALBUMIN: 2.5 g/dL — AB (ref 3.5–5.0)
ALBUMIN: 2.6 g/dL — AB (ref 3.5–5.0)
ANION GAP: 10 (ref 5–15)
ANION GAP: 11 (ref 5–15)
BUN: 31 mg/dL — AB (ref 6–20)
BUN: 23 mg/dL — AB (ref 6–20)
CHLORIDE: 103 mmol/L (ref 101–111)
CO2: 25 mmol/L (ref 22–32)
CO2: 25 mmol/L (ref 22–32)
Calcium: 7.9 mg/dL — ABNORMAL LOW (ref 8.9–10.3)
Calcium: 8.2 mg/dL — ABNORMAL LOW (ref 8.9–10.3)
Chloride: 102 mmol/L (ref 101–111)
Creatinine, Ser: 3.43 mg/dL — ABNORMAL HIGH (ref 0.44–1.00)
Creatinine, Ser: 2.9 mg/dL — ABNORMAL HIGH (ref 0.44–1.00)
GFR calc Af Amer: 16 mL/min — ABNORMAL LOW (ref >60)
GFR calc Af Amer: 20 mL/min — ABNORMAL LOW (ref >60)
GFR calc non Af Amer: 14 mL/min — ABNORMAL LOW (ref >60)
GFR calc non Af Amer: 17 mL/min — ABNORMAL LOW (ref >60)
GLUCOSE: 67 mg/dL (ref 65–99)
GLUCOSE: 117 mg/dL — AB (ref 65–99)
PHOSPHORUS: 4.8 mg/dL — AB (ref 2.5–4.6)
PHOSPHORUS: 4.6 mg/dL (ref 2.5–4.6)
POTASSIUM: 4.5 mmol/L (ref 3.5–5.1)
POTASSIUM: 4.6 mmol/L (ref 3.5–5.1)
Sodium: 138 mmol/L (ref 135–145)
Sodium: 138 mmol/L (ref 135–145)

## 2016-03-13 LAB — BLOOD GAS, ARTERIAL
ACID-BASE EXCESS: 3 mmol/L — AB (ref 0.0–2.0)
Bicarbonate: 26.6 mmol/L (ref 20.0–28.0)
FIO2: 50
O2 SAT: 100 %
PATIENT TEMPERATURE: 98.6
PCO2 ART: 37.8 mmHg (ref 32.0–48.0)
PEEP/CPAP: 5 cmH2O
PO2 ART: 192 mmHg — AB (ref 83.0–108.0)
PRESSURE SUPPORT: 5 cmH2O
pH, Arterial: 7.462 — ABNORMAL HIGH (ref 7.350–7.450)

## 2016-03-13 LAB — MAGNESIUM: Magnesium: 2.3 mg/dL (ref 1.7–2.4)

## 2016-03-13 LAB — CBC
HCT: 28.9 % — ABNORMAL LOW (ref 36.0–46.0)
HEMOGLOBIN: 8.9 g/dL — AB (ref 12.0–15.0)
MCH: 28.5 pg (ref 26.0–34.0)
MCHC: 30.8 g/dL (ref 30.0–36.0)
MCV: 92.6 fL (ref 78.0–100.0)
PLATELETS: 174 10*3/uL (ref 150–400)
RBC: 3.12 MIL/uL — ABNORMAL LOW (ref 3.87–5.11)
RDW: 19 % — ABNORMAL HIGH (ref 11.5–15.5)
WBC: 10.6 10*3/uL — ABNORMAL HIGH (ref 4.0–10.5)

## 2016-03-13 LAB — GLUCOSE, CAPILLARY
GLUCOSE-CAPILLARY: 115 mg/dL — AB (ref 65–99)
GLUCOSE-CAPILLARY: 129 mg/dL — AB (ref 65–99)
GLUCOSE-CAPILLARY: 177 mg/dL — AB (ref 65–99)
GLUCOSE-CAPILLARY: 69 mg/dL (ref 65–99)
GLUCOSE-CAPILLARY: 78 mg/dL (ref 65–99)
Glucose-Capillary: 73 mg/dL (ref 65–99)
Glucose-Capillary: 72 mg/dL (ref 65–99)
Glucose-Capillary: 98 mg/dL (ref 65–99)

## 2016-03-13 LAB — PROCALCITONIN: Procalcitonin: 18.04 ng/mL

## 2016-03-13 MED ORDER — LABETALOL HCL 200 MG PO TABS: 200.0000 mg | ORAL_TABLET | Freq: Three times a day (TID) | ORAL | Status: DC

## 2016-03-13 MED ORDER — HYDRALAZINE HCL 25 MG PO TABS: 25.0000 mg | ORAL_TABLET | Freq: Three times a day (TID) | ORAL | Status: DC

## 2016-03-13 MED ORDER — ACETAMINOPHEN 325 MG PO TABS
650.0000 mg | ORAL_TABLET | Freq: Four times a day (QID) | ORAL | Status: DC | PRN
  Administered 2016-03-13 – 2016-03-25 (×15): 650 mg via ORAL
  Filled 2016-03-13 (×15): qty 2

## 2016-03-13 MED ORDER — DEXTROSE 50 % IV SOLN
INTRAVENOUS | Status: AC
  Filled 2016-03-13: qty 50

## 2016-03-13 MED ORDER — ACETAMINOPHEN 650 MG RE SUPP
650.0000 mg | Freq: Four times a day (QID) | RECTAL | Status: DC | PRN
Start: 2016-03-13 — End: 2016-03-26

## 2016-03-13 MED ORDER — PREDNISONE 5 MG PO TABS
5.0000 mg | ORAL_TABLET | Freq: Every day | ORAL | Status: DC
  Administered 2016-03-13 – 2016-03-25 (×13): 5 mg via ORAL
  Filled 2016-03-13 (×13): qty 1

## 2016-03-13 MED ORDER — CARVEDILOL 25 MG PO TABS: 25.0000 mg | ORAL_TABLET | Freq: Two times a day (BID) | ORAL | Status: DC

## 2016-03-13 MED ORDER — HYDRALAZINE HCL 20 MG/ML IJ SOLN
10.0000 mg | Freq: Once | INTRAMUSCULAR | Status: AC
  Administered 2016-03-13: 10 mg via INTRAVENOUS

## 2016-03-13 MED ORDER — HEPARIN BOLUS VIA INFUSION
3000.0000 [IU] | Freq: Once | INTRAVENOUS | Status: AC
  Administered 2016-03-13: 3000 [IU] via INTRAVENOUS
  Filled 2016-03-13: qty 3000

## 2016-03-13 MED ORDER — NICARDIPINE HCL IN NACL 20-0.86 MG/200ML-% IV SOLN
3.0000 mg/h | INTRAVENOUS | Status: DC
  Administered 2016-03-13: 5 mg/h via INTRAVENOUS
  Administered 2016-03-13: 10 mg/h via INTRAVENOUS
  Filled 2016-03-13 (×4): qty 200

## 2016-03-13 MED ORDER — CARVEDILOL 12.5 MG PO TABS: 12.5000 mg | ORAL_TABLET | Freq: Two times a day (BID) | ORAL | Status: DC

## 2016-03-13 MED ORDER — AMLODIPINE BESYLATE 10 MG PO TABS
10.0000 mg | ORAL_TABLET | Freq: Every day | ORAL | Status: DC
  Administered 2016-03-14 – 2016-03-25 (×11): 10 mg via ORAL
  Filled 2016-03-13 (×11): qty 1

## 2016-03-13 MED ORDER — HYDRALAZINE HCL 25 MG PO TABS
25.0000 mg | ORAL_TABLET | Freq: Three times a day (TID) | ORAL | Status: DC
  Administered 2016-03-13 – 2016-03-14 (×3): 25 mg via ORAL
  Filled 2016-03-13 (×3): qty 1

## 2016-03-13 MED ORDER — LEVOTHYROXINE SODIUM 75 MCG PO TABS
75.0000 ug | ORAL_TABLET | Freq: Every day | ORAL | Status: DC
  Administered 2016-03-14 – 2016-03-25 (×12): 75 ug via ORAL
  Filled 2016-03-13 (×12): qty 1

## 2016-03-13 MED ORDER — CARVEDILOL 12.5 MG PO TABS
12.5000 mg | ORAL_TABLET | Freq: Two times a day (BID) | ORAL | Status: DC
  Administered 2016-03-13 – 2016-03-14 (×2): 12.5 mg via ORAL
  Filled 2016-03-13 (×2): qty 1

## 2016-03-13 MED ORDER — HEPARIN (PORCINE) IN NACL 100-0.45 UNIT/ML-% IJ SOLN
1350.0000 [IU]/h | INTRAMUSCULAR | Status: DC
  Administered 2016-03-13: 1200 [IU]/h via INTRAVENOUS
  Filled 2016-03-13 (×2): qty 250

## 2016-03-13 MED ORDER — NICARDIPINE HCL IN NACL 40-0.83 MG/200ML-% IV SOLN
3.0000 mg/h | INTRAVENOUS | Status: DC
  Administered 2016-03-13: 9 mg/h via INTRAVENOUS
  Administered 2016-03-14: 10 mg/h via INTRAVENOUS
  Administered 2016-03-14: 12.5 mg/h via INTRAVENOUS
  Administered 2016-03-14 (×2): 10 mg/h via INTRAVENOUS
  Administered 2016-03-14 – 2016-03-15 (×6): 15 mg/h via INTRAVENOUS
  Filled 2016-03-13 (×14): qty 200

## 2016-03-13 MED ORDER — WHITE PETROLATUM GEL
Status: DC | PRN
Start: 2016-03-13 — End: 2016-03-26
  Filled 2016-03-13: qty 1

## 2016-03-13 MED ORDER — NITROGLYCERIN IN D5W 200-5 MCG/ML-% IV SOLN
0.0000 ug/min | INTRAVENOUS | Status: DC
  Administered 2016-03-13: 5 ug/min via INTRAVENOUS
  Filled 2016-03-13: qty 250

## 2016-03-13 MED ORDER — DEXTROSE 50 % IV SOLN
25.0000 mL | Freq: Once | INTRAVENOUS | Status: AC
  Administered 2016-03-13: 25 mL via INTRAVENOUS

## 2016-03-13 NOTE — Procedures (Signed)
CRRT  Removing 50 cc/hr    Hb 8.9    (  9.2 ) < ------   ( 11.2 )   K 4.5   Ca 7.9   Phos 4.8    Alb 2.5   BP  220/100     Coreg 12.5 BID    Amlodipine 10 QD   Wound abdominal   Clean and Dry

## 2016-03-13 NOTE — Progress Notes (Signed)
PULMONARY / CRITICAL CARE MEDICINE PROGRESS NOTE   Name: Valerie Perez MRN: [id number] DOB: [birth year]    ADMISSION DATE:  03/11/2016 CONSULTATION DATE:  1/16  CHIEF COMPLAINT:  Arrest  SUBJECTIVE: Patient weaned well yesterday, but was not extubated as she became agitated and hypertensive. This morning patient is weaning well on 5/5 for 30-40 minutes off of sedation.   VITAL SIGNS: BP (!) 185/66   Pulse 83   Temp 98.2 F (36.8 C)   Resp (!) 8   Ht 5\' 5"  (1.651 m)   Wt 238 lb 1.6 oz (108 kg)   SpO2 100%   BMI 39.62 kg/m   HEMODYNAMICS:    VENTILATOR SETTINGS: Vent Mode: PSV;CPAP FiO2 (%):  [40 %-60 %] 50 % Set Rate:  [26 bmp] 26 bmp Vt Set:  [500 mL] 500 mL PEEP:  [5 cmH20] 5 cmH20 Pressure Support:  [5 cmH20] 5 cmH20 Plateau Pressure:  [21 cmH20-22 cmH20] 22 cmH20  INTAKE / OUTPUT: I/O last 3 completed shifts: In: 2304 [I.V.:1544; NG/GT:110; IV Piggyback:650] Out: 4093 [Emesis/NG output:375; Other:3718]  PHYSICAL EXAMINATION: General:  Morbidly obese female in no acute distress on the ventilator Neuro: RASS 0, moves all extremities, follows commands HEENT:  Normocephalic atraumatic, intubated Cardiovascular:  Regular rate and rhythm Lungs:  Clear bilateral breath sounds Abdomen:  Soft, nondistended, wound on abdomen with dressings in place  Ext: Trace lower extremity edema bilaterally  LABS:  BMET  Recent Labs Lab 03/12/16 0430 03/12/16 1545 03/13/16 0415  NA 138 139 138  K 5.1 4.6 4.5  CL 100* 103 103  CO2 25 28 25   BUN 59* 41* 31*  CREATININE 5.93* 4.49* 3.43*  GLUCOSE 60* 81 67    Electrolytes  Recent Labs Lab 03/12/16 0430 03/12/16 1545 03/13/16 0415  CALCIUM 8.2* 7.8* 7.9*  MG 2.5*  --  2.3  PHOS 7.1* 6.5* 4.8*    CBC  Recent Labs Lab 03/11/16 0850  03/11/16 1931 03/12/16 0430 03/13/16 0415  WBC 16.9*  --   --  11.1* 10.6*  HGB 9.9*  < > 11.2* 9.2* 8.9*  HCT 32.1*  < > 33.0* 29.0* 28.9*  PLT 263  --   --  208 174  <  > = values in this interval not displayed.  Coag's  Recent Labs Lab 03/11/16 1045  APTT 33  INR 1.45    Sepsis Markers  Recent Labs Lab 03/11/16 0850 03/11/16 0900 03/11/16 1146 03/12/16 0430 03/13/16 0415  LATICACIDVEN  --  7.49* 1.64  --   --   PROCALCITON 0.13  --   --  25.42 18.04    ABG  Recent Labs Lab 03/11/16 1009 03/11/16 1130 03/12/16 0347  PHART 7.223* 7.363 7.477*  PCO2ART 51.0* 38.9 38.8  PO2ART 84.0 77.0* 120.0*    Liver Enzymes  Recent Labs Lab 03/12/16 0430 03/12/16 1545 03/13/16 0415  ALBUMIN 2.7* 2.6* 2.5*    Cardiac Enzymes  Recent Labs Lab 03/11/16 1630 03/12/16 0005 03/12/16 0430  TROPONINI 0.53* 0.56* 0.50*    Glucose  Recent Labs Lab 03/12/16 1933 03/13/16 0025 03/13/16 0420 03/13/16 0541 03/13/16 0806 03/13/16 0825  GLUCAP 83 78 73 72 69 115*    Imaging No results found. STUDIES:  CT head 1/16 >Mild brain atrophy.  No acute intracranial process. Scattered sinus mucosal thickening and numerous small air-fluid levels suggesting sinusitis. CXR 1/17 > Tube and catheter positions as described without pneumothorax. Slightly less edema compared to 1 day prior. There are pleural effusions bilaterally  as well as cardiomegaly and pulmonary venous hypertension consistent with a degree of persistent congestive heart failure. No new opacity.  CULTURES: BCx2 1/16 > NGTD  ANTIBIOTICS: Zosyn 1/16 >> Vancomycin 1/16 >>  SIGNIFICANT EVENTS: 1/16 Cardiac arrest  LINES/TUBES: Right IJ tunneled dialysis catheter 1/16 ETT > Left IJ 1/16 > Aline 1/16 >  DISCUSSION: 56 year old female with end-stage renal disease on dialysis suffered VF/PEA cardiac arrest for in total just over 45 minutes. Potassium found to be greater than 7.5. Admitted to the pulmonary critical care team with plans for normothermia protocol due to prolonged downtime, fluctuant abnormalities, refractory shock.  ASSESSMENT / PLAN:  PULMONARY A: Acute  hypercarbic respiratory failure Sleep apnea Pulmonary Edema ?CAP P:   Full vent support SBT, daily WUA VAP prevention bundle OFF Precedex and Fentanyl, versed prn  May be able to extubate later today CPAP QHS once extubated On Vanc/Zosyn with thick secretions ABG  CARDIOVASCULAR A:  Cardiogenic shock secondary to cardiac arrest - resolved Now Hypertensive LVH History of hypertension, hyperlipidemia P:  Telemetry monitoring Off Levophed HTN this morning 160s/90s Restarted home coreg 12.5 BID and amlodipine 10 mg QD On Imdur 30 mg QD and Hydralazine 25 mg Q8H per tube Hydralazine prn  Cardiology plans to cath later this week when more stable Normothermia   RENAL A:   ESRD on dialysis TTS Severe hyperkalemia > resolved 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 P:   Nephrology following  CRRT BMET BID ?Restart home prednisone 5 mg QD  GASTROINTESTINAL A:   No acute issues Chronic Abdominal Wound P:   NPO Renal dosed Pepcid for stress ulcer prophylaxis Start tube feeds 1/18 if not extubated Seen by wound care- dressing in place, shallow and healing, well not infected  HEMATOLOGIC A:   Anemia of chronic illness P:  Follow CBC Heparin TID  INFECTIOUS A:   Leukocytosis - WBC 16.9 >11.1>10.6, BCx NGTD, procalc 25 to 18 today, possibly due to CAP P:   On Vanc/Zosyn Follow WBC and fever curve  ENDOCRINE A:   Diabetes mellitus  Hypoglycemic P:   CBG monitoring  D50 prn   NEUROLOGIC A:   Acute anoxic encephalopathy P:   RASS goal: 0 OFF fentanyl and precedex Versed prn   FAMILY  - Updates:  Husband and daughter updated 1/17.  - Inter-disciplinary family meet or Palliative Care meeting due by:  1/22  Karlene LinemanAlexa Emon Lance, DO PGY-3 Internal Medicine Resident Pager # [phone] 03/13/2016 9:56 AM

## 2016-03-13 NOTE — Progress Notes (Signed)
Pt's husband wants it to be known that if the pt needs a stent tomorrow, they want it to be placed, but they want the bare metal stent and NOT the DES because pt is awaiting a transplant, and getting a DES will prolong the transplant for a year, where a bare metal stent should only prolong this transplant for 3 months. RN will pass this information along to night shift RN. MD Delton SeeNelson had a conversation with pt's husband related to all of this information.

## 2016-03-13 NOTE — Progress Notes (Signed)
Per verbal report from MD Lawerance BachBurns, RN goal is to try to get pt's BP to systolic of 180.

## 2016-03-13 NOTE — Procedures (Signed)
Extubation Procedure Note  Patient Details:   Name: Valerie Perez DOB: September 08, 1960 MRN: [id number]   Airway Documentation:   + air cuff leak test prior to extubation.  Evaluation  O2 sats: stable throughout Complications: No apparent complications Patient did tolerate procedure well. Bilateral Breath Sounds: Diminished, Clear   Yes, pt able to speak.  No stridor noted, no distress noted.  VSS, Sat 96% on 6 lpm [place]  Jennette KettleBrowning, Daune Colgate Joy 03/13/2016, 11:04 AM

## 2016-03-13 NOTE — Progress Notes (Signed)
Paged MD Burns. Pt's BP is still elevated in the 200/60's despite increasing nitro gtt and having given the PO hydralazine. MD ordered for RN to give the 1700 coreg early and the earlier missed imdur now. Pt does state she has a headache even after RN gave 650mg  tylenol. MD made aware of the increasing intensity of the headache. Advised pt to stay calm. Will continue to monitor pt.

## 2016-03-13 NOTE — Progress Notes (Signed)
   03/13/16 2018  BiPAP/CPAP/SIPAP  BiPAP/CPAP/SIPAP Pt Type Adult  Mask Type Full face mask  Mask Size Large  Set Rate 0 breaths/min  Respiratory Rate 15 breaths/min  IPAP 16 cmH20  EPAP 16 cmH2O  Oxygen Percent 40 %  Flow Rate 5 lpm  BiPAP/CPAP/SIPAP CPAP  Patient Home Equipment No  Auto Titrate No  BiPAP/CPAP /SiPAP Vitals  Pulse Rate 78  Resp 15  SpO2 96 %

## 2016-03-13 NOTE — Progress Notes (Signed)
ANTICOAGULATION CONSULT NOTE - Initial Consult  Pharmacy Consult for Heparin Indication: DVT  Allergies  Allergen Reactions  . Tape Other (See Comments)    Tears skin  . 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    Patient Measurements: Height: 5\' 5"  (165.1 cm) Weight: 238 lb 1.6 oz (108 kg) IBW/kg (Calculated) : 57 Heparin Dosing Weight: 84  Vital Signs: Temp: 98.5 F (36.9 C) (01/18 1148) Temp Source: Oral (01/18 1148) BP: 185/66 (01/18 0922) Pulse Rate: 83 (01/18 1500)  Labs:  Recent Labs  03/11/16 0850  03/11/16 1045  03/11/16 1630  03/11/16 1931 03/12/16 0005 03/12/16 0430 03/12/16 1545 03/13/16 0415  HGB 9.9*  < >  --   < >  --   < > 11.2*  --  9.2*  --  8.9*  HCT 32.1*  < >  --   < >  --   < > 33.0*  --  29.0*  --  28.9*  PLT 263  --   --   --   --   --   --   --  208  --  174  APTT  --   --  33  --   --   --   --   --   --   --   --   LABPROT  --   --  17.8*  --   --   --   --   --   --   --   --   INR  --   --  1.45  --   --   --   --   --   --   --   --   CREATININE 9.43*  < > 9.61*  < >  --   < > 7.60* 6.43* 5.93* 4.49* 3.43*  TROPONINI  --   < > 0.21*  --  0.53*  --   --  0.56* 0.50*  --   --   < > = values in this interval not displayed.  Estimated Creatinine Clearance: 22.1 mL/min (by C-G formula based on SCr of 3.43 mg/dL (H)).   Medical History: Past Medical History:  Diagnosis Date  . Anemia   . Arthritis    osteoarthritis  . Depression   . Diabetes mellitus without complication (HCC)    Type II  . Dyspnea    with exertion  . Fatty liver   . Heart murmur    just recently told she had a heart murmur, but had never heard this before  . High cholesterol   . Hypertension   . Hypothyroid   . Neuropathy (HCC)   . Pneumonia   . PONV (postoperative nausea and vomiting)   . Renal disorder    dialysis on Tuesday/Thursday/Saturday  . Sleep apnea    uses cpap    Assessment: 56 year old female to begin heparin  for new DVT Received sq heparin at 1400 pm With renal insufficiency  Planning cath  Goal of Therapy:  Heparin level 0.3-0.7 units/ml Monitor platelets by anticoagulation protocol: Yes   Plan:  Heparin 3000 units iv bolus x 1 Heparin drip at 1200 units / hr Heparin level 8 hours after heparin begins Daily heparin level, CBC  Thank you Okey RegalLisa Netasha Wehrli, PharmD [phone]  03/13/2016,4:03 PM

## 2016-03-13 NOTE — Progress Notes (Signed)
Previous SBP goal of 180.  Pt currently on 10mg /hr Cardene and SBP<180. MD paged and received new parameters to keep SBP between 160-180.

## 2016-03-13 NOTE — Care Management Note (Signed)
Case Management Note  Patient Details  Name: Valerie Perez MRN: [id number] Date of Birth: 09-20-1960  Subjective/Objective:         Adm w cardiac arrest           Action/Plan: lives w fam, esrd on hd   Expected Discharge Date:                  Expected Discharge Plan:  Home w Home Health Services  In-House Referral:     Discharge planning Services  CM Consult  Post Acute Care Choice:  Resumption of Svcs/PTA Provider Choice offered to:     DME Arranged:    DME Agency:     HH Arranged:  RN [facility]  Status of Service:  In process, will continue to follow  If discussed at Long Length of Stay Meetings, dates discussed:    Additional Comments: has home wound vac, act w [facility] for hhrn. Mary w [facility] called to let Valerie Perez know they follow pt.  Hanley Haysowell, Shatori Bertucci T, RN 03/13/2016, 2:06 PM

## 2016-03-13 NOTE — Progress Notes (Signed)
Patient Name: Valerie Perez Date of Encounter: 03/13/2016  Active Problems:   Cardiac arrest ([place])   Length of Stay: 2  SUBJECTIVE  Extubated at 11 am today.   CURRENT MEDS . [START ON 03/14/2016] amLODipine  10 mg Oral Daily  . atorvastatin  80 mg Oral q1800  . carvedilol  12.5 mg Oral BID WC  . chlorhexidine gluconate (MEDLINE KIT)  15 mL Mouth Rinse BID  . Chlorhexidine Gluconate Cloth  6 each Topical Q0600  . famotidine (PEPCID) IV  10 mg Intravenous Q24H  . gabapentin  300 mg Oral QHS  . heparin subcutaneous  5,000 Units Subcutaneous Q8H  . hydrALAZINE  25 mg Oral Q8H  . isosorbide mononitrate  30 mg Oral Daily  . [START ON 03/14/2016] levothyroxine  75 mcg Oral QAC breakfast  . mouth rinse  15 mL Mouth Rinse 10 times per day  . mupirocin ointment  1 application Nasal BID  . piperacillin-tazobactam  3.375 g Intravenous Q6H  . predniSONE  5 mg Oral Q breakfast  . sodium chloride flush  10-40 mL Intracatheter Q12H  . vancomycin  1,000 mg Intravenous Q24H   OBJECTIVE  Vitals:   03/13/16 1058 03/13/16 1100 03/13/16 1148 03/13/16 1200  BP:      Pulse: 87 88  86  Resp: 16 15  16   Temp:   98.5 F (36.9 C)   TempSrc:   Oral   SpO2: 99% 92%  98%  Weight:      Height:        Intake/Output Summary (Last 24 hours) at 03/13/16 1213 Last data filed at 03/13/16 1200  Gross per 24 hour  Intake          1433.83 ml  Output             3127 ml  Net         -1693.17 ml   Filed Weights   03/11/16 1215 03/12/16 0500 03/13/16 0500  Weight: 252 lb 6.8 oz (114.5 kg) 242 lb 15.2 oz (110.2 kg) 238 lb 1.6 oz (108 kg)   PHYSICAL EXAM  General: Intubated Neck: Supple without bruits or JVD. Lungs:  Resp regular and unlabored, CTA. Heart: RRR no s3, s4, or murmurs. Abdomen: Soft, non-tender, non-distended, BS + x 4.  Extremities: No clubbing, cyanosis or edema. DP/PT/Radials 2+ and equal bilaterally.  Accessory Clinical Findings  CBC  Recent Labs  03/12/16 0430  03/13/16 0415  WBC 11.1* 10.6*  HGB 9.2* 8.9*  HCT 29.0* 28.9*  MCV 88.4 92.6  PLT 208 837   Basic Metabolic Panel  Recent Labs  03/12/16 0430 03/12/16 1545 03/13/16 0415  NA 138 139 138  K 5.1 4.6 4.5  CL 100* 103 103  CO2 25 28 25   GLUCOSE 60* 81 67  BUN 59* 41* 31*  CREATININE 5.93* 4.49* 3.43*  CALCIUM 8.2* 7.8* 7.9*  MG 2.5*  --  2.3  PHOS 7.1* 6.5* 4.8*   Liver Function Tests  Recent Labs  03/12/16 1545 03/13/16 0415  ALBUMIN 2.6* 2.5*   No results for input(s): LIPASE, AMYLASE in the last 72 hours. Cardiac Enzymes  Recent Labs  03/11/16 1630 03/12/16 0005 03/12/16 0430  TROPONINI 0.53* 0.56* 0.50*   BNP Invalid input(s): POCBNP D-Dimer No results for input(s): DDIMER in the last 72 hours. Hemoglobin A1C No results for input(s): HGBA1C in the last 72 hours. Fasting Lipid Panel No results for input(s): CHOL, HDL, LDLCALC, TRIG, CHOLHDL, LDLDIRECT in the  last 72 hours. Thyroid Function Tests No results for input(s): TSH, T4TOTAL, T3FREE, THYROIDAB in the last 72 hours.  Invalid input(s): FREET3  Radiology/Studies  Ct Head Wo Contrast  Result Date: 03/11/2016 CLINICAL DATA:  Dialysis patient, vomiting, recent CPR EXAM: CT HEAD WITHOUT CONTRAST TECHNIQUE: Contiguous axial images were obtained from the base of the skull through the vertex without intravenous contrast. COMPARISON:  None available FINDINGS: Brain: Mild brain atrophy without acute intracranial hemorrhage, mass lesion, definite infarction, midline shift, herniation, hydrocephalus, or extra-axial fluid collection. Normal gray-white matter differentiation. Cisterns remain patent. No cerebellar abnormality. Vascular: Intracranial atherosclerosis noted. Skull: Normal. Negative for fracture or focal lesion. Sinuses/Orbits: Orbits are symmetric. Scattered pansinus disease with small diffuse air-fluid levels can be seen with sinusitis. Other: None. IMPRESSION: Mild brain atrophy.  No acute  intracranial process. Scattered sinus mucosal thickening and numerous small air-fluid levels suggesting sinusitis. Electronically Signed   By: Jerilynn Mages.  Shick M.D.   On: 03/11/2016 12:18   Dg Chest Port 1 View  Result Date: 03/12/2016 CLINICAL DATA:  Hypoxia EXAM: PORTABLE CHEST 1 VIEW COMPARISON:  March 11, 2016 FINDINGS: Endotracheal tube tip is 3.1 cm above the carina. Central catheter tip is at the cavoatrial junction. Nasogastric tube tip and side port below the diaphragm. No pneumothorax. There are bilateral pleural effusions with mild pulmonary edema ; there is slightly less interstitial pulmonary edema compared to 1 day prior. There is stable cardiomegaly with mild pulmonary venous hypertension. No new opacity. No evident adenopathy. No bone lesions. IMPRESSION: Tube and catheter positions as described without pneumothorax. Slightly less edema compared to 1 day prior. There are pleural effusions bilaterally as well as cardiomegaly and pulmonary venous hypertension consistent with a degree of persistent congestive heart failure. No new opacity. Electronically Signed   By: Lowella Grip III M.D.   On: 03/12/2016 07:40   Dg Chest Port 1 View  Result Date: 03/11/2016 CLINICAL DATA:  Central line placement EXAM: PORTABLE CHEST 1 VIEW COMPARISON:  03/11/2016 FINDINGS: Endotracheal tube in good position. Right jugular dual lumen central venous catheter tip in the lower SVC unchanged Interval placement of left jugular central venous catheter with the tip in the mid SVC. No pneumothorax. Diffuse bilateral airspace disease shows mild interval improvement especially in the right upper lobe. There remains significant consolidation in both lung bases. No significant pleural effusion. NG tube in the stomach. IMPRESSION: Left jugular central venous catheter tip in the SVC. No pneumothorax. Diffuse bilateral airspace disease shows partial clearing in the upper lobes but remains significant in the lower lobes.  Endotracheal tube remains in good position. Electronically Signed   By: Franchot Gallo M.D.   On: 03/11/2016 17:23   Dg Chest Portable 1 View  Result Date: 03/11/2016 CLINICAL DATA:  Endotracheal tube placement.  Vomiting at home. EXAM: PORTABLE CHEST 1 VIEW COMPARISON:  04/28/2015 FINDINGS: Endotracheal tube tip below the clavicular heads. Dialysis catheter on the right with tip at the upper right atrium. Fairly symmetric widespread advanced airspace disease. No visible pleural effusion or pneumothorax. Cardiopericardial enlargement. Upper mediastinal width is distorted by leftward rotation. There could also be vascular pedicle thickening in the setting of CHF. IMPRESSION: 1. Endotracheal tube in good position. 2. Advanced airspace disease, bilaterality favoring pulmonary edema. Aspiration/pneumonia is also considered given history of vomiting. Electronically Signed   By: Monte Fantasia M.D.   On: 03/11/2016 09:05   Dg Abd Portable 1 View  Result Date: 03/11/2016 CLINICAL DATA:  Orogastric tube placement EXAM: PORTABLE ABDOMEN -  1 VIEW COMPARISON:  March 02, 2015 FINDINGS: Orogastric tube tip and side port in stomach. Visualized bowel gas pattern unremarkable. There are surgical clips in the right upper quadrant region. No free air evident. IMPRESSION: Orogastric tube tip and side port in stomach. Visualized bowel gas pattern unremarkable. Electronically Signed   By: Lowella Grip III M.D.   On: 03/11/2016 11:01   TELE: SR   ASSESSMENT AND PLAN  1. 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 However the patient has had great neurologic recovery, she is alert and follows commands. Extubated an hour ago.  The patient had a Lexiscan nuclear stress test in July 2017 as part of pre kidney transplant workup and it was positive for Moderate mid to distal anterior wall inducible perfusion defect. No evidence of fixed perfusion defect. LVEF > 70%. Her  cath was scheduled for later this month in [place]. She denies any chest pain or DOE in the past.  Max troponin 0.53 despite cardiac arrest, VF and prolonged resuscitation, LVEF on 1/16 60-65% with no regional wall motion abnormalities.  We will plan for a LHC tomorrow. If she needs a stent, she should preferably need a BMS as she is waiting for a kidney transplant and doesn't want to delay by a year.  She is hypertensive, on imdur 30 mg po daily, continue carvedilol 12.5 mg po BID, amlodipine 10 mg po daily. Unable to take po meds until cleared by speech therapy. I will start iv NTG to control BP for now. Continue Start ASA 25m po daily and atorvastatin 80 mg po daily.  Echo showed normal LVEF, no regional wall motion abnormalities.    Signed, KEna DawleyMD, [facility]1/18/2018

## 2016-03-13 NOTE — Evaluation (Signed)
Clinical/Bedside Swallow Evaluation Patient Details  Name: Valerie Perez MRN: [id number] Date of Birth: Jun 15, 1960  Today's Date: 03/13/2016 Time: SLP Start Time (ACUTE ONLY): 1342 SLP Stop Time (ACUTE ONLY): 1402 SLP Time Calculation (min) (ACUTE ONLY): 20 min  Past Medical History:  Past Medical History:  Diagnosis Date  . Anemia   . Arthritis    osteoarthritis  . Depression   . Diabetes mellitus without complication (HCC)    Type II  . Dyspnea    with exertion  . Fatty liver   . Heart murmur    just recently told she had a heart murmur, but had never heard this before  . High cholesterol   . Hypertension   . Hypothyroid   . Neuropathy (HCC)   . Pneumonia   . PONV (postoperative nausea and vomiting)   . Renal disorder    dialysis on Tuesday/Thursday/Saturday  . Sleep apnea    uses cpap   Past Surgical History:  Past Surgical History:  Procedure Laterality Date  . ABDOMINAL HYSTERECTOMY    . AV FISTULA PLACEMENT Right 10/03/2015   Procedure: CREATION-RIGHT BRACHIOCEPHALIC ARTERIOVENOUS (AV) FISTULA;  Surgeon: Fransisco Hertz, MD;  Location: [facility] OR;  Service: Vascular;  Laterality: Right;  . CHOLECYSTECTOMY  2000  . COLONOSCOPY    . DEBRIDEMENT OF ABDOMINAL WALL ABSCESS  01/14/2016  . FISTULA SUPERFICIALIZATION Right 01/30/2016   Procedure: FISTULA SUPERFICIALIZATION RIGHT UPPER ARM;  Surgeon: Fransisco Hertz, MD;  Location: [facility] OR;  Service: Vascular;  Laterality: Right;  . KIDNEY TRANSPLANT  2000   HPI:  A LEVEL 5 CAVEAT PERATAINS DUE TO CARDIAC ARREST.  Pt presents with ongoing CPR.  Per EMS when they arrived on 03/11/16, she had decreased responsiveness, was bradycardic.  In ambulance she arrested, had vfib and vtach- was defibrillated a total of 4 times.  She had one dose of epi.  She arrives with BVM, NP airway.  Pt is on dialysis and was due to go today, but family states she called to cancel as she was not feeling well; pt intubated from 03/11/16-03/13/16; BSE ordered to  r/o aspiration/determine safest diet.   Assessment / Plan / Recommendation Clinical Impression   Pt exhibited delayed throat clearing with larger volumes of thin liquids via straw, but this was eliminated given small sips via cup; other consistencies were WFL during BSE as swallow was noted to be timely and oral/pharyngeal clearance appeared adequate; pt's vocal quality improved significantly after po intake as quality was noted to be breathy with periods of aphonia initially; pt c/o xerostomia upon SLP arrival; recommend Regular/thin liquid diet with medications whole in puree for safety purposes and pt preference; ST will f/u while in house for diet tolerance and pt/family education.    Aspiration Risk  Mild aspiration risk    Diet Recommendation   Regular/thin liquids  Medication Administration: Whole meds with puree    Other  Recommendations Oral Care Recommendations: Oral care QID   Follow up Recommendations Other (comment) (TBD)      Frequency and Duration min 2x/week  1 week       Prognosis Prognosis for Safe Diet Advancement: Good      Swallow Study   General Date of Onset: 03/11/16 HPI: A LEVEL 5 CAVEAT PERATAINS DUE TO CARDIAC ARREST.  Pt presents with ongoing CPR.  Per EMS when they arrived she had decreased responsiveness, was bradycardic.  In ambulance she arrested, had vfib and vtach- was defibrillated a total of 4 times.  She had one dose of epi.  She arrives with BVM, NP airway.  Pt is on dialysis and was due to go today, but family states she called to cancel as she was not feeling well.   Type of Study: Bedside Swallow Evaluation Previous Swallow Assessment:  (n/a) Diet Prior to this Study: NPO Temperature Spikes Noted: No Respiratory Status: Room air History of Recent Intubation: Yes Length of Intubations (days): 2 days Date extubated: 03/13/16 Behavior/Cognition: Alert;Cooperative Oral Cavity Assessment: Within Functional Limits Oral Care Completed by SLP:  Recent completion by staff Oral Cavity - Dentition: Adequate natural dentition Vision: Functional for self-feeding Self-Feeding Abilities: Needs assist;Able to feed self Patient Positioning: Upright in bed Baseline Vocal Quality: Breathy;Low vocal intensity;Other (comment) (improved as BSE progressed) Volitional Cough: Strong Volitional Swallow: Able to elicit    Oral/Motor/Sensory Function Overall Oral Motor/Sensory Function: Within functional limits   Ice Chips Ice chips: Within functional limits Presentation: Spoon   Thin Liquid Thin Liquid: Impaired Presentation: Cup;Spoon;Straw Pharyngeal  Phase Impairments: Throat Clearing - Delayed;Other (comments) (only with larger volumes (straw))    Nectar Thick Nectar Thick Liquid: Not tested   Honey Thick Honey Thick Liquid: Not tested   Puree Puree: Within functional limits Presentation: Spoon   Solid      Solid: Within functional limits Presentation: Spoon    Functional Assessment Tool Used: NOMS Functional Limitations: Swallowing Swallow Current Status ([id number]): At least 1 percent but less than 20 percent impaired, limited or restricted Swallow Goal Status [phone]): At least 1 percent but less than 20 percent impaired, limited or restricted   Everlina Gotts,PAT, M.S., CCC-SLP 03/13/2016,2:24 PM

## 2016-03-13 NOTE — Progress Notes (Signed)
*  PRELIMINARY RESULTS* Vascular Ultrasound Lower extremity venous duplex has been completed.  Preliminary findings: DVT noted in the Right popliteal and posterior tibial veins. No DVT LLE.  Gave results to MuskegonMegan, RN    Farrel DemarkJill Eunice, RDMS, RVT  03/13/2016, 3:30 PM

## 2016-03-13 NOTE — Progress Notes (Signed)
Hypoglycemic Event  CBG: 69  Treatment: D50 IV 25 mL  Symptoms: None  Follow-up CBG: Time:0826 CBG Result:115  Possible Reasons for Event: Inadequate meal intake    Abbott PaoBailey, Montrae Braithwaite N

## 2016-03-13 NOTE — Progress Notes (Signed)
Patient successfully extubated at 1100. No stridor, tachycardia or increased work of breathing following extubation. Patient satting well on 6L [place]. Will continue to monitor.   Karlene LinemanAlexa Burns, DO PGY-3 Internal Medicine Resident Pager # [phone] 03/13/2016 11:08 AM

## 2016-03-14 ENCOUNTER — Encounter (HOSPITAL_COMMUNITY)
Admission: EM | Disposition: A | Discharge status: Home Health | Payer: Self-pay | Source: Home / Self Care | Attending: Pulmonary Disease

## 2016-03-14 DIAGNOSIS — R778 Other specified abnormalities of plasma proteins: Secondary | ICD-10-CM | POA: Diagnosis present

## 2016-03-14 DIAGNOSIS — R7989 Other specified abnormal findings of blood chemistry: Secondary | ICD-10-CM

## 2016-03-14 DIAGNOSIS — R748 Abnormal levels of other serum enzymes: Secondary | ICD-10-CM

## 2016-03-14 DIAGNOSIS — J9601 Acute respiratory failure with hypoxia: Secondary | ICD-10-CM

## 2016-03-14 DIAGNOSIS — Z452 Encounter for adjustment and management of vascular access device: Secondary | ICD-10-CM

## 2016-03-14 HISTORY — PX: CARDIAC CATHETERIZATION: SHX172

## 2016-03-14 LAB — CBC
HEMATOCRIT: 29.5 % — AB (ref 36.0–46.0)
HEMOGLOBIN: 8.9 g/dL — AB (ref 12.0–15.0)
MCH: 27.8 pg (ref 26.0–34.0)
MCHC: 30.2 g/dL (ref 30.0–36.0)
MCV: 92.2 fL (ref 78.0–100.0)
Platelets: 181 10*3/uL (ref 150–400)
RBC: 3.2 MIL/uL — ABNORMAL LOW (ref 3.87–5.11)
RDW: 18.9 % — ABNORMAL HIGH (ref 11.5–15.5)
WBC: 9.1 10*3/uL (ref 4.0–10.5)

## 2016-03-14 LAB — RENAL FUNCTION PANEL
ALBUMIN: 2.8 g/dL — AB (ref 3.5–5.0)
ANION GAP: 10 (ref 5–15)
Albumin: 2.7 g/dL — ABNORMAL LOW (ref 3.5–5.0)
Anion gap: 7 (ref 5–15)
BUN: 19 mg/dL (ref 6–20)
BUN: 17 mg/dL (ref 6–20)
CO2: 27 mmol/L (ref 22–32)
CO2: 25 mmol/L (ref 22–32)
Calcium: 8.1 mg/dL — ABNORMAL LOW (ref 8.9–10.3)
Calcium: 8.6 mg/dL — ABNORMAL LOW (ref 8.9–10.3)
Chloride: 104 mmol/L (ref 101–111)
Chloride: 103 mmol/L (ref 101–111)
Creatinine, Ser: 2.4 mg/dL — ABNORMAL HIGH (ref 0.44–1.00)
Creatinine, Ser: 2.43 mg/dL — ABNORMAL HIGH (ref 0.44–1.00)
GFR calc Af Amer: 24 mL/min — ABNORMAL LOW (ref >60)
GFR calc non Af Amer: 21 mL/min — ABNORMAL LOW (ref >60)
GFR, EST AFRICAN AMERICAN: 25 mL/min — AB (ref >60)
GFR, EST NON AFRICAN AMERICAN: 21 mL/min — AB (ref >60)
Glucose, Bld: 117 mg/dL — ABNORMAL HIGH (ref 65–99)
Glucose, Bld: 118 mg/dL — ABNORMAL HIGH (ref 65–99)
PHOSPHORUS: 4.2 mg/dL (ref 2.5–4.6)
POTASSIUM: 4.4 mmol/L (ref 3.5–5.1)
Phosphorus: 3.8 mg/dL (ref 2.5–4.6)
Potassium: 4.5 mmol/L (ref 3.5–5.1)
Sodium: 138 mmol/L (ref 135–145)
Sodium: 138 mmol/L (ref 135–145)

## 2016-03-14 LAB — HEPARIN LEVEL (UNFRACTIONATED)
Heparin Unfractionated: 0.13 IU/mL — ABNORMAL LOW (ref 0.30–0.70)
Heparin Unfractionated: <0.1 IU/mL — ABNORMAL LOW (ref 0.30–0.70)

## 2016-03-14 LAB — GLUCOSE, CAPILLARY
GLUCOSE-CAPILLARY: 121 mg/dL — AB (ref 65–99)
Glucose-Capillary: 111 mg/dL — ABNORMAL HIGH (ref 65–99)
Glucose-Capillary: 115 mg/dL — ABNORMAL HIGH (ref 65–99)
Glucose-Capillary: 87 mg/dL (ref 65–99)

## 2016-03-14 LAB — MAGNESIUM: MAGNESIUM: 2.6 mg/dL — AB (ref 1.7–2.4)

## 2016-03-14 SURGERY — LEFT HEART CATH AND CORONARY ANGIOGRAPHY
Anesthesia: LOCAL

## 2016-03-14 MED ORDER — SODIUM CHLORIDE 0.9 % IV SOLN: 250.0000 mL | INTRAVENOUS | Status: DC | PRN

## 2016-03-14 MED ORDER — ASPIRIN 81 MG PO CHEW
81.0000 mg | CHEWABLE_TABLET | ORAL | Status: AC
  Administered 2016-03-14: 81 mg via ORAL
  Filled 2016-03-14: qty 1

## 2016-03-14 MED ORDER — CARVEDILOL 25 MG PO TABS
25.0000 mg | ORAL_TABLET | Freq: Two times a day (BID) | ORAL | Status: DC
  Administered 2016-03-14 – 2016-03-17 (×6): 25 mg via ORAL
  Filled 2016-03-14 (×6): qty 1

## 2016-03-14 MED ORDER — SODIUM CHLORIDE 0.9% FLUSH
3.0000 mL | Freq: Two times a day (BID) | INTRAVENOUS | Status: DC
  Administered 2016-03-14 – 2016-03-18 (×6): 3 mL via INTRAVENOUS

## 2016-03-14 MED ORDER — SODIUM CHLORIDE 0.9 % WEIGHT BASED INFUSION: 1.0000 mL/kg/h | INTRAVENOUS | Status: DC

## 2016-03-14 MED ORDER — SODIUM CHLORIDE 0.9% FLUSH: 3.0000 mL | INTRAVENOUS | Status: DC | PRN

## 2016-03-14 MED ORDER — ORAL CARE MOUTH RINSE
15.0000 mL | Freq: Two times a day (BID) | OROMUCOSAL | Status: DC
  Administered 2016-03-14 – 2016-03-18 (×8): 15 mL via OROMUCOSAL

## 2016-03-14 MED ORDER — LIDOCAINE HCL (PF) 1 % IJ SOLN
INTRAMUSCULAR | Status: AC
  Filled 2016-03-14: qty 30

## 2016-03-14 MED ORDER — SODIUM CHLORIDE 0.9 % IV SOLN: INTRAVENOUS | Status: DC

## 2016-03-14 MED ORDER — HYDRALAZINE HCL 50 MG PO TABS
50.0000 mg | ORAL_TABLET | Freq: Three times a day (TID) | ORAL | Status: DC
  Administered 2016-03-14 – 2016-03-15 (×3): 50 mg via ORAL
  Filled 2016-03-14 (×3): qty 1

## 2016-03-14 MED ORDER — SODIUM CHLORIDE 0.9% FLUSH: 3.0000 mL | Freq: Two times a day (BID) | INTRAVENOUS | Status: DC

## 2016-03-14 MED ORDER — FENTANYL CITRATE (PF) 100 MCG/2ML IJ SOLN
INTRAMUSCULAR | Status: DC | PRN
  Administered 2016-03-14: 25 ug via INTRAVENOUS

## 2016-03-14 MED ORDER — HEPARIN (PORCINE) IN NACL 2-0.9 UNIT/ML-% IJ SOLN
INTRAMUSCULAR | Status: DC | PRN
  Administered 2016-03-14: 1000 mL

## 2016-03-14 MED ORDER — MIDAZOLAM HCL 2 MG/2ML IJ SOLN
INTRAMUSCULAR | Status: AC
  Filled 2016-03-14: qty 2

## 2016-03-14 MED ORDER — HEPARIN (PORCINE) IN NACL 100-0.45 UNIT/ML-% IJ SOLN
2200.0000 [IU]/h | INTRAMUSCULAR | Status: DC
  Administered 2016-03-15: 1600 [IU]/h via INTRAVENOUS
  Administered 2016-03-15 – 2016-03-18 (×4): 1700 [IU]/h via INTRAVENOUS
  Administered 2016-03-19: 1900 [IU]/h via INTRAVENOUS
  Administered 2016-03-19: 1700 [IU]/h via INTRAVENOUS
  Administered 2016-03-20 – 2016-03-23 (×7): 2200 [IU]/h via INTRAVENOUS
  Filled 2016-03-14 (×16): qty 250

## 2016-03-14 MED ORDER — MIDAZOLAM HCL 2 MG/2ML IJ SOLN
INTRAMUSCULAR | Status: DC | PRN
  Administered 2016-03-14: 1 mg via INTRAVENOUS

## 2016-03-14 MED ORDER — FENTANYL CITRATE (PF) 100 MCG/2ML IJ SOLN
INTRAMUSCULAR | Status: AC
  Filled 2016-03-14: qty 2

## 2016-03-14 MED ORDER — IOPAMIDOL (ISOVUE-370) INJECTION 76%
INTRAVENOUS | Status: AC
Start: 2016-03-14 — End: 2016-03-14
  Filled 2016-03-14: qty 100

## 2016-03-14 MED ORDER — LIDOCAINE HCL (PF) 1 % IJ SOLN
INTRAMUSCULAR | Status: DC | PRN
  Administered 2016-03-14: 8 mL

## 2016-03-14 MED ORDER — IOPAMIDOL (ISOVUE-370) INJECTION 76%
INTRAVENOUS | Status: DC | PRN
  Administered 2016-03-14: 65 mL via INTRA_ARTERIAL

## 2016-03-14 MED ORDER — HEPARIN BOLUS VIA INFUSION
2000.0000 [IU] | Freq: Once | INTRAVENOUS | Status: AC
  Administered 2016-03-14: 2000 [IU] via INTRAVENOUS
  Filled 2016-03-14: qty 2000

## 2016-03-14 MED ORDER — AMOXICILLIN-POT CLAVULANATE 500-125 MG PO TABS
1.0000 | ORAL_TABLET | Freq: Every day | ORAL | Status: AC
  Administered 2016-03-14 – 2016-03-17 (×4): 500 mg via ORAL
  Filled 2016-03-14 (×5): qty 1

## 2016-03-14 MED ORDER — SODIUM CHLORIDE 0.9% FLUSH
3.0000 mL | Freq: Two times a day (BID) | INTRAVENOUS | Status: DC
  Administered 2016-03-14: 3 mL via INTRAVENOUS

## 2016-03-14 MED ORDER — HALOPERIDOL LACTATE 5 MG/ML IJ SOLN: 5.0000 mg | Freq: Once | INTRAMUSCULAR | Status: DC

## 2016-03-14 MED ORDER — SODIUM CHLORIDE 0.9 % WEIGHT BASED INFUSION: 3.0000 mL/kg/h | INTRAVENOUS | Status: DC

## 2016-03-14 MED ORDER — HEPARIN (PORCINE) IN NACL 2-0.9 UNIT/ML-% IJ SOLN
INTRAMUSCULAR | Status: AC
  Filled 2016-03-14: qty 1000

## 2016-03-14 SURGICAL SUPPLY — 9 items
CATH INFINITI 5FR MULTPACK ANG (CATHETERS) ×2 IMPLANT
CATH LAUNCHER 5F EBU3.5 (CATHETERS) ×2 IMPLANT
KIT HEART LEFT (KITS) ×2 IMPLANT
PACK CARDIAC CATHETERIZATION (CUSTOM PROCEDURE TRAY) ×2 IMPLANT
SET INTRODUCER MICROPUNCT 5F (INTRODUCER) ×2 IMPLANT
SHEATH PINNACLE 5F 10CM (SHEATH) ×2 IMPLANT
TRANSDUCER W/STOPCOCK (MISCELLANEOUS) ×2 IMPLANT
TUBING CIL FLEX 10 FLL-RA (TUBING) IMPLANT
WIRE EMERALD 3MM-J .035X150CM (WIRE) ×2 IMPLANT

## 2016-03-14 NOTE — Progress Notes (Signed)
ANTICOAGULATION CONSULT NOTE - Follow Up Consult  Pharmacy Consult for Heparin  Indication: DVT, new onset, RLE  Allergies  Allergen Reactions  . Tape Other (See Comments)    Tears skin  . 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    Patient Measurements: Height: 5\' 5"  (165.1 cm) Weight: 245 lb 6 oz (111.3 kg) IBW/kg (Calculated) : 57  Vital Signs: Temp: 97.6 F (36.4 C) (01/19 1200) Temp Source: Oral (01/19 1200) Pulse Rate: 73 (01/19 1515)  Labs:  Recent Labs  03/12/16 0005 03/12/16 0430  03/13/16 0415 03/13/16 1550 03/14/16 0030 03/14/16 0328 03/14/16 0329 03/14/16 1335  HGB  --  9.2*  --  8.9*  --   --   --  8.9*  --   HCT  --  29.0*  --  28.9*  --   --   --  29.5*  --   PLT  --  208  --  174  --   --   --  181  --   HEPARINUNFRC  --   --   --   --   --  0.13*  --   --  <0.10*  CREATININE 6.43* 5.93*  < > 3.43* 2.90*  --  2.40*  --   --   TROPONINI 0.56* 0.50*  --   --   --   --   --   --   --   < > = values in this interval not displayed.  Estimated Creatinine Clearance: 32.1 mL/min (by C-G formula based on SCr of 2.4 mg/dL (H)).  Assessment: Heparin for new onset RLE DVT Now s/p cath and heparin to resume 8 hours pos sheath removal at 1700 pm  Goal of Therapy:  Heparin level 0.3-0.7 units/ml Monitor platelets by anticoagulation protocol: Yes   Plan: Heparin at 1600 units / hr starting at 3 am Heparin level at 11 am Daily heparin level, CBC  Thank you Okey RegalLisa Lileigh Fahringer, PharmD [phone]  -03/14/2016,5:09 PM

## 2016-03-14 NOTE — Progress Notes (Signed)
Nutrition Follow-up  DOCUMENTATION CODES:   Morbid obesity  INTERVENTION:   Supplement diet once advanced to meet calorie and protein goals.   NUTRITION DIAGNOSIS:   Increased nutrient needs related to wound healing (dialysis) as evidenced by estimated needs. Ongoing.   GOAL:   Patient will meet greater than or equal to 90% of their needs Not met.   MONITOR:   Diet advancement, Skin, I & O's  ASSESSMENT:   56 year old female with end-stage renal disease on dialysis suffered VF/PEA cardiac arrest for in total just over 45 minutes. Potassium found to be greater than 7.5. Admitted to the pulmonary critical care team with plans for normothermia protocol due to prolonged downtime, fluctuant abnormalities, refractory shock.  Extubated Remains on CRRT Pt in cath lab at time of visit, therefore NPO  Diet Order:  Diet NPO time specified Except for: Sips with Meds  Skin:  Wound (see comment) (Abdominal wound)  Last BM:  unknown  Height:   Ht Readings from Last 1 Encounters:  03/11/16 _0  (1.651 m)    Weight:   Wt Readings from Last 1 Encounters:  03/14/16 245 lb 6 oz (111.3 kg)    Ideal Body Weight:  56.8 kg  BMI:  Body mass index is 40.83 kg/m.  Estimated Nutritional Needs:   Kcal:  2000-2200  Protein:  125-145 grams  Fluid:  1.2 Ld/ay  EDUCATION NEEDS:   No education needs identified at this time  Van, [place], [place] Pager [phone] After Hours Pager

## 2016-03-14 NOTE — Progress Notes (Signed)
ANTICOAGULATION CONSULT NOTE - Follow Up Consult  Pharmacy Consult for Heparin  Indication: DVT, new onset, RLE  Allergies  Allergen Reactions  . Tape Other (See Comments)    Tears skin  . 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    Patient Measurements: Height: 5\' 5"  (165.1 cm) Weight: 238 lb 1.6 oz (108 kg) IBW/kg (Calculated) : 57  Vital Signs: Temp: 98 F (36.7 C) (01/19 0000) Temp Source: Axillary (01/19 0000) BP: 180/69 (01/18 1741) Pulse Rate: 71 (01/19 0200)  Labs:  Recent Labs  03/11/16 0850  03/11/16 1045  03/11/16 1630  03/11/16 1931 03/12/16 0005 03/12/16 0430 03/12/16 1545 03/13/16 0415 03/13/16 1550 03/14/16 0030  HGB 9.9*  < >  --   < >  --   < > 11.2*  --  9.2*  --  8.9*  --   --   HCT 32.1*  < >  --   < >  --   < > 33.0*  --  29.0*  --  28.9*  --   --   PLT 263  --   --   --   --   --   --   --  208  --  174  --   --   APTT  --   --  33  --   --   --   --   --   --   --   --   --   --   LABPROT  --   --  17.8*  --   --   --   --   --   --   --   --   --   --   INR  --   --  1.45  --   --   --   --   --   --   --   --   --   --   HEPARINUNFRC  --   --   --   --   --   --   --   --   --   --   --   --  0.13*  CREATININE 9.43*  < > 9.61*  < >  --   < > 7.60* 6.43* 5.93* 4.49* 3.43* 2.90*  --   TROPONINI  --   < > 0.21*  --  0.53*  --   --  0.56* 0.50*  --   --   --   --   < > = values in this interval not displayed.  Estimated Creatinine Clearance: 26.2 mL/min (by C-G formula based on SCr of 2.9 mg/dL (H)).  Assessment: Heparin for new onset RLE DVT, initial heparin level is low at 0.13, no issues per RN.   Goal of Therapy:  Heparin level 0.3-0.7 units/ml Monitor platelets by anticoagulation protocol: Yes   Plan:  -Heparin 2000 units BOLUS -Inc heparin drip to 1350 units/hr -1100 HL  Shanoah Asbill 03/14/2016,2:14 AM

## 2016-03-14 NOTE — Progress Notes (Signed)
SLP Cancellation Note  Patient Details Name: Renella CunasKatherine C Bair MRN: [id number] DOB: 1960-11-19   Cancelled treatment:       Reason Eval/Treat Not Completed: Other (comment) (NPO for procedure)   Blenda MountsCouture, Beverlee Wilmarth Laurice 03/14/2016, 11:26 AM

## 2016-03-14 NOTE — Care Management Important Message (Signed)
Important Message  Patient Details  Name: Valerie Perez MRN: [id number] Date of Birth: 04/23/1960   Medicare Important Message Given:  Yes    Hanley Haysowell, Sherryl Valido T, RN 03/14/2016, 9:13 AM

## 2016-03-14 NOTE — Progress Notes (Signed)
Patient not placed on CPAP at this time per RN request. Patient had just fallen asleep after pulling central line out. RT will continue to monitor as needed.

## 2016-03-14 NOTE — Interval H&P Note (Signed)
History and Physical Interval Note:  03/14/2016 4:08 PM  Valerie Perez  has presented today for cardiac catheterization, with the diagnosis of cardiac arrest. The various methods of treatment have been discussed with the patient and family. After consideration of risks, benefits and other options for treatment, the patient has consented to  Procedure(s): Left Heart Cath and Coronary Angiography (N/A) as a surgical intervention .  The patient's history has been reviewed, patient examined, no change in status, stable for surgery.  I have reviewed the patient's chart and labs.  Questions were answered to the patient's satisfaction.    Cath Lab Visit (complete for each Cath Lab visit)  Clinical Evaluation Leading to the Procedure:   ACS: Yes.   (cardiac arrest and NSTEMI)  Non-ACS:  N/A  Cristal Deerhristopher Nakeia Calvi

## 2016-03-14 NOTE — Progress Notes (Signed)
PULMONARY / CRITICAL CARE MEDICINE PROGRESS NOTE   Name: Valerie Perez MRN: [id number] DOB: Jan 11, 1961    ADMISSION DATE:  03/11/2016 CONSULTATION DATE:  1/16  CHIEF COMPLAINT:  Arrest  SUBJECTIVE: Extubated successfully yesterday. Found to have a right popliteal DVT and now on heparin gtt. On Cardene drip for HTN. Plans for LHC today. Complains of right leg pain and mild inspiratory chest pain.   VITAL SIGNS: BP (!) 180/69   Pulse 68   Temp 97.7 F (36.5 C) (Oral)   Resp 15   Ht 5\' 5"  (1.651 m)   Wt 245 lb 6 oz (111.3 kg)   SpO2 92%   BMI 40.83 kg/m   HEMODYNAMICS:    VENTILATOR SETTINGS:  INTAKE / OUTPUT: I/O last 3 completed shifts: In: 2717.3 [I.V.:2067.3; Other:55; NG/GT:70; IV Piggyback:525] Out: 6474 [Emesis/NG output:75; Other:6399]  PHYSICAL EXAMINATION: General:  Morbidly obese female in no acute distress Neuro: AAOx3 HEENT:  Normocephalic atraumatic Cardiovascular:  Regular rate and rhythm Lungs:  Clear bilateral breath sounds Abdomen:  Soft, nondistended, wound on abdomen with dressings in place  Ext: RLE with 2+ pitting edema and warm, LLE trace pitting edema  LABS:  BMET  Recent Labs Lab 03/13/16 0415 03/13/16 1550 03/14/16 0328  NA 138 138 138  K 4.5 4.6 4.4  CL 103 102 104  CO2 25 25 27   BUN 31* 23* 19  CREATININE 3.43* 2.90* 2.40*  GLUCOSE 67 117* 117*    Electrolytes  Recent Labs Lab 03/12/16 0430  03/13/16 0415 03/13/16 1550 03/14/16 0328 03/14/16 0329  CALCIUM 8.2*  < > 7.9* 8.2* 8.1*  --   MG 2.5*  --  2.3  --   --  2.6*  PHOS 7.1*  < > 4.8* 4.6 3.8  --   < > = values in this interval not displayed.  CBC  Recent Labs Lab 03/12/16 0430 03/13/16 0415 03/14/16 0329  WBC 11.1* 10.6* 9.1  HGB 9.2* 8.9* 8.9*  HCT 29.0* 28.9* 29.5*  PLT 208 174 181    Coag's  Recent Labs Lab 03/11/16 1045  APTT 33  INR 1.45    Sepsis Markers  Recent Labs Lab 03/11/16 0850 03/11/16 0900 03/11/16 1146 03/12/16 0430  03/13/16 0415  LATICACIDVEN  --  7.49* 1.64  --   --   PROCALCITON 0.13  --   --  25.42 18.04    ABG  Recent Labs Lab 03/11/16 1130 03/12/16 0347 03/13/16 1005  PHART 7.363 7.477* 7.462*  PCO2ART 38.9 38.8 37.8  PO2ART 77.0* 120.0* 192*    Liver Enzymes  Recent Labs Lab 03/13/16 0415 03/13/16 1550 03/14/16 0328  ALBUMIN 2.5* 2.6* 2.7*    Cardiac Enzymes  Recent Labs Lab 03/11/16 1630 03/12/16 0005 03/12/16 0430  TROPONINI 0.53* 0.56* 0.50*    Glucose  Recent Labs Lab 03/13/16 1147 03/13/16 1622 03/13/16 2045 03/14/16 0029 03/14/16 0303 03/14/16 0827  GLUCAP 98 129* 177* 111* 115* 87    Imaging Dg Chest Port 1 View  Result Date: 03/13/2016 CLINICAL DATA:  Cardiac arrest. Difficulty breathing. History of diabetes and hypertension. EXAM: PORTABLE CHEST 1 VIEW COMPARISON:  03/12/2016 FINDINGS: There is a left IJ catheter with tip in the projection of the SVC. There is a right subclavian dialysis catheter with tips in the projection of the cavoatrial junction. Moderate cardiac enlargement. Pulmonary vascular congestion identified. IMPRESSION: 1. Cardiac enlargement and pulmonary vascular congestion. Electronically Signed   By: Signa Kell M.D.   On: 03/13/2016 13:15  Dg Knee Right Port  Result Date: 03/13/2016 CLINICAL DATA:  Pt with right knee and lower leg pain today with bruising after cardiac arrest yesterday. Pt woke today complaining of pain. Lateral right tibia images included on right knee. EXAM: PORTABLE RIGHT KNEE - 1-2 VIEW COMPARISON:  None. FINDINGS: No fracture of the proximal tibia or distal femur. Patella is normal. No joint effusion. IMPRESSION: No fracture or dislocation. Electronically Signed   By: Genevive Bi M.D.   On: 03/13/2016 13:10   Dg Tibia/fibula Right Port  Result Date: 03/13/2016 CLINICAL DATA:  right knee and lower leg pain EXAM: PORTABLE RIGHT TIBIA AND FIBULA - 2 VIEW COMPARISON:  None. FINDINGS: There is mild diffuse  soft tissue swelling. There is no underlying fracture or subluxation. Mild degenerative changes are noted within the right knee. IMPRESSION: 1. No acute bone abnormality. 2. Soft tissue swelling. Electronically Signed   By: Signa Kell M.D.   On: 03/13/2016 13:14   STUDIES:  CT head 1/16 >Mild brain atrophy.  No acute intracranial process. Scattered sinus mucosal thickening and numerous small air-fluid levels suggesting sinusitis. CXR 1/17 > Tube and catheter positions as described without pneumothorax. Slightly less edema compared to 1 day prior. There are pleural effusions bilaterally as well as cardiomegaly and pulmonary venous hypertension consistent with a degree of persistent congestive heart failure. No new opacity.  CULTURES: BCx2 1/16 > NGTD  ANTIBIOTICS: Zosyn 1/16 >> Vancomycin 1/16 >>  SIGNIFICANT EVENTS: 1/16 Cardiac arrest  LINES/TUBES: Right IJ tunneled dialysis catheter 1/16 ETT >1/18 Left IJ 1/16 > Aline 1/16 >  DISCUSSION: 56 year old female with end-stage renal disease on dialysis suffered VF/PEA cardiac arrest for in total just over 45 minutes. Potassium found to be greater than 7.5. Admitted to the pulmonary critical care team with plans for normothermia protocol due to prolonged downtime, fluctuant abnormalities, refractory shock.  ASSESSMENT / PLAN:  PULMONARY A: Acute hypercarbic respiratory failure Sleep apnea Pulmonary Edema ?CAP P:   Extubated 1/18 CPAP QHS On Vanc/Zosyn with thick secretions>> c/s narrowing to Augmentin  CARDIOVASCULAR A:  Cardiogenic shock secondary to cardiac arrest - resolved Now Hypertensive Urgency LVH History of hypertension, hyperlipidemia P:  Telemetry monitoring HTN improved to 140s  On home coreg 12.5 BID and amlodipine 10 mg QD On Imdur 30 mg QD and Hydralazine 25 mg Q8H per tube Cardene gtt LHC today  RENAL A:   ESRD on dialysis TTS Severe hyperkalemia > resolved History of renal transplant which has  now failed, initial 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 P:   Nephrology following  CRRT > transitioning to HD BMET BID On home prednisone 5 mg QD  GASTROINTESTINAL A:   No acute issues Chronic Abdominal Wound P:   NPO for cath Renal dosed Pepcid for stress ulcer prophylaxis Seen by wound care- dressing in place, shallow and healing, well not infected  HEMATOLOGIC A:   Anemia of chronic illness R Popliteal DVT P:  Follow CBC Heparin gtt  INFECTIOUS A:   Leukocytosis - WBC 16.9 >11.1>10.6>9.1, BCx NGTD, procalc 25 > 18, possibly due to CAP versus aspiration pneumonia P:   On Vanc/Zosyn> consider narrowing to augmentin for aspiration pneumonia Follow WBC and fever curve  ENDOCRINE A:   Diabetes mellitus  Hypoglycemic P:   CBG monitoring  D50 prn   NEUROLOGIC A:   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

## 2016-03-14 NOTE — Progress Notes (Signed)
CRRT discontinued at 1545 prior to heart catheterization. Per rounds with Dr. Hyman HopesWebb, no plans to restart CRRT post cath tonight. Will resume hemodialysis tomorrow.

## 2016-03-14 NOTE — Progress Notes (Signed)
Patient EKG leads were not reading, RN went in to check on the patient and found patient standing up at the side of the bed.   The patient's central line came out and was lying on the bed. Blood was coming out from the central line insertion site.   RN covered and held pressure on bleeding site as other RNs came in to help. Patient was situated back in the bed and was not in any sign of distress. Vital signs stable and gtt moved to peripheral IV access site.   Patient acutely confused- did not know where she was or why she was here. She stated she was trying to go somewhere but could not remember where.   [facility]-Link MD called and notified about the event. MD to order Haldol for the patient.   Called patient's husband and let him know of what had happened. Patient spoke with her husband and received reassurance from him.   Patient now in bed and asleep. No longer agitated and answers questions appropriately.   Will continue to monitor for changes.   Alexandria Shiflett E Mylinda LatinaNino Combs, [place]RN

## 2016-03-14 NOTE — H&P (View-Only) (Signed)
Patient Name: Valerie Perez Date of Encounter: 03/14/2016  Active Problems:   Cardiac arrest [facility])   Encounter for central line placement   Length of Stay: 3  SUBJECTIVE  She is feeling better today, she has pain in her RLE.   CURRENT MEDS . amLODipine  10 mg Oral Daily  . amoxicillin-clavulanate  1 tablet Oral Daily  . atorvastatin  80 mg Oral q1800  . carvedilol  25 mg Oral BID WC  . chlorhexidine gluconate (MEDLINE KIT)  15 mL Mouth Rinse BID  . Chlorhexidine Gluconate Cloth  6 each Topical Q0600  . famotidine (PEPCID) IV  10 mg Intravenous Q24H  . gabapentin  300 mg Oral QHS  . hydrALAZINE  50 mg Oral Q8H  . isosorbide mononitrate  30 mg Oral Daily  . levothyroxine  75 mcg Oral QAC breakfast  . mouth rinse  15 mL Mouth Rinse 10 times per day  . mupirocin ointment  1 application Nasal BID  . predniSONE  5 mg Oral Q breakfast  . sodium chloride flush  10-40 mL Intracatheter Q12H  . sodium chloride flush  3 mL Intravenous Q12H   OBJECTIVE  Vitals:   03/14/16 0900 03/14/16 0902 03/14/16 1000 03/14/16 1100  BP:      Pulse: 68  61 64  Resp: 15  (!) 23 13  Temp:  97.7 F (36.5 C)    TempSrc:  Oral    SpO2: 92%  (!) 85% (!) 88%  Weight:      Height:        Intake/Output Summary (Last 24 hours) at 03/14/16 1153 Last data filed at 03/14/16 1100  Gross per 24 hour  Intake          2141.98 ml  Output             5560 ml  Net         -3418.02 ml   Filed Weights   03/12/16 0500 03/13/16 0500 03/14/16 0322  Weight: 242 lb 15.2 oz (110.2 kg) 238 lb 1.6 oz (108 kg) 245 lb 6 oz (111.3 kg)   PHYSICAL EXAM  General: Intubated Neck: Supple without bruits or JVD. Lungs:  Resp regular and unlabored, CTA. Heart: RRR no s3, s4, or murmurs. Abdomen: Soft, non-tender, non-distended, BS + x 4.  Extremities: No clubbing, cyanosis or edema. DP/PT/Radials 2+ and equal bilaterally.  Accessory Clinical Findings  CBC  Recent Labs  03/13/16 0415 03/14/16 0329  WBC  10.6* 9.1  HGB 8.9* 8.9*  HCT 28.9* 29.5*  MCV 92.6 92.2  PLT 174 024   Basic Metabolic Panel  Recent Labs  03/13/16 0415 03/13/16 1550 03/14/16 0328 03/14/16 0329  NA 138 138 138  --   K 4.5 4.6 4.4  --   CL 103 102 104  --   CO2 _0 --   GLUCOSE 67 117* 117*  --   BUN 31* 23* 19  --   CREATININE 3.43* 2.90* 2.40*  --   CALCIUM 7.9* 8.2* 8.1*  --   MG 2.3  --   --  2.6*  PHOS 4.8* 4.6 3.8  --    Liver Function Tests  Recent Labs  03/13/16 1550 03/14/16 0328  ALBUMIN 2.6* 2.7*   No results for input(s): LIPASE, AMYLASE in the last 72 hours. Cardiac Enzymes  Recent Labs  03/11/16 1630 03/12/16 0005 03/12/16 0430  TROPONINI 0.53* 0.56* 0.50*   BNP Invalid input(s): POCBNP D-Dimer No results  for input(s): DDIMER in the last 72 hours. Hemoglobin A1C No results for input(s): HGBA1C in the last 72 hours. Fasting Lipid Panel No results for input(s): CHOL, HDL, LDLCALC, TRIG, CHOLHDL, LDLDIRECT in the last 72 hours. Thyroid Function Tests No results for input(s): TSH, T4TOTAL, T3FREE, THYROIDAB in the last 72 hours.  Invalid input(s): FREET3  Radiology/Studies  Ct Head Wo Contrast  Result Date: 03/11/2016 CLINICAL DATA:  Dialysis patient, vomiting, recent CPR EXAM: CT HEAD WITHOUT CONTRAST TECHNIQUE: Contiguous axial images were obtained from the base of the skull through the vertex without intravenous contrast. COMPARISON:  None available FINDINGS: Brain: Mild brain atrophy without acute intracranial hemorrhage, mass lesion, definite infarction, midline shift, herniation, hydrocephalus, or extra-axial fluid collection. Normal gray-white matter differentiation. Cisterns remain patent. No cerebellar abnormality. Vascular: Intracranial atherosclerosis noted. Skull: Normal. Negative for fracture or focal lesion. Sinuses/Orbits: Orbits are symmetric. Scattered pansinus disease with small diffuse air-fluid levels can be seen with sinusitis. Other: None.  IMPRESSION: Mild brain atrophy.  No acute intracranial process. Scattered sinus mucosal thickening and numerous small air-fluid levels suggesting sinusitis. Electronically Signed   By: Jerilynn Mages.  Shick M.D.   On: 03/11/2016 12:18   Dg Chest Port 1 View  Result Date: 03/13/2016 CLINICAL DATA:  Cardiac arrest. Difficulty breathing. History of diabetes and hypertension. EXAM: PORTABLE CHEST 1 VIEW COMPARISON:  03/12/2016 FINDINGS: There is a left IJ catheter with tip in the projection of the SVC. There is a right subclavian dialysis catheter with tips in the projection of the cavoatrial junction. Moderate cardiac enlargement. Pulmonary vascular congestion identified. IMPRESSION: 1. Cardiac enlargement and pulmonary vascular congestion. Electronically Signed   By: Kerby Moors M.D.   On: 03/13/2016 13:15   Dg Chest Port 1 View  Result Date: 03/12/2016 CLINICAL DATA:  Hypoxia EXAM: PORTABLE CHEST 1 VIEW COMPARISON:  March 11, 2016 FINDINGS: Endotracheal tube tip is 3.1 cm above the carina. Central catheter tip is at the cavoatrial junction. Nasogastric tube tip and side port below the diaphragm. No pneumothorax. There are bilateral pleural effusions with mild pulmonary edema ; there is slightly less interstitial pulmonary edema compared to 1 day prior. There is stable cardiomegaly with mild pulmonary venous hypertension. No new opacity. No evident adenopathy. No bone lesions. IMPRESSION: Tube and catheter positions as described without pneumothorax. Slightly less edema compared to 1 day prior. There are pleural effusions bilaterally as well as cardiomegaly and pulmonary venous hypertension consistent with a degree of persistent congestive heart failure. No new opacity. Electronically Signed   By: Lowella Grip III M.D.   On: 03/12/2016 07:40   Dg Chest Port 1 View  Result Date: 03/11/2016 CLINICAL DATA:  Central line placement EXAM: PORTABLE CHEST 1 VIEW COMPARISON:  03/11/2016 FINDINGS: Endotracheal tube in  good position. Right jugular dual lumen central venous catheter tip in the lower SVC unchanged Interval placement of left jugular central venous catheter with the tip in the mid SVC. No pneumothorax. Diffuse bilateral airspace disease shows mild interval improvement especially in the right upper lobe. There remains significant consolidation in both lung bases. No significant pleural effusion. NG tube in the stomach. IMPRESSION: Left jugular central venous catheter tip in the SVC. No pneumothorax. Diffuse bilateral airspace disease shows partial clearing in the upper lobes but remains significant in the lower lobes. Endotracheal tube remains in good position. Electronically Signed   By: Franchot Gallo M.D.   On: 03/11/2016 17:23   Dg Chest Portable 1 View  Result Date: 03/11/2016 CLINICAL DATA:  Endotracheal tube placement.  Vomiting at home. EXAM: PORTABLE CHEST 1 VIEW COMPARISON:  04/28/2015 FINDINGS: Endotracheal tube tip below the clavicular heads. Dialysis catheter on the right with tip at the upper right atrium. Fairly symmetric widespread advanced airspace disease. No visible pleural effusion or pneumothorax. Cardiopericardial enlargement. Upper mediastinal width is distorted by leftward rotation. There could also be vascular pedicle thickening in the setting of CHF. IMPRESSION: 1. Endotracheal tube in good position. 2. Advanced airspace disease, bilaterality favoring pulmonary edema. Aspiration/pneumonia is also considered given history of vomiting. Electronically Signed   By: Monte Fantasia M.D.   On: 03/11/2016 09:05   Dg Knee Right Port  Result Date: 03/13/2016 CLINICAL DATA:  Pt with right knee and lower leg pain today with bruising after cardiac arrest yesterday. Pt woke today complaining of pain. Lateral right tibia images included on right knee. EXAM: PORTABLE RIGHT KNEE - 1-2 VIEW COMPARISON:  None. FINDINGS: No fracture of the proximal tibia or distal femur. Patella is normal. No joint  effusion. IMPRESSION: No fracture or dislocation. Electronically Signed   By: Suzy Bouchard M.D.   On: 03/13/2016 13:10   Dg Tibia/fibula Right Port  Result Date: 03/13/2016 CLINICAL DATA:  right knee and lower leg pain EXAM: PORTABLE RIGHT TIBIA AND FIBULA - 2 VIEW COMPARISON:  None. FINDINGS: There is mild diffuse soft tissue swelling. There is no underlying fracture or subluxation. Mild degenerative changes are noted within the right knee. IMPRESSION: 1. No acute bone abnormality. 2. Soft tissue swelling. Electronically Signed   By: Kerby Moors M.D.   On: 03/13/2016 13:14   Dg Abd Portable 1 View  Result Date: 03/11/2016 CLINICAL DATA:  Orogastric tube placement EXAM: PORTABLE ABDOMEN - 1 VIEW COMPARISON:  March 02, 2015 FINDINGS: Orogastric tube tip and side port in stomach. Visualized bowel gas pattern unremarkable. There are surgical clips in the right upper quadrant region. No free air evident. IMPRESSION: Orogastric tube tip and side port in stomach. Visualized bowel gas pattern unremarkable. Electronically Signed   By: Lowella Grip III M.D.   On: 03/11/2016 11:01   TELE: SR   ASSESSMENT AND PLAN  1. 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 However the patient has had great neurologic recovery, she is alert and follows commands. Extubated yesterday and continues to improve, AAO x 3.  The patient had a Lexiscan nuclear stress test in July 2017 as part of pre kidney transplant workup and it was positive for Moderate mid to distal anterior wall inducible perfusion defect. No evidence of fixed perfusion defect. LVEF > 70%. Her cath was scheduled for later this month in [place]. She denies any chest pain or DOE in the past.  Max troponin 0.53 despite cardiac arrest, VF and prolonged resuscitation, LVEF on 1/16 60-65% with no regional wall motion abnormalities.  We will plan for a LHC today. If she needs a stent, she should  preferably need a BMS as she is waiting for a kidney transplant and doesn't want to delay by a year.  She is hypertensive, on Cardene 10 mg po daily, carvedilol was increased to 25 mg po BID, amlodipine 10 mg po daily, hydralazine increased to 50 mg po TID.  Continue Start ASA 30m po daily and atorvastatin 80 mg po daily.  Echo showed normal LVEF, no regional wall motion abnormalities.    Signed, KEna DawleyMD, FMariners Hospital1/19/2018   . amLODipine  10 mg Oral Daily  . amoxicillin-clavulanate  1  tablet Oral Daily  . atorvastatin  80 mg Oral q1800  . carvedilol  25 mg Oral BID WC  . chlorhexidine gluconate (MEDLINE KIT)  15 mL Mouth Rinse BID  . Chlorhexidine Gluconate Cloth  6 each Topical Q0600  . famotidine (PEPCID) IV  10 mg Intravenous Q24H  . gabapentin  300 mg Oral QHS  . hydrALAZINE  50 mg Oral Q8H  . isosorbide mononitrate  30 mg Oral Daily  . levothyroxine  75 mcg Oral QAC breakfast  . mouth rinse  15 mL Mouth Rinse 10 times per day  . mupirocin ointment  1 application Nasal BID  . predniSONE  5 mg Oral Q breakfast  . sodium chloride flush  10-40 mL Intracatheter Q12H  . sodium chloride flush  3 mL Intravenous Q12H

## 2016-03-14 NOTE — Progress Notes (Signed)
  Patient Name: Valerie Perez Date of Encounter: 03/14/2016  Active Problems:   Cardiac arrest (HCC)   Encounter for central line placement   Length of Stay: 3  SUBJECTIVE  She is feeling better today, she has pain in her RLE.   CURRENT MEDS . amLODipine  10 mg Oral Daily  . amoxicillin-clavulanate  1 tablet Oral Daily  . atorvastatin  80 mg Oral q1800  . carvedilol  25 mg Oral BID WC  . chlorhexidine gluconate (MEDLINE KIT)  15 mL Mouth Rinse BID  . Chlorhexidine Gluconate Cloth  6 each Topical Q0600  . famotidine (PEPCID) IV  10 mg Intravenous Q24H  . gabapentin  300 mg Oral QHS  . hydrALAZINE  50 mg Oral Q8H  . isosorbide mononitrate  30 mg Oral Daily  . levothyroxine  75 mcg Oral QAC breakfast  . mouth rinse  15 mL Mouth Rinse 10 times per day  . mupirocin ointment  1 application Nasal BID  . predniSONE  5 mg Oral Q breakfast  . sodium chloride flush  10-40 mL Intracatheter Q12H  . sodium chloride flush  3 mL Intravenous Q12H   OBJECTIVE  Vitals:   03/14/16 0900 03/14/16 0902 03/14/16 1000 03/14/16 1100  BP:      Pulse: 68  61 64  Resp: 15  (!) 23 13  Temp:  97.7 F (36.5 C)    TempSrc:  Oral    SpO2: 92%  (!) 85% (!) 88%  Weight:      Height:        Intake/Output Summary (Last 24 hours) at 03/14/16 1153 Last data filed at 03/14/16 1100  Gross per 24 hour  Intake          2141.98 ml  Output             5560 ml  Net         -3418.02 ml   Filed Weights   03/12/16 0500 03/13/16 0500 03/14/16 0322  Weight: 242 lb 15.2 oz (110.2 kg) 238 lb 1.6 oz (108 kg) 245 lb 6 oz (111.3 kg)   PHYSICAL EXAM  General: Intubated Neck: Supple without bruits or JVD. Lungs:  Resp regular and unlabored, CTA. Heart: RRR no s3, s4, or murmurs. Abdomen: Soft, non-tender, non-distended, BS + x 4.  Extremities: No clubbing, cyanosis or edema. DP/PT/Radials 2+ and equal bilaterally.  Accessory Clinical Findings  CBC  Recent Labs  03/13/16 0415 03/14/16 0329  WBC  10.6* 9.1  HGB 8.9* 8.9*  HCT 28.9* 29.5*  MCV 92.6 92.2  PLT 174 181   Basic Metabolic Panel  Recent Labs  03/13/16 0415 03/13/16 1550 03/14/16 0328 03/14/16 0329  NA 138 138 138  --   K 4.5 4.6 4.4  --   CL 103 102 104  --   CO2 25 25 27  --   GLUCOSE 67 117* 117*  --   BUN 31* 23* 19  --   CREATININE 3.43* 2.90* 2.40*  --   CALCIUM 7.9* 8.2* 8.1*  --   MG 2.3  --   --  2.6*  PHOS 4.8* 4.6 3.8  --    Liver Function Tests  Recent Labs  03/13/16 1550 03/14/16 0328  ALBUMIN 2.6* 2.7*   No results for input(s): LIPASE, AMYLASE in the last 72 hours. Cardiac Enzymes  Recent Labs  03/11/16 1630 03/12/16 0005 03/12/16 0430  TROPONINI 0.53* 0.56* 0.50*   BNP Invalid input(s): POCBNP D-Dimer No results   for input(s): DDIMER in the last 72 hours. Hemoglobin A1C No results for input(s): HGBA1C in the last 72 hours. Fasting Lipid Panel No results for input(s): CHOL, HDL, LDLCALC, TRIG, CHOLHDL, LDLDIRECT in the last 72 hours. Thyroid Function Tests No results for input(s): TSH, T4TOTAL, T3FREE, THYROIDAB in the last 72 hours.  Invalid input(s): FREET3  Radiology/Studies  Ct Head Wo Contrast  Result Date: 03/11/2016 CLINICAL DATA:  Dialysis patient, vomiting, recent CPR EXAM: CT HEAD WITHOUT CONTRAST TECHNIQUE: Contiguous axial images were obtained from the base of the skull through the vertex without intravenous contrast. COMPARISON:  None available FINDINGS: Brain: Mild brain atrophy without acute intracranial hemorrhage, mass lesion, definite infarction, midline shift, herniation, hydrocephalus, or extra-axial fluid collection. Normal gray-white matter differentiation. Cisterns remain patent. No cerebellar abnormality. Vascular: Intracranial atherosclerosis noted. Skull: Normal. Negative for fracture or focal lesion. Sinuses/Orbits: Orbits are symmetric. Scattered pansinus disease with small diffuse air-fluid levels can be seen with sinusitis. Other: None.  IMPRESSION: Mild brain atrophy.  No acute intracranial process. Scattered sinus mucosal thickening and numerous small air-fluid levels suggesting sinusitis. Electronically Signed   By: M.  Shick M.D.   On: 03/11/2016 12:18   Dg Chest Port 1 View  Result Date: 03/13/2016 CLINICAL DATA:  Cardiac arrest. Difficulty breathing. History of diabetes and hypertension. EXAM: PORTABLE CHEST 1 VIEW COMPARISON:  03/12/2016 FINDINGS: There is a left IJ catheter with tip in the projection of the SVC. There is a right subclavian dialysis catheter with tips in the projection of the cavoatrial junction. Moderate cardiac enlargement. Pulmonary vascular congestion identified. IMPRESSION: 1. Cardiac enlargement and pulmonary vascular congestion. Electronically Signed   By: Taylor  Stroud M.D.   On: 03/13/2016 13:15   Dg Chest Port 1 View  Result Date: 03/12/2016 CLINICAL DATA:  Hypoxia EXAM: PORTABLE CHEST 1 VIEW COMPARISON:  March 11, 2016 FINDINGS: Endotracheal tube tip is 3.1 cm above the carina. Central catheter tip is at the cavoatrial junction. Nasogastric tube tip and side port below the diaphragm. No pneumothorax. There are bilateral pleural effusions with mild pulmonary edema ; there is slightly less interstitial pulmonary edema compared to 1 day prior. There is stable cardiomegaly with mild pulmonary venous hypertension. No new opacity. No evident adenopathy. No bone lesions. IMPRESSION: Tube and catheter positions as described without pneumothorax. Slightly less edema compared to 1 day prior. There are pleural effusions bilaterally as well as cardiomegaly and pulmonary venous hypertension consistent with a degree of persistent congestive heart failure. No new opacity. Electronically Signed   By: William  Woodruff III M.D.   On: 03/12/2016 07:40   Dg Chest Port 1 View  Result Date: 03/11/2016 CLINICAL DATA:  Central line placement EXAM: PORTABLE CHEST 1 VIEW COMPARISON:  03/11/2016 FINDINGS: Endotracheal tube in  good position. Right jugular dual lumen central venous catheter tip in the lower SVC unchanged Interval placement of left jugular central venous catheter with the tip in the mid SVC. No pneumothorax. Diffuse bilateral airspace disease shows mild interval improvement especially in the right upper lobe. There remains significant consolidation in both lung bases. No significant pleural effusion. NG tube in the stomach. IMPRESSION: Left jugular central venous catheter tip in the SVC. No pneumothorax. Diffuse bilateral airspace disease shows partial clearing in the upper lobes but remains significant in the lower lobes. Endotracheal tube remains in good position. Electronically Signed   By: Charles  Clark M.D.   On: 03/11/2016 17:23   Dg Chest Portable 1 View  Result Date: 03/11/2016 CLINICAL DATA:    Endotracheal tube placement.  Vomiting at home. EXAM: PORTABLE CHEST 1 VIEW COMPARISON:  04/28/2015 FINDINGS: Endotracheal tube tip below the clavicular heads. Dialysis catheter on the right with tip at the upper right atrium. Fairly symmetric widespread advanced airspace disease. No visible pleural effusion or pneumothorax. Cardiopericardial enlargement. Upper mediastinal width is distorted by leftward rotation. There could also be vascular pedicle thickening in the setting of CHF. IMPRESSION: 1. Endotracheal tube in good position. 2. Advanced airspace disease, bilaterality favoring pulmonary edema. Aspiration/pneumonia is also considered given history of vomiting. Electronically Signed   By: Jonathon  Watts M.D.   On: 03/11/2016 09:05   Dg Knee Right Port  Result Date: 03/13/2016 CLINICAL DATA:  Pt with right knee and lower leg pain today with bruising after cardiac arrest yesterday. Pt woke today complaining of pain. Lateral right tibia images included on right knee. EXAM: PORTABLE RIGHT KNEE - 1-2 VIEW COMPARISON:  None. FINDINGS: No fracture of the proximal tibia or distal femur. Patella is normal. No joint  effusion. IMPRESSION: No fracture or dislocation. Electronically Signed   By: Stewart  Edmunds M.D.   On: 03/13/2016 13:10   Dg Tibia/fibula Right Port  Result Date: 03/13/2016 CLINICAL DATA:  right knee and lower leg pain EXAM: PORTABLE RIGHT TIBIA AND FIBULA - 2 VIEW COMPARISON:  None. FINDINGS: There is mild diffuse soft tissue swelling. There is no underlying fracture or subluxation. Mild degenerative changes are noted within the right knee. IMPRESSION: 1. No acute bone abnormality. 2. Soft tissue swelling. Electronically Signed   By: Taylor  Stroud M.D.   On: 03/13/2016 13:14   Dg Abd Portable 1 View  Result Date: 03/11/2016 CLINICAL DATA:  Orogastric tube placement EXAM: PORTABLE ABDOMEN - 1 VIEW COMPARISON:  March 02, 2015 FINDINGS: Orogastric tube tip and side port in stomach. Visualized bowel gas pattern unremarkable. There are surgical clips in the right upper quadrant region. No free air evident. IMPRESSION: Orogastric tube tip and side port in stomach. Visualized bowel gas pattern unremarkable. Electronically Signed   By: William  Woodruff III M.D.   On: 03/11/2016 11:01   TELE: SR   ASSESSMENT AND PLAN  1. 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 However the patient has had great neurologic recovery, she is alert and follows commands. Extubated yesterday and continues to improve, AAO x 3.  The patient had a Lexiscan nuclear stress test in July 2017 as part of pre kidney transplant workup and it was positive for Moderate mid to distal anterior wall inducible perfusion defect. No evidence of fixed perfusion defect. LVEF > 70%. Her cath was scheduled for later this month in [facility]. She denies any chest pain or DOE in the past.  Max troponin 0.53 despite cardiac arrest, VF and prolonged resuscitation, LVEF on 1/16 60-65% with no regional wall motion abnormalities.  We will plan for a LHC today. If she needs a stent, she should  preferably need a BMS as she is waiting for a kidney transplant and doesn't want to delay by a year.  She is hypertensive, on Cardene 10 mg po daily, carvedilol was increased to 25 mg po BID, amlodipine 10 mg po daily, hydralazine increased to 50 mg po TID.  Continue Start ASA 81mg po daily and atorvastatin 80 mg po daily.  Echo showed normal LVEF, no regional wall motion abnormalities.    Signed, Taaj Hurlbut MD, FACC 03/14/2016   . amLODipine  10 mg Oral Daily  . amoxicillin-clavulanate  1   tablet Oral Daily  . atorvastatin  80 mg Oral q1800  . carvedilol  25 mg Oral BID WC  . chlorhexidine gluconate (MEDLINE KIT)  15 mL Mouth Rinse BID  . Chlorhexidine Gluconate Cloth  6 each Topical Q0600  . famotidine (PEPCID) IV  10 mg Intravenous Q24H  . gabapentin  300 mg Oral QHS  . hydrALAZINE  50 mg Oral Q8H  . isosorbide mononitrate  30 mg Oral Daily  . levothyroxine  75 mcg Oral QAC breakfast  . mouth rinse  15 mL Mouth Rinse 10 times per day  . mupirocin ointment  1 application Nasal BID  . predniSONE  5 mg Oral Q breakfast  . sodium chloride flush  10-40 mL Intracatheter Q12H  . sodium chloride flush  3 mL Intravenous Q12H     

## 2016-03-14 NOTE — Procedures (Signed)
I have seen and examined this patient and agree with the plan of care   Patient comfortable on CRRT  BP 170/80    K 4.4    CO 2  27    Ca 8.1   Phos 3.8   Alb 2.7    Hb 8.9  Fluid removal 200 / hr      Amlodipine     10mg  qd  Coreg 25mg  bid   Imdur 30mg  qd    Hydralazine 50mg  q 8h   Consider adding  :   Lisinopril     Ariela Mochizuki W 03/14/2016, 10:52 AM

## 2016-03-14 NOTE — Progress Notes (Signed)
eLink Physician-Brief Progress Note Patient Name: Renella CunasKatherine C Tigert DOB: Feb 26, 1960 MRN: [id number]   Date of Service  03/14/2016  HPI/Events of Note  Agitation - got OOB and pulled out CVL which had cardene running for BP control.  Remains confused and agitated.  eICU Interventions  Plan: Use PIVs for medication infusion.   Haldol 5 mg IV times one now (QTc less than 500) [facility] remains confused 30 minutes following haldol adminstration     Intervention Category Major Interventions: Other:  DETERDING,ELIZABETH 03/14/2016, 9:50 PM

## 2016-03-14 NOTE — Progress Notes (Signed)
ANTICOAGULATION CONSULT NOTE - Follow Up Consult  Pharmacy Consult for heparin Indication: new onset RLE DVT  Allergies  Allergen Reactions  . Tape Other (See Comments)    Tears skin  . 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    Patient Measurements: Height: 5\' 5"  (165.1 cm) Weight: 245 lb 6 oz (111.3 kg) IBW/kg (Calculated) : 57 Heparin Dosing Weight: 84.2 kg  Vital Signs: Temp: 97.6 F (36.4 C) (01/19 1200) Temp Source: Oral (01/19 1200) Pulse Rate: 73 (01/19 1400)  Labs:  Recent Labs  03/11/16 1630  03/12/16 0005 03/12/16 0430  03/13/16 0415 03/13/16 1550 03/14/16 0030 03/14/16 0328 03/14/16 0329 03/14/16 1335  HGB  --   < >  --  9.2*  --  8.9*  --   --   --  8.9*  --   HCT  --   < >  --  29.0*  --  28.9*  --   --   --  29.5*  --   PLT  --   --   --  208  --  174  --   --   --  181  --   HEPARINUNFRC  --   --   --   --   --   --   --  0.13*  --   --  <0.10*  CREATININE  --   < > 6.43* 5.93*  < > 3.43* 2.90*  --  2.40*  --   --   TROPONINI 0.53*  --  0.56* 0.50*  --   --   --   --   --   --   --   < > = values in this interval not displayed.  Estimated Creatinine Clearance: 32.1 mL/min (by C-G formula based on SCr of 2.4 mg/dL (H)). Pt is on CRRT     Assessment: Pt w/ new onset RLE DVT. She continues to be subtherapeutic with an undectable level on heparin 1350 units/hr. Pt is going to cath lab at 1500 so we will not make any changes at this time. If patient is restarted new rate will be 1600 units/hr. Drip was confirmed to be running properly and no bleeding reported per RN.  Goal of Therapy:  Heparin level 0.3-0.7 units/ml Monitor platelets by anticoagulation protocol: Yes   Plan:  If heparin gtt is continued post cath, increase rate to 1600 units/hr and do not bolus Heparin level as needed Daily heparin level and CBC if gtt is continued Monitor for s/sx bleeding  Lianne CureJustin R Sierria Bruney, PharmD Candidate 03/14/2016,2:56 PM

## 2016-03-15 DIAGNOSIS — J690 Pneumonitis due to inhalation of food and vomit: Secondary | ICD-10-CM

## 2016-03-15 LAB — GLUCOSE, CAPILLARY
GLUCOSE-CAPILLARY: 101 mg/dL — AB (ref 65–99)
GLUCOSE-CAPILLARY: 126 mg/dL — AB (ref 65–99)
GLUCOSE-CAPILLARY: 133 mg/dL — AB (ref 65–99)
GLUCOSE-CAPILLARY: 88 mg/dL (ref 65–99)
GLUCOSE-CAPILLARY: 99 mg/dL (ref 65–99)
Glucose-Capillary: 116 mg/dL — ABNORMAL HIGH (ref 65–99)
Glucose-Capillary: 91 mg/dL (ref 65–99)
Glucose-Capillary: 131 mg/dL — ABNORMAL HIGH (ref 65–99)
Glucose-Capillary: 135 mg/dL — ABNORMAL HIGH (ref 65–99)

## 2016-03-15 LAB — RENAL FUNCTION PANEL
ALBUMIN: 2.7 g/dL — AB (ref 3.5–5.0)
ANION GAP: 10 (ref 5–15)
BUN: 29 mg/dL — ABNORMAL HIGH (ref 6–20)
CALCIUM: 8.7 mg/dL — AB (ref 8.9–10.3)
CO2: 25 mmol/L (ref 22–32)
CREATININE: 3.37 mg/dL — AB (ref 0.44–1.00)
Chloride: 103 mmol/L (ref 101–111)
GFR, EST AFRICAN AMERICAN: 16 mL/min — AB (ref >60)
GFR, EST NON AFRICAN AMERICAN: 14 mL/min — AB (ref >60)
Glucose, Bld: 103 mg/dL — ABNORMAL HIGH (ref 65–99)
PHOSPHORUS: 5.3 mg/dL — AB (ref 2.5–4.6)
Potassium: 4.3 mmol/L (ref 3.5–5.1)
SODIUM: 138 mmol/L (ref 135–145)

## 2016-03-15 LAB — MAGNESIUM: MAGNESIUM: 2.6 mg/dL — AB (ref 1.7–2.4)

## 2016-03-15 LAB — CBC
HEMATOCRIT: 26.5 % — AB (ref 36.0–46.0)
HEMOGLOBIN: 8.2 g/dL — AB (ref 12.0–15.0)
MCH: 28.3 pg (ref 26.0–34.0)
MCHC: 30.9 g/dL (ref 30.0–36.0)
MCV: 91.4 fL (ref 78.0–100.0)
Platelets: 191 10*3/uL (ref 150–400)
RBC: 2.9 MIL/uL — ABNORMAL LOW (ref 3.87–5.11)
RDW: 18.9 % — ABNORMAL HIGH (ref 11.5–15.5)
WBC: 8.1 10*3/uL (ref 4.0–10.5)

## 2016-03-15 LAB — HEPARIN LEVEL (UNFRACTIONATED): Heparin Unfractionated: 0.32 IU/mL (ref 0.30–0.70)

## 2016-03-15 MED ORDER — ALTEPLASE 2 MG IJ SOLR: 2.0000 mg | Freq: Once | INTRAMUSCULAR | Status: DC | PRN

## 2016-03-15 MED ORDER — PENTAFLUOROPROP-TETRAFLUOROETH EX AERO: 1.0000 "application " | INHALATION_SPRAY | CUTANEOUS | Status: DC | PRN

## 2016-03-15 MED ORDER — HEPARIN SODIUM (PORCINE) 1000 UNIT/ML DIALYSIS: 1000.0000 [IU] | INTRAMUSCULAR | Status: DC | PRN

## 2016-03-15 MED ORDER — LIDOCAINE-PRILOCAINE 2.5-2.5 % EX CREA: 1.0000 "application " | TOPICAL_CREAM | CUTANEOUS | Status: DC | PRN

## 2016-03-15 MED ORDER — SODIUM CHLORIDE 0.9 % IV SOLN: 100.0000 mL | INTRAVENOUS | Status: DC | PRN

## 2016-03-15 MED ORDER — HYDRALAZINE HCL 50 MG PO TABS
50.0000 mg | ORAL_TABLET | Freq: Four times a day (QID) | ORAL | Status: DC
  Administered 2016-03-15 – 2016-03-16 (×3): 50 mg via ORAL
  Filled 2016-03-15 (×3): qty 1

## 2016-03-15 MED ORDER — LIDOCAINE HCL (PF) 1 % IJ SOLN: 5.0000 mL | INTRAMUSCULAR | Status: DC | PRN

## 2016-03-15 MED ORDER — HYDRALAZINE HCL 20 MG/ML IJ SOLN
10.0000 mg | Freq: Once | INTRAMUSCULAR | Status: AC
  Administered 2016-03-15: 10 mg via INTRAVENOUS
  Filled 2016-03-15: qty 1

## 2016-03-15 MED ORDER — LISINOPRIL 10 MG PO TABS
10.0000 mg | ORAL_TABLET | Freq: Every day | ORAL | Status: DC
  Administered 2016-03-15: 10 mg via ORAL
  Filled 2016-03-15: qty 1

## 2016-03-15 NOTE — Progress Notes (Signed)
   [facility] PCCM  Pt lost her other PIV and she is on cardene drip for that.  She has another PIV. She is on heparin drip.  Pt is awake, taking PO.   Currently on Coreg, Hydralazine, Lisinopril, ISMN, Norvasc.   Pt is to start HD now.   Plan : 1. Cont BP meds. Will increase hydralazine to 50 mg Q6 from 8. Hold off on cardene drip for now.  2. Observe BP while on HD.  3. If BP remains high despite HD, will need a CVL.   Pollie MeyerJ. Angelo A de Dios, MD 03/15/2016, 12:27 PM [facility] Pager [phone] After 3 pm or if no answer, call [phone]

## 2016-03-15 NOTE — Progress Notes (Signed)
Subjective:    Feels ok. No CP. Cath yesterday with RCA 20% otherwise normal coronaries. EF normal. BP remains high.     Intake/Output Summary (Last 24 hours) at 03/15/16 1326 Last data filed at 03/15/16 1000  Gross per 24 hour  Intake          2424.37 ml  Output              532 ml  Net          1892.37 ml    Current meds: . amLODipine  10 mg Oral Daily  . amoxicillin-clavulanate  1 tablet Oral Daily  . atorvastatin  80 mg Oral q1800  . carvedilol  25 mg Oral BID WC  . Chlorhexidine Gluconate Cloth  6 each Topical Q0600  . famotidine (PEPCID) IV  10 mg Intravenous Q24H  . gabapentin  300 mg Oral QHS  . haloperidol lactate  5 mg Intravenous Once  . hydrALAZINE  50 mg Oral Q6H  . isosorbide mononitrate  30 mg Oral Daily  . levothyroxine  75 mcg Oral QAC breakfast  . lisinopril  10 mg Oral Daily  . mouth rinse  15 mL Mouth Rinse BID  . mupirocin ointment  1 application Nasal BID  . predniSONE  5 mg Oral Q breakfast  . sodium chloride flush  10-40 mL Intracatheter Q12H  . sodium chloride flush  3 mL Intravenous Q12H   Infusions: . heparin 1,600 Units/hr (03/15/16 0326)  . niCARDipine 15 mg/hr (03/15/16 1156)  . dialysis replacement fluid (prismasate) 200 mL/hr at 03/13/16 2052  . dialysis replacement fluid (prismasate) 300 mL/hr at 03/14/16 1015  . dialysate (PRISMASATE) 2,000 mL/hr at 03/14/16 1329     Objective:  Blood pressure (!) 167/58, pulse 78, temperature 97.8 F (36.6 C), temperature source Oral, resp. rate 19, height 5\' 5"  (1.651 m), weight 106.8 kg (235 lb 7.2 oz), SpO2 97 %. Weight change: -4.5 kg (-9 lb 14.7 oz)  PHYSICAL EXAMINATION: General:  Morbidly obese female sitting in chair  in no acute distress. NAD. (-) subj complaints.  Neuro: AAOx3 HEENT:  Normocephalic atraumatic Cardiovascular:  Regular rate and rhythm Lungs:  Clear bilateral breath sounds Abdomen:  Soft, nondistended, wound on abdomen with dressings in place  Ext: mild  edema  Telemetry: NSR  Lab Results: Basic Metabolic Panel:  Recent Labs Lab 03/12/16 0430  03/13/16 0415 03/13/16 1550 03/14/16 0328 03/14/16 0329 03/14/16 1840 03/15/16 0335  NA 138  < > 138 138 138  --  138 138  K 5.1  < > 4.5 4.6 4.4  --  4.5 4.3  CL 100*  < > 103 102 104  --  103 103  CO2 25  < > 25 25 27   --  25 25  GLUCOSE 60*  < > 67 117* 117*  --  118* 103*  BUN 59*  < > 31* 23* 19  --  17 29*  CREATININE 5.93*  < > 3.43* 2.90* 2.40*  --  2.43* 3.37*  CALCIUM 8.2*  < > 7.9* 8.2* 8.1*  --  8.6* 8.7*  MG 2.5*  --  2.3  --   --  2.6*  --  2.6*  PHOS 7.1*  < > 4.8* 4.6 3.8  --  4.2 5.3*  < > = values in this interval not displayed. Liver Function Tests:  Recent Labs Lab 03/13/16 0415 03/13/16 1550 03/14/16 0328 03/14/16 1840 03/15/16 0335  ALBUMIN 2.5* 2.6* 2.7* 2.8* 2.7*  No results for input(s): LIPASE, AMYLASE in the last 168 hours. No results for input(s): AMMONIA in the last 168 hours. CBC:  Recent Labs Lab 03/11/16 0850  03/11/16 1931 03/12/16 0430 03/13/16 0415 03/14/16 0329 03/15/16 0335  WBC 16.9*  --   --  11.1* 10.6* 9.1 8.1  HGB 9.9*  < > 11.2* 9.2* 8.9* 8.9* 8.2*  HCT 32.1*  < > 33.0* 29.0* 28.9* 29.5* 26.5*  MCV 91.5  --   --  88.4 92.6 92.2 91.4  PLT 263  --   --  208 174 181 191  < > = values in this interval not displayed. Cardiac Enzymes:  Recent Labs Lab 03/11/16 1045 03/11/16 1630 03/12/16 0005 03/12/16 0430  TROPONINI 0.21* 0.53* 0.56* 0.50*   BNP: Invalid input(s): POCBNP CBG:  Recent Labs Lab 03/14/16 2347 03/15/16 0039 03/15/16 0343 03/15/16 0725 03/15/16 1218  GLUCAP 133* 126* 101* 91 131*   Microbiology: Lab Results  Component Value Date   CULT NO GROWTH 4 DAYS 03/11/2016   CULT NO GROWTH 4 DAYS 03/11/2016   CULT NO GROWTH 3 DAYS 04/29/2015   CULT NO GROWTH 2 DAYS 04/28/2015   CULT NO GROWTH 5 DAYS 04/28/2015    Recent Labs Lab 03/11/16 1000 03/11/16 1050  CULT NO GROWTH 4 DAYS NO GROWTH 4  DAYS  SDES BLOOD RIGHT FOREARM BLOOD RIGHT FOREARM    Imaging: No results found.   ASSESSMENT:  1. Cardiac arrest -> PEA in setting of hyperK+ 2. Acute respiratory failure 3. ESRD 4. HTN   PLAN/DISCUSSION:  Cath results from yesterday reviewed with her. Essentially normal coronaries. EF normal by echo. BP remains high. Can add clonidine if needed.   We will sign off. Please call with questions   LOS: 4 days    Arvilla Meres, MD 03/15/2016, 1:26 PM

## 2016-03-15 NOTE — Progress Notes (Signed)
Pt A-line SBP above 180. PRN Hydralazine given at 0324 but SBP is still >180.   Paged MD and waiting for further orders for patient.   Fidencio Duddy E Mylinda LatinaNino Combs, [place]RN

## 2016-03-15 NOTE — Progress Notes (Signed)
PULMONARY / CRITICAL CARE MEDICINE PROGRESS NOTE   Name: Valerie Perez MRN: [id number] DOB: 1960-05-30    ADMISSION DATE:  03/11/2016 CONSULTATION DATE:  1/16  CHIEF COMPLAINT:  Arrest  SUBJECTIVE:  Tolerating extubation. Confused. Some agitation last night improved w/o meds.  On Cardene drip.     VITAL SIGNS: BP (!) 167/58   Pulse 78   Temp 97.9 F (36.6 C) (Oral)   Resp 19   Ht 5\' 5"  (1.651 m)   Wt 106.8 kg (235 lb 7.2 oz)   SpO2 97%   BMI 39.18 kg/m   HEMODYNAMICS:    VENTILATOR SETTINGS:  INTAKE / OUTPUT: I/O last 3 completed shifts: In: 3442.1 [P.O.:530; I.V.:2787.1; IV Piggyback:125] Out: 5130 [Other:5130]  PHYSICAL EXAMINATION: General:  Morbidly obese female in no acute distress. NAD. (-) subj complaints.  Neuro: AAOx3 HEENT:  Normocephalic atraumatic Cardiovascular:  Regular rate and rhythm Lungs:  Clear bilateral breath sounds Abdomen:  Soft, nondistended, wound on abdomen with dressings in place  Ext: RLE with 2+ pitting edema and warm, LLE trace pitting edema  LABS:  BMET  Recent Labs Lab 03/14/16 0328 03/14/16 1840 03/15/16 0335  NA 138 138 138  K 4.4 4.5 4.3  CL 104 103 103  CO2 27 25 25   BUN 19 17 29*  CREATININE 2.40* 2.43* 3.37*  GLUCOSE 117* 118* 103*    Electrolytes  Recent Labs Lab 03/13/16 0415  03/14/16 0328 03/14/16 0329 03/14/16 1840 03/15/16 0335  CALCIUM 7.9*  < > 8.1*  --  8.6* 8.7*  MG 2.3  --   --  2.6*  --  2.6*  PHOS 4.8*  < > 3.8  --  4.2 5.3*  < > = values in this interval not displayed.  CBC  Recent Labs Lab 03/13/16 0415 03/14/16 0329 03/15/16 0335  WBC 10.6* 9.1 8.1  HGB 8.9* 8.9* 8.2*  HCT 28.9* 29.5* 26.5*  PLT 174 181 191    Coag's  Recent Labs Lab 03/11/16 1045  APTT 33  INR 1.45    Sepsis Markers  Recent Labs Lab 03/11/16 0850 03/11/16 0900 03/11/16 1146 03/12/16 0430 03/13/16 0415  LATICACIDVEN  --  7.49* 1.64  --   --   PROCALCITON 0.13  --   --  25.42 18.04     ABG  Recent Labs Lab 03/11/16 1130 03/12/16 0347 03/13/16 1005  PHART 7.363 7.477* 7.462*  PCO2ART 38.9 38.8 37.8  PO2ART 77.0* 120.0* 192*    Liver Enzymes  Recent Labs Lab 03/14/16 0328 03/14/16 1840 03/15/16 0335  ALBUMIN 2.7* 2.8* 2.7*    Cardiac Enzymes  Recent Labs Lab 03/11/16 1630 03/12/16 0005 03/12/16 0430  TROPONINI 0.53* 0.56* 0.50*    Glucose  Recent Labs Lab 03/14/16 1208 03/14/16 2012 03/14/16 2347 03/15/16 0039 03/15/16 0343 03/15/16 0725  GLUCAP 121* 116* 133* 126* 101* 91    Imaging No results found. STUDIES:  CT head 1/16 >Mild brain atrophy.  No acute intracranial process. Scattered sinus mucosal thickening and numerous small air-fluid levels suggesting sinusitis. CXR 1/17 > Tube and catheter positions as described without pneumothorax. Slightly less edema compared to 1 day prior. There are pleural effusions bilaterally as well as cardiomegaly and pulmonary venous hypertension consistent with a degree of persistent congestive heart failure. No new opacity.  CULTURES: BCx2 1/16 > NGTD  ANTIBIOTICS: Zosyn 1/16 >> 1/19 Vancomycin 1/16 >> 1/19 Augmentin 1/19 >   SIGNIFICANT EVENTS: 1/16 Cardiac arrest 1/18 Extubated.   LINES/TUBES: Right IJ tunneled dialysis  catheter 1/16 ETT >1/18 Left IJ 1/16 > Aline 1/16 >  DISCUSSION: 56 year old female with end-stage renal disease on dialysis suffered VF/PEA cardiac arrest for in total just over 45 minutes. Potassium found to be greater than 7.5. Admitted to the pulmonary critical care team with plans for normothermia protocol due to prolonged downtime, fluctuant abnormalities, refractory shock.  ASSESSMENT / PLAN:  PULMONARY A: Acute hypoxemic hypercarbic respiratory failure 2/2 Asp PNA/cardiac arrest/Unable to protect airway OSA Pulmonary Edema P:   Extubated 1/18 > tolerating well CPAP QHS On augmentin. CXR in am  CARDIOVASCULAR A:  Cardiogenic shock secondary to  cardiac arrest - resolved LHC  On 1/19 with clean coronaries Now Hypertensive Urgency LVH History of hypertension, hyperlipidemia P:  Telemetry monitoring On Cardene gtt Cont PO HTN meds (Norvasc, hydralazine, imdur, lisinopril). 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 P:   Nephrology following. Appreciate recommendations Start HD on 1/20 On home prednisone 5 mg QD  GASTROINTESTINAL A:   No acute issues Chronic Abdominal Wound P:   Cont diet Renal dosed Pepcid for stress ulcer prophylaxis Seen by wound care- dressing in place, shallow and healing, well not infected  HEMATOLOGIC A:   Anemia of chronic illness R Popliteal DVT P:  Follow CBC Heparin gtt  INFECTIOUS A:   Leukocytosis - WBC 16.9 >11.1>10.6>9.1, BCx NGTD, procalc 25 > 18, possibly due to CAP versus aspiration pneumonia P:   On augmentin Follow WBC and fever curve CXR in am  ENDOCRINE A:   Diabetes mellitus  Hypoglycemic P:   CBG monitoring  D50 prn   NEUROLOGIC A:   Acute anoxic encephalopathy > resolved. Had confusion on 1/19 pm > resolved w/o meds.  P:   Monitor Haldol prn  FAMILY  - Updates:  Husband and daughters updated 1/20 - Inter-disciplinary family meet or Palliative Care meeting due by:  1/22    I spent  30  minutes of Critical Care time with this patient today.  Out to SDU once off cardene drip.    Pollie MeyerJ. Angelo A de Dios, MD 03/15/2016, 10:32 AM [facility] Pager [phone] After 3 pm or if no answer, call [phone]

## 2016-03-15 NOTE — Progress Notes (Signed)
Pt's left shoulder iv infiltrated at 1200. Pt only has one peripheral iv ,with heparin infusing. Iv team attempted iv site overnight without success ( using ultrasound). Spoke with Dr. Clayborn Bignessedios, trying not to  Put in central line . Pt bp was in 170's when infiltrated, Dr. Clayborn Bignessedios adjusted po meds, now having dialysis, rn to monitor .Blood pressure parameters are to keep systolic less than 180. Continue to monitor.Tammy SoursAngela Vivianna Piccini

## 2016-03-15 NOTE — Progress Notes (Signed)
[facility] ROUNDING NOTE   Subjective:   Interval History: Slightly confused this morning with some short term memory loss   Objective:  Vital signs in last 24 hours:  Temp:  [97.6 F (36.4 C)-98.9 F (37.2 C)] 97.9 F (36.6 C) (01/20 0726) Pulse Rate:  [55-87] 70 (01/20 0800) Resp:  [10-29] 17 (01/20 0800) BP: (133-197)/(45-63) 190/60 (01/20 0436) SpO2:  [86 %-100 %] 96 % (01/20 0800) Arterial Line BP: (125-186)/(41-58) 172/53 (01/20 0800) FiO2 (%):  [30 %-40 %] 40 % (01/19 1400) Weight:  [106.8 kg (235 lb 7.2 oz)] 106.8 kg (235 lb 7.2 oz) (01/20 0500)  Weight change: -4.5 kg (-9 lb 14.7 oz) Filed Weights   03/13/16 0500 03/14/16 0322 03/15/16 0500  Weight: 108 kg (238 lb 1.6 oz) 111.3 kg (245 lb 6 oz) 106.8 kg (235 lb 7.2 oz)    Intake/Output: I/O last 3 completed shifts: In: 3426.1 [P.O.:530; I.V.:2771.1; IV Piggyback:125] Out: 5130 [Other:5130]   Intake/Output this shift:  No intake/output data recorded.  CVS- RRR RS- some diffuse rales  ABD- BS present soft non-distended  Obese  Wound clean and dry  EXT- no edema   Basic Metabolic Panel:  Recent Labs Lab 03/12/16 0430  03/13/16 0415 03/13/16 1550 03/14/16 0328 03/14/16 0329 03/14/16 1840 03/15/16 0335  NA 138  < > 138 138 138  --  138 138  K 5.1  < > 4.5 4.6 4.4  --  4.5 4.3  CL 100*  < > 103 102 104  --  103 103  CO2 25  < > 25 25 27   --  25 25  GLUCOSE 60*  < > 67 117* 117*  --  118* 103*  BUN 59*  < > 31* 23* 19  --  17 29*  CREATININE 5.93*  < > 3.43* 2.90* 2.40*  --  2.43* 3.37*  CALCIUM 8.2*  < > 7.9* 8.2* 8.1*  --  8.6* 8.7*  MG 2.5*  --  2.3  --   --  2.6*  --  2.6*  PHOS 7.1*  < > 4.8* 4.6 3.8  --  4.2 5.3*  < > = values in this interval not displayed.  Liver Function Tests:  Recent Labs Lab 03/13/16 0415 03/13/16 1550 03/14/16 0328 03/14/16 1840 03/15/16 0335  ALBUMIN 2.5* 2.6* 2.7* 2.8* 2.7*   No results for input(s): LIPASE, AMYLASE in the last 168 hours. No  results for input(s): AMMONIA in the last 168 hours.  CBC:  Recent Labs Lab 03/11/16 0850  03/11/16 1931 03/12/16 0430 03/13/16 0415 03/14/16 0329 03/15/16 0335  WBC 16.9*  --   --  11.1* 10.6* 9.1 8.1  HGB 9.9*  < > 11.2* 9.2* 8.9* 8.9* 8.2*  HCT 32.1*  < > 33.0* 29.0* 28.9* 29.5* 26.5*  MCV 91.5  --   --  88.4 92.6 92.2 91.4  PLT 263  --   --  208 174 181 191  < > = values in this interval not displayed.  Cardiac Enzymes:  Recent Labs Lab 03/11/16 1045 03/11/16 1630 03/12/16 0005 03/12/16 0430  TROPONINI 0.21* 0.53* 0.56* 0.50*    BNP: Invalid input(s): POCBNP  CBG:  Recent Labs Lab 03/14/16 2012 03/14/16 2347 03/15/16 0039 03/15/16 0343 03/15/16 0725  GLUCAP 116* 133* 126* 101* 91    Microbiology: Results for orders placed or performed during the hospital encounter of 03/11/16  Culture, blood (Routine X 2) Perez Reflex to ID Panel     Status: None (  Preliminary result)   Collection Time: 03/11/16 10:00 AM  Result Value Ref Range Status   Specimen Description BLOOD RIGHT FOREARM  Final   Special Requests BOTTLES DRAWN AEROBIC AND ANAEROBIC  5CC  Final   Culture NO GROWTH 3 DAYS  Final   Report Status PENDING  Incomplete  Culture, blood (Routine X 2) Perez Reflex to ID Panel     Status: None (Preliminary result)   Collection Time: 03/11/16 10:50 AM  Result Value Ref Range Status   Specimen Description BLOOD RIGHT FOREARM  Final   Special Requests IN PEDIATRIC BOTTLE  2CC  Final   Culture NO GROWTH 3 DAYS  Final   Report Status PENDING  Incomplete  MRSA PCR Screening     Status: Abnormal   Collection Time: 03/11/16  1:30 PM  Result Value Ref Range Status   MRSA by PCR POSITIVE (A) NEGATIVE Final    Comment:        The GeneXpert MRSA Assay (FDA approved for NASAL specimens only), is one component of a comprehensive MRSA colonization surveillance program. It is not intended to diagnose MRSA infection nor to guide or monitor treatment for MRSA  infections. CALLED TO RN Allegra LaiSTREAMOSKI BY M.WILSON     Coagulation Studies: No results for input(s): LABPROT, INR in the last 72 hours.  Urinalysis: No results for input(s): COLORURINE, LABSPEC, PHURINE, GLUCOSEU, HGBUR, BILIRUBINUR, KETONESUR, PROTEINUR, UROBILINOGEN, NITRITE, LEUKOCYTESUR in the last 72 hours.  Invalid input(s): APPERANCEUR    Imaging: Dg Chest Port 1 View  Result Date: 03/13/2016 CLINICAL DATA:  Cardiac arrest. Difficulty breathing. History of diabetes and hypertension. EXAM: PORTABLE CHEST 1 VIEW COMPARISON:  03/12/2016 FINDINGS: There is a left IJ catheter with tip in the projection of the SVC. There is a right subclavian dialysis catheter with tips in the projection of the cavoatrial junction. Moderate cardiac enlargement. Pulmonary vascular congestion identified. IMPRESSION: 1. Cardiac enlargement and pulmonary vascular congestion. Electronically Signed   By: Signa Kellaylor  Stroud M.D.   On: 03/13/2016 13:15   Dg Knee Right Port  Result Date: 03/13/2016 CLINICAL DATA:  Pt with right knee and lower leg pain today with bruising after cardiac arrest yesterday. Pt woke today complaining of pain. Lateral right tibia images included on right knee. EXAM: PORTABLE RIGHT KNEE - 1-2 VIEW COMPARISON:  None. FINDINGS: No fracture of the proximal tibia or distal femur. Patella is normal. No joint effusion. IMPRESSION: No fracture or dislocation. Electronically Signed   By: Genevive BiStewart  Edmunds M.D.   On: 03/13/2016 13:10   Dg Tibia/fibula Right Port  Result Date: 03/13/2016 CLINICAL DATA:  right knee and lower leg pain EXAM: PORTABLE RIGHT TIBIA AND FIBULA - 2 VIEW COMPARISON:  None. FINDINGS: There is mild diffuse soft tissue swelling. There is no underlying fracture or subluxation. Mild degenerative changes are noted within the right knee. IMPRESSION: 1. No acute bone abnormality. 2. Soft tissue swelling. Electronically Signed   By: Signa Kellaylor  Stroud M.D.   On: 03/13/2016 13:14      Medications:   . heparin 1,600 Units/hr (03/15/16 0326)  . niCARDipine 15 mg/hr (03/15/16 0844)  . dialysis replacement fluid (prismasate) 200 mL/hr at 03/13/16 2052  . dialysis replacement fluid (prismasate) 300 mL/hr at 03/14/16 1015  . dialysate (PRISMASATE) 2,000 mL/hr at 03/14/16 1329   . amLODipine  10 mg Oral Daily  . amoxicillin-clavulanate  1 tablet Oral Daily  . atorvastatin  80 mg Oral q1800  . carvedilol  25 mg Oral BID WC  . Chlorhexidine  Gluconate Cloth  6 each Topical [id number]  . famotidine (PEPCID) IV  10 mg Intravenous Q24H  . gabapentin  300 mg Oral QHS  . haloperidol lactate  5 mg Intravenous Once  . hydrALAZINE  50 mg Oral Q8H  . isosorbide mononitrate  30 mg Oral Daily  . levothyroxine  75 mcg Oral QAC breakfast  . lisinopril  10 mg Oral Daily  . mouth rinse  15 mL Mouth Rinse BID  . mupirocin ointment  1 application Nasal BID  . predniSONE  5 mg Oral Q breakfast  . sodium chloride flush  10-40 mL Intracatheter Q12H  . sodium chloride flush  3 mL Intravenous Q12H   sodium chloride, acetaminophen **OR** acetaminophen, dextrose, hydrALAZINE, sodium chloride, sodium chloride flush, sodium chloride flush, white petrolatum  Assessment/ Plan:   Cardiogenic Shock s/p cardiac arrest - cardiac cath no intervention 1/19  ESRD Transition for Hemodialysis today   Hypotension improved / shock / now hypertensive  Add lisinopril 10mg  daily  Anemia stable  Dm controlled  Metabolic Bone Disease stable  Hypothyroid  Synthroid  Failed Kidney Transplant   Some decrease in memory apparent -- anoxic injury/ICU/Refused CPAP    New DVT  Anticoagulated   56 year old female with end-stage renal disease on dialysis suffered VF/PEA cardiac arrest for in total just over 45 minutes. Potassium found to be greater than 7.5. Admitted to the pulmonary critical care team with plans for normothermia protocol due to prolonged downtime, fluctuant abnormalities, refractory  shock.   LOS: 4 Valerie Perez @TODAY @9 :18 AM

## 2016-03-15 NOTE — Progress Notes (Signed)
Called [facility] and notified about needing more IV access for patient.   Patient currently has 2 IV access sites- L AC and L shoulder IV.   The left shoulder IV is starting to look like it is infiltrating. IV team consult placed and IV team came to try to start another IV on patient.   IV team attempted to get another IV and could not. They suggested patient needed another central line.   Will pass along to day shift RN.  Cj Beecher E Mylinda LatinaNino Combs, [place]RN

## 2016-03-15 NOTE — Progress Notes (Signed)
ANTICOAGULATION CONSULT NOTE - Follow Up Consult  Pharmacy Consult for Heparin  Indication: DVT, new onset, RLE  Allergies  Allergen Reactions  . Tape Other (See Comments)    Tears skin  . 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    Patient Measurements: Height: 5\' 5"  (165.1 cm) Weight: 235 lb 7.2 oz (106.8 kg) IBW/kg (Calculated) : 57  Vital Signs: Temp: 97.8 F (36.6 C) (01/20 1220) Temp Source: Oral (01/20 1220) BP: 167/58 (01/20 0933) Pulse Rate: 78 (01/20 1000)  Labs:  Recent Labs  03/13/16 0415  03/14/16 0030 03/14/16 0328 03/14/16 0329 03/14/16 1335 03/14/16 1840 03/15/16 0335 03/15/16 1236  HGB 8.9*  --   --   --  8.9*  --   --  8.2*  --   HCT 28.9*  --   --   --  29.5*  --   --  26.5*  --   PLT 174  --   --   --  181  --   --  191  --   HEPARINUNFRC  --   --  0.13*  --   --  <0.10*  --   --  0.32  CREATININE 3.43*  < >  --  2.40*  --   --  2.43* 3.37*  --   < > = values in this interval not displayed.  Estimated Creatinine Clearance: 22.4 mL/min (by C-G formula based on SCr of 3.37 mg/dL (H)).  Assessment: Heparin for new onset RLE DVT Now s/p cath and heparin resumed. Level post restart is at low end of goal (0.3) hgb down slightly to 8.9>8.2. No bleeding issues noted.   Goal of Therapy:  Heparin level 0.3-0.7 units/ml Monitor platelets by anticoagulation protocol: Yes   Plan: Increase Heparin to 1700 units / hr  Heparin level at 2000 Daily heparin level, CBC  Thank you Sheppard CoilFrank Wilson PharmD., BCPS Clinical Pharmacist Pager [phone] 03/15/2016 1:30 PM

## 2016-03-15 NOTE — Progress Notes (Signed)
Speech Language Pathology Treatment: Dysphagia  Patient Details Name: Valerie Perez MRN: [id number] DOB: 12/14/1960 Today's Date: 03/15/2016 Time: 1210-1220 SLP Time Calculation (min) (ACUTE ONLY): 10 min  Assessment / Plan / Recommendation Clinical Impression  Patient upright in chair with family at bedside. She reports she has been tolerating liquids and solids without difficulty s/p Left Heart Cath and Coronary Angiography procedure. Voice is mildly hoarse, patient elicits a strong volitional cough. Observed patient with trials of ice chips, thin liquids via cup, pureed and regular solids. Noted subtle immediate throat clear following larger bolus of thin liquid. No signs of aspiration noted with small cup sips. Provided education to patient and family regarding general aspiration precautions and small sips of liquid to reduce risk of aspiration. Informed RN of following recommendations: continue regular diet with thin liquids, no straws, small cup sips, medications whole in puree. SLP will follow up for diet tolerance.     HPI HPI: A LEVEL 5 CAVEAT PERATAINS DUE TO CARDIAC ARREST.  Pt presents with ongoing CPR.  Per EMS when they arrived she had decreased responsiveness, was bradycardic.  In ambulance she arrested, had vfib and vtach- was defibrillated a total of 4 times.  She had one dose of epi.  She arrives with BVM, NP airway.  Pt is on dialysis and was due to go today, but family states she called to cancel as she was not feeling well.        SLP Plan  Continue with current plan of care     Recommendations  Diet recommendations: Regular;Thin liquid Liquids provided via: Cup;No straw Medication Administration: Whole meds with puree Supervision: Patient able to self feed Compensations: Slow rate;Small sips/bites Postural Changes and/or Swallow Maneuvers: Seated upright 90 degrees                Oral Care Recommendations: Oral care BID Follow up Recommendations: Other  (comment) (TBD) Plan: Continue with current plan of care       GO             Valerie Perez, [place] CF-SLP Speech-Language Pathologist [phone]   Valerie Perez 03/15/2016, 1:22 PM

## 2016-03-15 NOTE — Progress Notes (Signed)
Placed patient on CPAP for the night without complications. RT will continue to monitor as needed. 

## 2016-03-16 ENCOUNTER — Inpatient Hospital Stay (HOSPITAL_COMMUNITY): Payer: Commercial Managed Care - PPO

## 2016-03-16 LAB — HEPARIN LEVEL (UNFRACTIONATED): HEPARIN UNFRACTIONATED: 0.41 [IU]/mL (ref 0.30–0.70)

## 2016-03-16 LAB — BASIC METABOLIC PANEL
Anion gap: 8 (ref 5–15)
BUN: 23 mg/dL — AB (ref 6–20)
CHLORIDE: 103 mmol/L (ref 101–111)
CO2: 26 mmol/L (ref 22–32)
CREATININE: 3.71 mg/dL — AB (ref 0.44–1.00)
Calcium: 8.6 mg/dL — ABNORMAL LOW (ref 8.9–10.3)
GFR calc Af Amer: 15 mL/min — ABNORMAL LOW (ref >60)
GFR, EST NON AFRICAN AMERICAN: 13 mL/min — AB (ref >60)
GLUCOSE: 116 mg/dL — AB (ref 65–99)
Potassium: 3.5 mmol/L (ref 3.5–5.1)
SODIUM: 137 mmol/L (ref 135–145)

## 2016-03-16 LAB — CBC
HCT: 27.4 % — ABNORMAL LOW (ref 36.0–46.0)
Hemoglobin: 8.4 g/dL — ABNORMAL LOW (ref 12.0–15.0)
MCH: 27.6 pg (ref 26.0–34.0)
MCHC: 30.7 g/dL (ref 30.0–36.0)
MCV: 90.1 fL (ref 78.0–100.0)
Platelets: 196 10*3/uL (ref 150–400)
RBC: 3.04 MIL/uL — ABNORMAL LOW (ref 3.87–5.11)
RDW: 18.7 % — AB (ref 11.5–15.5)
WBC: 8.1 10*3/uL (ref 4.0–10.5)

## 2016-03-16 LAB — GLUCOSE, CAPILLARY
GLUCOSE-CAPILLARY: 136 mg/dL — AB (ref 65–99)
GLUCOSE-CAPILLARY: 76 mg/dL (ref 65–99)
GLUCOSE-CAPILLARY: 76 mg/dL (ref 65–99)
Glucose-Capillary: 78 mg/dL (ref 65–99)
Glucose-Capillary: 133 mg/dL — ABNORMAL HIGH (ref 65–99)
Glucose-Capillary: 150 mg/dL — ABNORMAL HIGH (ref 65–99)

## 2016-03-16 LAB — CULTURE, BLOOD (ROUTINE X 2)
Culture: NO GROWTH
Culture: NO GROWTH

## 2016-03-16 LAB — HEPATITIS B SURFACE ANTIBODY,QUALITATIVE: HEP B S AB: REACTIVE

## 2016-03-16 LAB — HEPATITIS B SURFACE ANTIGEN: HEP B S AG: NEGATIVE

## 2016-03-16 MED ORDER — HYDRALAZINE HCL 50 MG PO TABS
100.0000 mg | ORAL_TABLET | Freq: Three times a day (TID) | ORAL | Status: DC
  Administered 2016-03-16 – 2016-03-25 (×25): 100 mg via ORAL
  Filled 2016-03-16 (×27): qty 2

## 2016-03-16 MED ORDER — CLONIDINE HCL 0.1 MG PO TABS
0.1000 mg | ORAL_TABLET | Freq: Two times a day (BID) | ORAL | Status: DC
  Administered 2016-03-16: 0.1 mg via ORAL
  Filled 2016-03-16: qty 1

## 2016-03-16 MED ORDER — LISINOPRIL 10 MG PO TABS
10.0000 mg | ORAL_TABLET | Freq: Two times a day (BID) | ORAL | Status: DC
  Administered 2016-03-16 – 2016-03-17 (×3): 10 mg via ORAL
  Filled 2016-03-16 (×3): qty 1

## 2016-03-16 MED ORDER — HYDRALAZINE HCL 20 MG/ML IJ SOLN
10.0000 mg | INTRAMUSCULAR | Status: DC | PRN
  Administered 2016-03-16 – 2016-03-17 (×4): 20 mg via INTRAVENOUS
  Filled 2016-03-16: qty 2
  Filled 2016-03-16 (×3): qty 1

## 2016-03-16 NOTE — Progress Notes (Signed)
ANTICOAGULATION CONSULT NOTE - Follow Up Consult  Pharmacy Consult for Heparin  Indication: DVT, new onset, RLE  Allergies  Allergen Reactions  . Tape Other (See Comments)    Tears skin  . 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    Patient Measurements: Height: 5\' 5"  (165.1 cm) Weight: 226 lb 10.1 oz (102.8 kg) IBW/kg (Calculated) : 57  Vital Signs: Temp: 99.1 F (37.3 C) (01/21 0700) Temp Source: Axillary (01/21 0700) BP: 150/73 (01/21 0931) Pulse Rate: 58 (01/21 0931)  Labs:  Recent Labs  03/14/16 0329 03/14/16 1335 03/14/16 1840 03/15/16 0335 03/15/16 1236 03/16/16 0538 03/16/16 0539  HGB 8.9*  --   --  8.2*  --   --  8.4*  HCT 29.5*  --   --  26.5*  --   --  27.4*  PLT 181  --   --  191  --   --  196  HEPARINUNFRC  --  <0.10*  --   --  0.32 0.41  --   CREATININE  --   --  2.43* 3.37*  --   --  3.71*    Estimated Creatinine Clearance: 19.9 mL/min (by C-G formula based on SCr of 3.71 mg/dL (H)).  Assessment: Heparin for new onset RLE DVT  Now s/p cath and heparin continued. Heparin level this morning is at goal (0.4) hgb stable 8.4. No bleeding issues noted.   Goal of Therapy:  Heparin level 0.3-0.7 units/ml Monitor platelets by anticoagulation protocol: Yes   Plan: Continue Heparin at 1700 units / hr  Daily heparin level, CBC  Thank you, Sheppard CoilFrank Wilson PharmD., BCPS Clinical Pharmacist Pager [phone] 03/16/2016 10:24 AM

## 2016-03-16 NOTE — Progress Notes (Addendum)
PULMONARY / CRITICAL CARE MEDICINE PROGRESS NOTE   Name: Valerie Perez MRN: [id number] DOB: 1961-02-11    ADMISSION DATE:  03/11/2016 CONSULTATION DATE:  1/16  CHIEF COMPLAINT:  Arrest  SUBJECTIVE:  Lost 1 of 2 PIV access on 1/20, off cardene gtt d/t this, IV access team unable to place PIV. Heparin running through other PIV. BP running 170-180s overnight, occasionally up into 200s.    VITAL SIGNS: BP (!) 211/56   Pulse 72   Temp 98.6 F (37 C) (Oral)   Resp (!) 23   Ht 5\' 5"  (1.651 m)   Wt 226 lb 10.1 oz (102.8 kg)   SpO2 98%   BMI 37.71 kg/m   HEMODYNAMICS:    VENTILATOR SETTINGS:  INTAKE / OUTPUT: I/O last 3 completed shifts: In: 3085.9 [P.O.:1320; I.V.:1765.9] Out: 4300 [Other:4300]  PHYSICAL EXAMINATION: General:  Morbidly obese female in no acute distress.  Neuro: AAOx3, moves all extremities HEENT:  Normocephalic atraumatic Cardiovascular:  Regular rate and rhythm Lungs:  Clear bilateral breath sounds Abdomen:  Soft, nondistended, wound on abdomen with dressings in place  Ext: RLE with 2+ pitting edema and warm, LLE trace pitting edema  LABS:  BMET  Recent Labs Lab 03/14/16 1840 03/15/16 0335 03/16/16 0539  NA 138 138 137  K 4.5 4.3 3.5  CL 103 103 103  CO2 25 25 26   BUN 17 29* 23*  CREATININE 2.43* 3.37* 3.71*  GLUCOSE 118* 103* 116*    Electrolytes  Recent Labs Lab 03/13/16 0415  03/14/16 0328 03/14/16 0329 03/14/16 1840 03/15/16 0335 03/16/16 0539  CALCIUM 7.9*  < > 8.1*  --  8.6* 8.7* 8.6*  MG 2.3  --   --  2.6*  --  2.6*  --   PHOS 4.8*  < > 3.8  --  4.2 5.3*  --   < > = values in this interval not displayed.  CBC  Recent Labs Lab 03/14/16 0329 03/15/16 0335 03/16/16 0539  WBC 9.1 8.1 8.1  HGB 8.9* 8.2* 8.4*  HCT 29.5* 26.5* 27.4*  PLT 181 191 196    Coag's  Recent Labs Lab 03/11/16 1045  APTT 33  INR 1.45    Sepsis Markers  Recent Labs Lab 03/11/16 0850 03/11/16 0900 03/11/16 1146 03/12/16 0430  03/13/16 0415  LATICACIDVEN  --  7.49* 1.64  --   --   PROCALCITON 0.13  --   --  25.42 18.04    ABG  Recent Labs Lab 03/11/16 1130 03/12/16 0347 03/13/16 1005  PHART 7.363 7.477* 7.462*  PCO2ART 38.9 38.8 37.8  PO2ART 77.0* 120.0* 192*    Liver Enzymes  Recent Labs Lab 03/14/16 0328 03/14/16 1840 03/15/16 0335  ALBUMIN 2.7* 2.8* 2.7*    Cardiac Enzymes  Recent Labs Lab 03/11/16 1630 03/12/16 0005 03/12/16 0430  TROPONINI 0.53* 0.56* 0.50*    Glucose  Recent Labs Lab 03/15/16 0725 03/15/16 1218 03/15/16 1614 03/15/16 2047 03/15/16 2303 03/16/16 0334  GLUCAP 91 131* 99 135* 88 78    Imaging No results found. STUDIES:  CT head 1/16 >Mild brain atrophy.  No acute intracranial process. Scattered sinus mucosal thickening and numerous small air-fluid levels suggesting sinusitis. CXR 1/17 > Tube and catheter positions as described without pneumothorax. Slightly less edema compared to 1 day prior. There are pleural effusions bilaterally as well as cardiomegaly and pulmonary venous hypertension consistent with a degree of persistent congestive heart failure. No new opacity. Doppler US 1/18 >> Right Popliteal DVT LHC 1/19 >>  Minimal CAD CXR 1/21 >>  CULTURES: BCx2 1/16 > NGTD  ANTIBIOTICS: Zosyn 1/16 >> 1/19 Vancomycin 1/16 >> 1/19 Augmentin 1/19 >   SIGNIFICANT EVENTS: 1/16 Cardiac arrest 1/18 Extubated.   LINES/TUBES: Right IJ tunneled dialysis catheter 1/16 ETT >1/18 Left IJ 1/16 > Aline 1/16 >  DISCUSSION: 56 year old female with end-stage renal disease on dialysis suffered VF/PEA cardiac arrest for in total just over 45 minutes. Potassium found to be greater than 7.5. Admitted to the pulmonary critical care team with plans for normothermia protocol due to prolonged downtime, fluctuant abnormalities, refractory shock.  ASSESSMENT / PLAN:  PULMONARY A: Acute hypoxemic hypercarbic respiratory failure 2/2 Asp PNA/cardiac arrest/Unable to  protect airway OSA Pulmonary Edema Aspiration Pneumonia P:   Extubated 1/18 > tolerating well CPAP QHS On augmentin (day 6)  CARDIOVASCULAR A:  Cardiogenic shock secondary to cardiac arrest - resolved LHC  On 1/19 with clean coronaries Now Hypertensive Urgency LVH History of hypertension, hyperlipidemia P:  Telemetry monitoring Off Cardene gtt given loss of IV access On amlodipine 10 mg QD, Coreg 25 mg BID, Imdur 30 mg QD, lisinopril 10 mg QD Increased hydralazine to 100 mg TID Start clonidine 0.1 mg BID  RENAL A:   ESRD on dialysis TTS Severe hyperkalemia > resolved 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 P:   Nephrology following. Appreciate recommendations On HD On home prednisone 5 mg QD  GASTROINTESTINAL A:   No acute issues Chronic Abdominal Wound P:   Cont diet Seen by wound care- dressing in place, shallow and healing, well not infected  HEMATOLOGIC A:   Anemia of chronic illness R Popliteal DVT P:  Follow CBC Heparin gtt  INFECTIOUS A:   Possible Aspiration PNA P:   On augmentin (day 6 of abx) Follow WBC and fever curve  ENDOCRINE A:   Diabetes mellitus  Hypoglycemic P:   CBG monitoring   NEUROLOGIC A:   Acute anoxic encephalopathy > resolved. Had confusion on 1/19 pm > resolved w/o meds.  P:   Monitor Haldol prn  FAMILY  - Updates:  Husband and daughters updated 1/20 - Inter-disciplinary family meet or Palliative Care meeting due by:  1/22  Critical Care Time: 34 minutes  Karlene Lineman, DO PGY-3 Internal Medicine Resident Pager # [phone] 03/16/2016 8:04 AM  ATTENDING NOTE / ATTESTATION NOTE :   I have discussed the case with the resident/APP  Dr. Karlene Lineman  I agree with the resident/APP's  history, physical examination, assessment, and plans.    I have edited the above note and modified it according to our agreed history, physical examination,  assessment and plan.   Briefly, patient admitted after cardiac arrest likely related to hyperkalemia given her ESRD. She has since been extubated and is doing well. Tolerated hemodialysis yesterday. Her main issue is hypertensive urgency despite Cardene drip. She lost IV access yesterday and Cardene drip was discontinued. She is on by mouth medicines and her blood pressure is slowly improving, albeit still elevated.   Patient seen, comfortable. Not in distress. Blood pressure 180/90. Heart rate 70s. Respiratory rate 18. Sats more than 90% on 2 L. Morbidly obese. No neck vein distention. Good air entry. Some crackles at the bases. Good S1 and S2. Abdomen was benign. Trace edema.  Assessment/Plan : 1. Status post cardiac arrest likely related to hyperkalemia. Neurologically intact. 2. Status post respiratory failure related to rest. Finish off Augmentin and plan 7 days antibiotics total. 3. Hypertensive  urgency. Currently on by mouth medicines which are being adjusted by nephrology and cardiology as well. Plan to transfer to stepdown unit. Hopefully, hemodialysis will help with her blood pressure control. 4. Cont cpap for OSA. Needs follow-up as an outpatient.  Over all plan is to transfer  to stepdown unit.  Dr. Manson PasseyAlma Devine is aware of pt. Pt will be TRH service as primary starting 1/22 and PCCM off on 1/22.    Family :Family updated at length today.  Husband and daughter updated.   Pollie MeyerJ. Angelo A de Dios, MD 03/16/2016, 1:38 PM [facility] Pager [phone] After 3 pm or if no answer, call [phone]

## 2016-03-16 NOTE — Progress Notes (Signed)
[facility] ROUNDING NOTE   Subjective:   Interval History:     Objective:  Vital signs in last 24 hours:  Temp:  [97.8 F (36.6 C)-99.6 F (37.6 C)] 99.1 F (37.3 C) (01/21 0700) Pulse Rate:  [58-82] 58 (01/21 0931) Resp:  [18-28] 22 (01/21 0931) BP: (125-211)/(46-90) 150/73 (01/21 0931) SpO2:  [89 %-99 %] 96 % (01/21 0931) Arterial Line BP: (128-194)/(42-61) 128/42 (01/21 0931) Weight:  [102.4 kg (225 lb 12 oz)-106.7 kg (235 lb 3.7 oz)] 102.8 kg (226 lb 10.1 oz) (01/21 0330)  Weight change: -0.1 kg (-3.5 oz) Filed Weights   03/15/16 1240 03/15/16 1715 03/16/16 0330  Weight: 106.7 kg (235 lb 3.7 oz) 102.4 kg (225 lb 12 oz) 102.8 kg (226 lb 10.1 oz)    Intake/Output: I/O last 3 completed shifts: In: 3085.9 [P.O.:1320; I.V.:1765.9] Out: 4300 [Other:4300]   Intake/Output this shift:  Total I/O In: 291 [P.O.:240; I.V.:51] Out: -   General:  Morbidly obese female in no acute distress.  Neuro: AAOx3, moves all extremities HEENT:  Normocephalic atraumatic Cardiovascular:  Regular rate and rhythm Lungs:  Clear bilateral breath sounds Abdomen:  Soft, nondistended, wound on abdomen with dressings in place  Ext:  Non pitting chubby ankles   Basic Metabolic Panel:  Recent Labs Lab 03/12/16 0430  03/13/16 0415 03/13/16 1550 03/14/16 0328 03/14/16 0329 03/14/16 1840 03/15/16 0335 03/16/16 0539  NA 138  < > 138 138 138  --  138 138 137  K 5.1  < > 4.5 4.6 4.4  --  4.5 4.3 3.5  CL 100*  < > 103 102 104  --  103 103 103  CO2 25  < > 25 25 27   --  25 25 26   GLUCOSE 60*  < > 67 117* 117*  --  118* 103* 116*  BUN 59*  < > 31* 23* 19  --  17 29* 23*  CREATININE 5.93*  < > 3.43* 2.90* 2.40*  --  2.43* 3.37* 3.71*  CALCIUM 8.2*  < > 7.9* 8.2* 8.1*  --  8.6* 8.7* 8.6*  MG 2.5*  --  2.3  --   --  2.6*  --  2.6*  --   PHOS 7.1*  < > 4.8* 4.6 3.8  --  4.2 5.3*  --   < > = values in this interval not displayed.  Liver Function Tests:  Recent Labs Lab  03/13/16 0415 03/13/16 1550 03/14/16 0328 03/14/16 1840 03/15/16 0335  ALBUMIN 2.5* 2.6* 2.7* 2.8* 2.7*   No results for input(s): LIPASE, AMYLASE in the last 168 hours. No results for input(s): AMMONIA in the last 168 hours.  CBC:  Recent Labs Lab 03/12/16 0430 03/13/16 0415 03/14/16 0329 03/15/16 0335 03/16/16 0539  WBC 11.1* 10.6* 9.1 8.1 8.1  HGB 9.2* 8.9* 8.9* 8.2* 8.4*  HCT 29.0* 28.9* 29.5* 26.5* 27.4*  MCV 88.4 92.6 92.2 91.4 90.1  PLT 208 174 181 191 196    Cardiac Enzymes:  Recent Labs Lab 03/11/16 1045 03/11/16 1630 03/12/16 0005 03/12/16 0430  TROPONINI 0.21* 0.53* 0.56* 0.50*    BNP: Invalid input(s): POCBNP  CBG:  Recent Labs Lab 03/15/16 1614 03/15/16 2047 03/15/16 2303 03/16/16 0334 03/16/16 0804  GLUCAP 99 135* 88 78 76    Microbiology: Results for orders placed or performed during the hospital encounter of 03/11/16  Culture, blood (Routine X 2) w Reflex to ID Panel     Status: None (Preliminary result)   Collection Time: 03/11/16  10:00 AM  Result Value Ref Range Status   Specimen Description BLOOD RIGHT FOREARM  Final   Special Requests BOTTLES DRAWN AEROBIC AND ANAEROBIC  5CC  Final   Culture NO GROWTH 4 DAYS  Final   Report Status PENDING  Incomplete  Culture, blood (Routine X 2) w Reflex to ID Panel     Status: None (Preliminary result)   Collection Time: 03/11/16 10:50 AM  Result Value Ref Range Status   Specimen Description BLOOD RIGHT FOREARM  Final   Special Requests IN PEDIATRIC BOTTLE  2CC  Final   Culture NO GROWTH 4 DAYS  Final   Report Status PENDING  Incomplete  MRSA PCR Screening     Status: Abnormal   Collection Time: 03/11/16  1:30 PM  Result Value Ref Range Status   MRSA by PCR POSITIVE (A) NEGATIVE Final    Comment:        The GeneXpert MRSA Assay (FDA approved for NASAL specimens only), is one component of a comprehensive MRSA colonization surveillance program. It is not intended to diagnose  MRSA infection nor to guide or monitor treatment for MRSA infections. CALLED TO RN Allegra LaiSTREAMOSKI BY M.WILSON     Coagulation Studies: No results for input(s): LABPROT, INR in the last 72 hours.  Urinalysis: No results for input(s): COLORURINE, LABSPEC, PHURINE, GLUCOSEU, HGBUR, BILIRUBINUR, KETONESUR, PROTEINUR, UROBILINOGEN, NITRITE, LEUKOCYTESUR in the last 72 hours.  Invalid input(s): APPERANCEUR    Imaging: Dg Chest Port 1 View  Result Date: 03/16/2016 CLINICAL DATA:  Respiratory failure EXAM: PORTABLE CHEST 1 VIEW COMPARISON:  March 13, 2016 FINDINGS: The left central line has been removed. The right central line is stable. Stable cardiomegaly and mild edema. Opacity in the left retrocardiac region is more prominent in the interval. No other interval change. IMPRESSION: 1. Cardiomegaly and probable mild edema. 2. Left retrocardiac opacity is identified, more prominent in the the interval. Electronically Signed   By: Gerome Samavid  Williams III M.D   On: 03/16/2016 08:33     Medications:   . heparin 1,700 Units/hr (03/15/16 1844)  . niCARDipine Stopped (03/15/16 1210)   . amLODipine  10 mg Oral Daily  . amoxicillin-clavulanate  1 tablet Oral Daily  . atorvastatin  80 mg Oral q1800  . carvedilol  25 mg Oral BID WC  . famotidine (PEPCID) IV  10 mg Intravenous Q24H  . gabapentin  300 mg Oral QHS  . haloperidol lactate  5 mg Intravenous Once  . hydrALAZINE  100 mg Oral Q8H  . isosorbide mononitrate  30 mg Oral Daily  . levothyroxine  75 mcg Oral QAC breakfast  . lisinopril  10 mg Oral BID  . mouth rinse  15 mL Mouth Rinse BID  . mupirocin ointment  1 application Nasal BID  . predniSONE  5 mg Oral Q breakfast  . sodium chloride flush  10-40 mL Intracatheter Q12H  . sodium chloride flush  3 mL Intravenous Q12H   sodium chloride, sodium chloride, sodium chloride, acetaminophen **OR** acetaminophen, alteplase, heparin, hydrALAZINE, lidocaine (PF), lidocaine-prilocaine,  pentafluoroprop-tetrafluoroeth, sodium chloride flush, sodium chloride flush, white petrolatum  Assessment/ Plan:  56 year old female with end-stage renal disease on dialysis suffered VF/PEA cardiac arrest for in total just over 45 minutes. Potassium found to be greater than 7.5. Admitted to the pulmonary critical care team with plans for normothermia protocol due to prolonged downtime, fluctuant abnormalities, refractory shock.  1. Acute Hypoxic hypercarbic respiratory failure now extubated 1/18   2. Cardiogenic Shock following cardiac arrest  Catheterization 1/19  3. Hypertension appears to be the biggest issue now with poor control. Concern regarding renal artery stenosis in transplant kidney 4. History of failed renal transplant  5. Chronic abdominal wound 6. Popliteal DVT right  Discussed with critical care and will increase lisinopril 10mg  BID  ( would avoid clonidine )  Suggest outpatient evaluation at [facility] to evaluate RAS in renal transplant Continue dialysis TTS schedule     LOS: 5 Yamel Bale W @TODAY @10 :04 AM

## 2016-03-17 ENCOUNTER — Encounter (HOSPITAL_COMMUNITY): Payer: Self-pay | Admitting: Internal Medicine

## 2016-03-17 LAB — GLUCOSE, CAPILLARY
GLUCOSE-CAPILLARY: 79 mg/dL (ref 65–99)
GLUCOSE-CAPILLARY: 173 mg/dL — AB (ref 65–99)
GLUCOSE-CAPILLARY: 137 mg/dL — AB (ref 65–99)
Glucose-Capillary: 82 mg/dL (ref 65–99)
Glucose-Capillary: 149 mg/dL — ABNORMAL HIGH (ref 65–99)
Glucose-Capillary: 164 mg/dL — ABNORMAL HIGH (ref 65–99)

## 2016-03-17 LAB — BASIC METABOLIC PANEL
Anion gap: 14 (ref 5–15)
BUN: 49 mg/dL — AB (ref 6–20)
CALCIUM: 9 mg/dL (ref 8.9–10.3)
CO2: 21 mmol/L — ABNORMAL LOW (ref 22–32)
CREATININE: 6.47 mg/dL — AB (ref 0.44–1.00)
Chloride: 102 mmol/L (ref 101–111)
GFR calc Af Amer: 8 mL/min — ABNORMAL LOW (ref >60)
GFR, EST NON AFRICAN AMERICAN: 7 mL/min — AB (ref >60)
GLUCOSE: 81 mg/dL (ref 65–99)
Potassium: 4.1 mmol/L (ref 3.5–5.1)
SODIUM: 137 mmol/L (ref 135–145)

## 2016-03-17 LAB — CBC
HCT: 28 % — ABNORMAL LOW (ref 36.0–46.0)
Hemoglobin: 8.7 g/dL — ABNORMAL LOW (ref 12.0–15.0)
MCH: 27.9 pg (ref 26.0–34.0)
MCHC: 31.1 g/dL (ref 30.0–36.0)
MCV: 89.7 fL (ref 78.0–100.0)
PLATELETS: 256 10*3/uL (ref 150–400)
RBC: 3.12 MIL/uL — ABNORMAL LOW (ref 3.87–5.11)
RDW: 18.5 % — AB (ref 11.5–15.5)
WBC: 10.2 10*3/uL (ref 4.0–10.5)

## 2016-03-17 LAB — POCT ACTIVATED CLOTTING TIME: Activated Clotting Time: 142 seconds

## 2016-03-17 LAB — HEPARIN LEVEL (UNFRACTIONATED): HEPARIN UNFRACTIONATED: 0.36 [IU]/mL (ref 0.30–0.70)

## 2016-03-17 MED ORDER — LABETALOL HCL 200 MG PO TABS
200.0000 mg | ORAL_TABLET | Freq: Two times a day (BID) | ORAL | Status: DC
  Administered 2016-03-17 – 2016-03-25 (×11): 200 mg via ORAL
  Filled 2016-03-17 (×15): qty 1

## 2016-03-17 MED ORDER — NEPRO/CARBSTEADY PO LIQD
237.0000 mL | Freq: Every day | ORAL | Status: DC
  Administered 2016-03-17 – 2016-03-24 (×8): 237 mL via ORAL
  Filled 2016-03-17 (×9): qty 237

## 2016-03-17 MED ORDER — CLONIDINE HCL 0.1 MG PO TABS
0.1000 mg | ORAL_TABLET | Freq: Three times a day (TID) | ORAL | Status: DC
  Administered 2016-03-17 – 2016-03-24 (×19): 0.1 mg via ORAL
  Filled 2016-03-17 (×20): qty 1

## 2016-03-17 NOTE — Progress Notes (Signed)
Nutrition Follow-up  DOCUMENTATION CODES:   Morbid obesity  INTERVENTION:   -Nepro Shake po q HS, each supplement provides 425 kcal and 19 grams protein  NUTRITION DIAGNOSIS:   Increased nutrient needs related to wound healing (dialysis) as evidenced by estimated needs.  Ongoing  GOAL:   Patient will meet greater than or equal to 90% of their needs  Progressing  MONITOR:   PO intake, Supplement acceptance, Labs, Weight trends, Skin, I & O's  REASON FOR ASSESSMENT:   Ventilator    ASSESSMENT:   56 year old female with end-stage renal disease on dialysis suffered VF/PEA cardiac arrest for in total just over 45 minutes. Potassium found to be greater than 7.5. Admitted to the pulmonary critical care team with plans for normothermia protocol due to prolonged downtime, fluctuant abnormalities, refractory shock.  1/19- s/p rt heart cath, CRRT d/c 1/20- transitioned to iHD, advanced to regular consistency diet with thin liquids  Pt currently on a renal/carb modified diet. Appetite is good, noted meal completion of 75-90%.   Pt remains with chronic abdominal wound- per wound care, wound is healing well with no infection. Due to increased needs for HD and wound healing, pt would benefit from addition of supplements. RD to order.   Labs reviewed: CBGS: 76-150.  Diet Order:  Diet renal/carb modified with fluid restriction Diet-HS Snack? Nothing; Room service appropriate? Yes; Fluid consistency: Thin  Skin:  Wound (see comment) (Abdominal wound)  Last BM:  03/17/16  Height:   Ht Readings from Last 1 Encounters:  03/11/16 5\' 5"  (1.651 m)    Weight:   Wt Readings from Last 1 Encounters:  03/17/16 230 lb 2.6 oz (104.4 kg)    Ideal Body Weight:  56.8 kg  BMI:  Body mass index is 38.3 kg/m.  Estimated Nutritional Needs:   Kcal:  2000-2200  Protein:  125-145 grams  Fluid:  1.2 Ld/ay  EDUCATION NEEDS:   No education needs identified at this time  Ayde Record A.  Mayford KnifeWilliams, RD, LDN, CDE Pager: [phone] After hours Pager: [phone]

## 2016-03-17 NOTE — Progress Notes (Signed)
ANTICOAGULATION CONSULT NOTE - Follow Up Consult  Pharmacy Consult for heparin Indication: New onset RLE DVT found 1/18  Allergies  Allergen Reactions  . Tape Other (See Comments)    Tears skin  . 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    Patient Measurements: Height: 5\' 5"  (165.1 cm) Weight: 230 lb 2.6 oz (104.4 kg) IBW/kg (Calculated) : 57 Heparin Dosing Weight: 81 kg  Vital Signs: Temp: 98.7 F (37.1 C) (01/22 0744) Temp Source: Oral (01/22 0744) BP: 176/63 (01/22 0930) Pulse Rate: 72 (01/22 0930)  Labs:  Recent Labs  03/14/16 1840  03/15/16 0335 03/15/16 1236 03/16/16 0538 03/16/16 0539 03/17/16 0824  HGB  --   < > 8.2*  --   --  8.4* 8.7*  HCT  --   --  26.5*  --   --  27.4* 28.0*  PLT  --   --  191  --   --  196 256  HEPARINUNFRC  --   --   --  0.32 0.41  --  0.36  CREATININE 2.43*  --  3.37*  --   --  3.71*  --   < > = values in this interval not displayed.  Estimated Creatinine Clearance: 20.1 mL/min (by C-G formula based on SCr of 3.71 mg/dL (H)). Pt on HD   Assessment: Pt is a [age] YOF admitted for hyperkalemia and cardiac arrest. On 1/18 pt developed a RLE DVT and was subsequently started on heparin. Heparin level is therapeutic at 0.36 at a rate of 1700 units/hr. H/H low stable. No bleeding reported per RN.  Goal of Therapy:  Heparin level 0.3-0.7 units/ml Monitor platelets by anticoagulation protocol: Yes   Plan:  Continue heparin 1700 units/hr Daily CBC/heparin level Monitor for s/sx bleeding  Lianne CureJustin R Hutch Rhett, PharmD Candidate 03/17/2016,9:47 AM

## 2016-03-17 NOTE — Progress Notes (Signed)
Pt has had 3 loose BMs/diarrhea over past 24 hours. NP Schorr text paged to make aware per protocol to check for cdiff. NP placed orders. Contact precautions already in place, enteric packet added to PPE.

## 2016-03-17 NOTE — Progress Notes (Signed)
  [facility] Progress Note   Subjective: no c/o's , hasn't been OOB much.    Vitals:   03/17/16 1230 03/17/16 1300 03/17/16 1313 03/17/16 1400  BP: (!) 194/66 (!) 190/62 (!) 190/62 (!) 162/61  Pulse: 76 78 79 64  Resp: (!) 24 (!) 27  (!) 28  Temp:      TempSrc:      SpO2: 95% 95%  93%  Weight:      Height:        Inpatient medications: . amLODipine  10 mg Oral Daily  . amoxicillin-clavulanate  1 tablet Oral Daily  . atorvastatin  80 mg Oral q1800  . cloNIDine  0.1 mg Oral TID  . feeding supplement (NEPRO CARB STEADY)  237 mL Oral QHS  . gabapentin  300 mg Oral QHS  . haloperidol lactate  5 mg Intravenous Once  . hydrALAZINE  100 mg Oral Q8H  . isosorbide mononitrate  30 mg Oral Daily  . labetalol  200 mg Oral BID  . levothyroxine  75 mcg Oral QAC breakfast  . mouth rinse  15 mL Mouth Rinse BID  . predniSONE  5 mg Oral Q breakfast  . sodium chloride flush  3 mL Intravenous Q12H   . heparin 1,700 Units/hr (03/16/16 2335)   sodium chloride, sodium chloride, sodium chloride, acetaminophen **OR** acetaminophen, alteplase, heparin, hydrALAZINE, lidocaine (PF), lidocaine-prilocaine, pentafluoroprop-tetrafluoroeth, sodium chloride flush, white petrolatum  Exam: Alert, obese WF in no distress No jvd Chest clear bilat RRR no mrg Abd soft ntnd obese Ext no LE edema, mild UE edema Neuro A & Ox 3  Dialysis: TTS [facility] 3.5h   103kg  2/2 bath  AVF / TDC  Hep 3000  - M200 last 1/9  - Calc 1 ug tiw      Assessment: 1. Cardiac arrest (Vfib/ PEA) - sp cooling protocol.   2. Hyperkalemia - K > 7.5 on admission. Went through list of high K+ foods w mg/ servings listed.  Questions answered. DC lisinopril as this may contribute to high K+ even in HD patients.  3. ESRD HD TTS, sp CRRT 4. Volume - is close to dry wt 5. HTN - poorly controlled 6. 2HPTH - stable 7. DM 8. Hx failed renal tx  Plan - HD tomorrow, get vol down and lower dry wt, add clonidine, dc ACEi (w  high K+), change coreg > labet.     Valerie Moselleob Kaikoa Magro MD [facility] pager [phone]   03/17/2016, 3:32 PM    Recent Labs Lab 03/14/16 0328 03/14/16 1840 03/15/16 0335 03/16/16 0539 03/17/16 0824  NA 138 138 138 137 137  K 4.4 4.5 4.3 3.5 4.1  CL 104 103 103 103 102  CO2 27 25 25 26  21*  GLUCOSE 117* 118* 103* 116* 81  BUN 19 17 29* 23* 49*  CREATININE 2.40* 2.43* 3.37* 3.71* 6.47*  CALCIUM 8.1* 8.6* 8.7* 8.6* 9.0  PHOS 3.8 4.2 5.3*  --   --     Recent Labs Lab 03/14/16 0328 03/14/16 1840 03/15/16 0335  ALBUMIN 2.7* 2.8* 2.7*    Recent Labs Lab 03/15/16 0335 03/16/16 0539 03/17/16 0824  WBC 8.1 8.1 10.2  HGB 8.2* 8.4* 8.7*  HCT 26.5* 27.4* 28.0*  MCV 91.4 90.1 89.7  PLT 191 196 256   Iron/TIBC/Ferritin/ %Sat No results found for: IRON, TIBC, FERRITIN, IRONPCTSAT

## 2016-03-17 NOTE — Progress Notes (Signed)
Patient ID: ALIESHA DOLATA, female   DOB: 1960-07-24, 56 y.o.   MRN: [id number]  PROGRESS NOTE    SHIRLETTE SCARBER  NWG:[id number] DOB: Jun 13, 1960 DOA: 03/11/2016  PCP: Pcp Not In System   Brief Narrative:  56 year old female with end-stage renal disease on  Hemodialysis, suffered VF/PEA cardiac arrest for in total just over 45 minutes. Potassium found to be greater than 7.5. Pt was admitted to the pulmonary critical care team with plans for normothermia protocol due to prolonged downtime, fluctuant abnormalities, refractory shock.  SIGNIFICANT EVENTS: 1/16 Cardiac arrest 1/18 Extubated.    Assessment & Plan:   Acute hypoxemic hypercarbic respiratory failure secondary to aspiration pneumonia and cardiac arrest  - Stable respiratory status - Extubated 1/18   Cardiogenic shock secondary to cardiac arrest  - In the setting of hyperkalemia in pt with ESRD. 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 - Appreciate renal following - HF per renal schedule  - On home prednisone 5 mg QD  Chronic Abdominal Wound - Seen by wound care- dressing in place, shallow and healing, well not infected   Anemia of chronic illness / R Popliteal DVT - On heparin drip - Hgb stable     Possible Aspiration PNA  - On Augmentin today day 7  - Per PCCM this would be last day of abx   Acute anoxic encephalopathy - Had confusion on 1/19 - Now resolved   Hypertensive urgency - Despite Cardene drip. She lost IV access 1/20 and Cardene drip was discontinued. - Currently on clonidine, hydralazine, Imdur, labetalol and Norvasc   Dyslipidemia - Continue Lipitor   Hypothyroidism - Continue synthroid   OSA on CPAP - Stable     DVT prophylaxis: Heparin drip  Code Status: full code  Family  Communication:  Disposition Plan: home once stable, anticipate discharge by 1/24   Consultants:   Cardiology   Procedures:   Intubation 1/16 --> 1/18  Antimicrobials:   Zosyn 1/16 >> 1/19  Vancomycin 1/16 >> 1/19  Augmentin 1/19 >     Subjective: No overnight events.   Objective: Vitals:   03/17/16 1230 03/17/16 1300 03/17/16 1313 03/17/16 1400  BP: (!) 194/66 (!) 190/62 (!) 190/62 (!) 162/61  Pulse: 76 78 79 64  Resp: (!) 24 (!) 27  (!) 28  Temp:      TempSrc:      SpO2: 95% 95%  93%  Weight:      Height:        Intake/Output Summary (Last 24 hours) at 03/17/16 1427 Last data filed at 03/17/16 1100  Gross per 24 hour  Intake              837 ml  Output                0 ml  Net              837 ml   Filed Weights   03/15/16 1715 03/16/16 0330 03/17/16 0349  Weight: 102.4 kg (225 lb 12 oz) 102.8 kg (226 lb 10.1 oz) 104.4 kg (230 lb 2.6 oz)    Examination:  General exam: Appears calm and comfortable  Respiratory system: coarse breath sounds but no wheezing  Cardiovascular system: S1 & S2 heard, RRR. No JVD Gastrointestinal system: Abdomen is nondistended,  soft and nontender. No organomegaly or masses felt. Normal bowel sounds heard. Central nervous system: Alert and oriented. No focal neurological deficits. Extremities: Symmetric 5 x 5 power. +2 RLE pitting edema > LLE Skin: No rashes, lesions or ulcers Psychiatry: Judgement and insight appear normal. Mood & affect appropriate.   Data Reviewed: I have personally reviewed following labs and imaging studies  CBC:  Recent Labs Lab 03/13/16 0415 03/14/16 0329 03/15/16 0335 03/16/16 0539 03/17/16 0824  WBC 10.6* 9.1 8.1 8.1 10.2  HGB 8.9* 8.9* 8.2* 8.4* 8.7*  HCT 28.9* 29.5* 26.5* 27.4* 28.0*  MCV 92.6 92.2 91.4 90.1 89.7  PLT 174 181 191 196 256   Basic Metabolic Panel:  Recent Labs Lab 03/12/16 0430  03/13/16 0415 03/13/16 1550 03/14/16 0328 03/14/16 0329 03/14/16 1840 03/15/16 0335  03/16/16 0539 03/17/16 0824  NA 138  < > 138 138 138  --  138 138 137 137  K 5.1  < > 4.5 4.6 4.4  --  4.5 4.3 3.5 4.1  CL 100*  < > 103 102 104  --  103 103 103 102  CO2 25  < > 25 25 27   --  25 25 26  21*  GLUCOSE 60*  < > 67 117* 117*  --  118* 103* 116* 81  BUN 59*  < > 31* 23* 19  --  17 29* 23* 49*  CREATININE 5.93*  < > 3.43* 2.90* 2.40*  --  2.43* 3.37* 3.71* 6.47*  CALCIUM 8.2*  < > 7.9* 8.2* 8.1*  --  8.6* 8.7* 8.6* 9.0  MG 2.5*  --  2.3  --   --  2.6*  --  2.6*  --   --   PHOS 7.1*  < > 4.8* 4.6 3.8  --  4.2 5.3*  --   --   < > = values in this interval not displayed. GFR: Estimated Creatinine Clearance: 11.5 mL/min (by C-G formula based on SCr of 6.47 mg/dL (H)). Liver Function Tests:  Recent Labs Lab 03/13/16 0415 03/13/16 1550 03/14/16 0328 03/14/16 1840 03/15/16 0335  ALBUMIN 2.5* 2.6* 2.7* 2.8* 2.7*   No results for input(s): LIPASE, AMYLASE in the last 168 hours. No results for input(s): AMMONIA in the last 168 hours. Coagulation Profile:  Recent Labs Lab 03/11/16 1045  INR 1.45   Cardiac Enzymes:  Recent Labs Lab 03/11/16 1045 03/11/16 1630 03/12/16 0005 03/12/16 0430  TROPONINI 0.21* 0.53* 0.56* 0.50*   BNP (last 3 results) No results for input(s): PROBNP in the last 8760 hours. HbA1C: No results for input(s): HGBA1C in the last 72 hours. CBG:  Recent Labs Lab 03/16/16 1958 03/16/16 2343 03/17/16 0403 03/17/16 0746 03/17/16 1131  GLUCAP 150* 76 79 82 173*   Lipid Profile: No results for input(s): CHOL, HDL, LDLCALC, TRIG, CHOLHDL, LDLDIRECT in the last 72 hours. Thyroid Function Tests: No results for input(s): TSH, T4TOTAL, FREET4, T3FREE, THYROIDAB in the last 72 hours. Anemia Panel: No results for input(s): VITAMINB12, FOLATE, FERRITIN, TIBC, IRON, RETICCTPCT in the last 72 hours. Urine analysis:    Component Value Date/Time   COLORURINE YELLOW 04/28/2015 2025   APPEARANCEUR CLEAR 04/28/2015 2025   LABSPEC 1.010 04/28/2015  2025   PHURINE 8.5 (H) 04/28/2015 2025   GLUCOSEU 250 (A) 04/28/2015 2025   HGBUR SMALL (A) 04/28/2015 2025   BILIRUBINUR NEGATIVE 04/28/2015 2025   KETONESUR NEGATIVE 04/28/2015 2025   PROTEINUR >300 (A) 04/28/2015 2025   NITRITE NEGATIVE 04/28/2015 2025   LEUKOCYTESUR NEGATIVE  04/28/2015 2025   Sepsis Labs: @LABRCNTIP (procalcitonin:4,lacticidven:4)    Recent Results (from the past 240 hour(s))  Culture, blood (Routine X 2) w Reflex to ID Panel     Status: None   Collection Time: 03/11/16 10:00 AM  Result Value Ref Range Status   Specimen Description BLOOD RIGHT FOREARM  Final   Special Requests BOTTLES DRAWN AEROBIC AND ANAEROBIC  5CC  Final   Culture NO GROWTH 5 DAYS  Final   Report Status 03/16/2016 FINAL  Final  Culture, blood (Routine X 2) w Reflex to ID Panel     Status: None   Collection Time: 03/11/16 10:50 AM  Result Value Ref Range Status   Specimen Description BLOOD RIGHT FOREARM  Final   Special Requests IN PEDIATRIC BOTTLE  [id number]  Final   Culture NO GROWTH 5 DAYS  Final   Report Status 03/16/2016 FINAL  Final  MRSA PCR Screening     Status: Abnormal   Collection Time: 03/11/16  1:30 PM  Result Value Ref Range Status   MRSA by PCR POSITIVE (A) NEGATIVE Final      Radiology Studies: Ct Head Wo Contrast Result Date: 03/11/2016 Mild brain atrophy.  No acute intracranial process. Scattered sinus mucosal thickening and numerous small air-fluid levels suggesting sinusitis.  Dg Chest Port 1 View Result Date: 03/16/2016 1. Cardiomegaly and probable mild edema. 2. Left retrocardiac opacity is identified, more prominent in the the interval.   Dg Chest Port 1 View Result Date: 03/13/2016 1. Cardiac enlargement and pulmonary vascular congestion.  Dg Chest Port 1 View Result Date: 03/12/2016 Tube and catheter positions as described without pneumothorax. Slightly less edema compared to 1 day prior. There are pleural effusions bilaterally as well as cardiomegaly and  pulmonary venous hypertension consistent with a degree of persistent congestive heart failure. No new opacity. Electronically Signed   By: Bretta BangWilliam  Woodruff III M.D.   On: 03/12/2016 07:40   Dg Chest Port 1 View Result Date: 03/11/2016 Left jugular central venous catheter tip in the SVC. No pneumothorax. Diffuse bilateral airspace disease shows partial clearing in the upper lobes but remains significant in the lower lobes. Endotracheal tube remains in good position.   Dg Chest Portable 1 View  Result Date: 03/11/2016 1. Endotracheal tube in good position. 2. Advanced airspace disease, bilaterality favoring pulmonary edema. Aspiration/pneumonia is also considered given history of vomiting.  Dg Knee Right Port Result Date: 03/13/2016 No fracture or dislocation.   Dg Tibia/fibula Right Port Result Date: 03/13/2016 1. No acute bone abnormality. 2. Soft tissue swelling.   Dg Abd Portable 1 View Result Date: 03/11/2016 Orogastric tube tip and side port in stomach. Visualized bowel gas pattern unremarkable.    Scheduled Meds: . amLODipine  10 mg Oral Daily  . amoxicillin-clavulanate  1 tablet Oral Daily  . atorvastatin  80 mg Oral q1800  . cloNIDine  0.1 mg Oral TID  . feeding supplement (NEPRO CARB STEADY)  237 mL Oral QHS  . gabapentin  300 mg Oral QHS  . haloperidol lactate  5 mg Intravenous Once  . hydrALAZINE  100 mg Oral Q8H  . isosorbide mononitrate  30 mg Oral Daily  . labetalol  200 mg Oral BID  . levothyroxine  75 mcg Oral QAC breakfast  . mouth rinse  15 mL Mouth Rinse BID  . predniSONE  5 mg Oral Q breakfast  . sodium chloride flush  3 mL Intravenous Q12H   Continuous Infusions: . heparin 1,700 Units/hr (03/16/16 2335)  LOS: 6 days    Time spent: 25 minutes  Greater than 50% of the time spent on counseling and coordinating the care.   Manson Passey, MD [facility] Pager [phone]  If 7PM-7AM, please contact night-coverage [email or web address] Password  [facility] 03/17/2016, 2:27 PM

## 2016-03-18 DIAGNOSIS — I82401 Acute embolism and thrombosis of unspecified deep veins of right lower extremity: Secondary | ICD-10-CM

## 2016-03-18 LAB — BASIC METABOLIC PANEL
ANION GAP: 13 (ref 5–15)
BUN: 71 mg/dL — ABNORMAL HIGH (ref 6–20)
CALCIUM: 8.8 mg/dL — AB (ref 8.9–10.3)
CO2: 21 mmol/L — ABNORMAL LOW (ref 22–32)
Chloride: 102 mmol/L (ref 101–111)
Creatinine, Ser: 8.27 mg/dL — ABNORMAL HIGH (ref 0.44–1.00)
GFR, EST AFRICAN AMERICAN: 6 mL/min — AB (ref >60)
GFR, EST NON AFRICAN AMERICAN: 5 mL/min — AB (ref >60)
Glucose, Bld: 85 mg/dL (ref 65–99)
Potassium: 4.2 mmol/L (ref 3.5–5.1)
Sodium: 136 mmol/L (ref 135–145)

## 2016-03-18 LAB — CBC
HCT: 23.5 % — ABNORMAL LOW (ref 36.0–46.0)
HEMOGLOBIN: 7.5 g/dL — AB (ref 12.0–15.0)
MCH: 28.2 pg (ref 26.0–34.0)
MCHC: 31.9 g/dL (ref 30.0–36.0)
MCV: 88.3 fL (ref 78.0–100.0)
PLATELETS: 265 10*3/uL (ref 150–400)
RBC: 2.66 MIL/uL — AB (ref 3.87–5.11)
RDW: 18.5 % — ABNORMAL HIGH (ref 11.5–15.5)
WBC: 9.2 10*3/uL (ref 4.0–10.5)

## 2016-03-18 LAB — GLUCOSE, CAPILLARY
GLUCOSE-CAPILLARY: 99 mg/dL (ref 65–99)
Glucose-Capillary: 124 mg/dL — ABNORMAL HIGH (ref 65–99)
Glucose-Capillary: 180 mg/dL — ABNORMAL HIGH (ref 65–99)
Glucose-Capillary: 182 mg/dL — ABNORMAL HIGH (ref 65–99)

## 2016-03-18 LAB — IRON AND TIBC
IRON: 15 ug/dL — AB (ref 28–170)
Saturation Ratios: 6 % — ABNORMAL LOW (ref 10.4–31.8)
TIBC: 270 ug/dL (ref 250–450)
UIBC: 255 ug/dL

## 2016-03-18 LAB — HEPARIN LEVEL (UNFRACTIONATED): HEPARIN UNFRACTIONATED: 0.35 [IU]/mL (ref 0.30–0.70)

## 2016-03-18 MED ORDER — DARBEPOETIN ALFA 150 MCG/0.3ML IJ SOSY
150.0000 ug | PREFILLED_SYRINGE | INTRAMUSCULAR | Status: DC
  Administered 2016-03-18 – 2016-03-25 (×2): 150 ug via INTRAVENOUS
  Filled 2016-03-18: qty 0.3

## 2016-03-18 MED ORDER — LORATADINE 10 MG PO TABS
10.0000 mg | ORAL_TABLET | Freq: Every day | ORAL | Status: DC
  Administered 2016-03-19 – 2016-03-20 (×2): 10 mg via ORAL
  Filled 2016-03-18 (×2): qty 1

## 2016-03-18 MED ORDER — WARFARIN SODIUM 5 MG PO TABS
5.0000 mg | ORAL_TABLET | Freq: Once | ORAL | Status: AC
  Administered 2016-03-18: 5 mg via ORAL
  Filled 2016-03-18: qty 1

## 2016-03-18 MED ORDER — WARFARIN - PHARMACIST DOSING INPATIENT
Freq: Every day | Status: DC
  Administered 2016-03-21 – 2016-03-22 (×2)

## 2016-03-18 MED ORDER — MONTELUKAST SODIUM 10 MG PO TABS
10.0000 mg | ORAL_TABLET | Freq: Every day | ORAL | Status: DC
  Administered 2016-03-18 – 2016-03-25 (×8): 10 mg via ORAL
  Filled 2016-03-18 (×8): qty 1

## 2016-03-18 MED ORDER — FLUTICASONE PROPIONATE 50 MCG/ACT NA SUSP
1.0000 | Freq: Every day | NASAL | Status: DC
  Administered 2016-03-19 – 2016-03-25 (×7): 1 via NASAL
  Filled 2016-03-18: qty 16

## 2016-03-18 MED ORDER — DARBEPOETIN ALFA 150 MCG/0.3ML IJ SOSY
PREFILLED_SYRINGE | INTRAMUSCULAR | Status: AC
  Filled 2016-03-18: qty 0.3

## 2016-03-18 NOTE — Progress Notes (Addendum)
Patient ID: Valerie Perez, female   DOB: 23-Feb-1961, 56 y.o.   MRN: [id number]  PROGRESS NOTE    Valerie Perez  WUJ:[id number] DOB: 01-30-1961 DOA: 03/11/2016  PCP: Pcp Not In System   Brief Narrative:  56 year old female with end-stage renal disease on  Hemodialysis, suffered VF/PEA cardiac arrest for in total just over 45 minutes. Potassium found to be greater than 7.5. Pt was admitted to the pulmonary critical care team with plans for normothermia protocol due to prolonged downtime, fluctuant abnormalities, refractory shock.  SIGNIFICANT EVENTS: 1/16 Cardiac arrest 1/18 Extubated.    Assessment & Plan:   Acute hypoxemic hypercarbic respiratory failure secondary to aspiration pneumonia and cardiac arrest  - Stable respiratory status - Extubated 1/18   Cardiogenic shock secondary to cardiac arrest  - In the setting of hyperkalemia in pt with ESRD. 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 - Appreciate renal following - HF per renal schedule  - On home prednisone 5 mg QD  Chronic Abdominal Wound - Seen by wound care- dressing in place, shallow and healing, well not infected  Anemia of chronic illness / R Popliteal DVT - On heparin drip, start coumadin tonight, spoke with pt about the choice of AC and why we cant use others likel Eliquis or xarelto  - Hgb stable     Possible Aspiration PNA  - Completed 7 days on Augmentin 03/17/16   Acute anoxic encephalopathy - Had confusion on 1/19 - Now resolved, mental status great, oriented to time, place and person   Hypertensive urgency - Despite Cardene drip. She lost IV access 1/20 and Cardene drip was discontinued. - Currently  on clonidine, hydralazine, Imdur, labetalol and Norvasc   Dyslipidemia - Continue Lipitor   Hypothyroidism - Continue synthroid   OSA on CPAP - Stable     DVT prophylaxis: Heparin drip and coumadin  Code Status: full code  Family Communication: updated pt at bedside  Disposition Plan: home once INR at therapeutic level.    Consultants:   Cardiology   Procedures:   Intubation 1/16 --> 1/18  Antimicrobials:   Zosyn 1/16 >> 1/19  Vancomycin 1/16 >> 1/19  Augmentin 1/19 > > 1/22   Subjective: No overnight events.   Objective: Vitals:   03/18/16 0930 03/18/16 1000 03/18/16 1030 03/18/16 1311  BP: (!) 136/59 (!) 134/57 (!) 142/62 (!) 187/60  Pulse: 68 65 64   Resp: (!) 24 17 18    Temp:  97.5 F (36.4 C)    TempSrc:  Oral    SpO2:      Weight:      Height:        Intake/Output Summary (Last 24 hours) at 03/18/16 1317 Last data filed at 03/18/16 0700  Gross per 24 hour  Intake             1026 ml  Output                0 ml  Net             1026 ml   Filed Weights   03/17/16 0349 03/18/16 0400 03/18/16 0720  Weight: 104.4 kg (230 lb 2.6 oz) 106.4 kg (234 lb 9.1  oz) 110.4 kg (243 lb 6.2 oz)    Examination:  General exam: Appears calm and comfortable, no distress  Respiratory system: coarse breath sounds but no wheezing  Cardiovascular system: S1 & S2 heard, Rate controlled  Gastrointestinal system: (+) BS, non tender abdomen  Central nervous system: Alert and oriented. No focal neurological deficits. Extremities: Symmetric 5 x 5 power. +2 RLE pitting edema > LLE Skin: No rashes, lesions or ulcers, skin is warm and dry  Psychiatry: Normal mood and behavior   Data Reviewed: I have personally reviewed following labs and imaging studies  CBC:  Recent Labs Lab 03/14/16 0329 03/15/16 0335 03/16/16 0539 03/17/16 0824 03/18/16 0500  WBC 9.1 8.1 8.1 10.2 9.2  HGB 8.9* 8.2* 8.4* 8.7* 7.5*  HCT 29.5* 26.5* 27.4* 28.0* 23.5*  MCV 92.2 91.4 90.1  89.7 88.3  PLT 181 191 196 256 265   Basic Metabolic Panel:  Recent Labs Lab 03/12/16 0430  03/13/16 0415 03/13/16 1550 03/14/16 0328 03/14/16 0329 03/14/16 1840 03/15/16 0335 03/16/16 0539 03/17/16 0824 03/18/16 0500  NA 138  < > 138 138 138  --  138 138 137 137 136  K 5.1  < > 4.5 4.6 4.4  --  4.5 4.3 3.5 4.1 4.2  CL 100*  < > 103 102 104  --  103 103 103 102 102  CO2 25  < > 25 25 27   --  25 25 26  21* 21*  GLUCOSE 60*  < > 67 117* 117*  --  118* 103* 116* 81 85  BUN 59*  < > 31* 23* 19  --  17 29* 23* 49* 71*  CREATININE 5.93*  < > 3.43* 2.90* 2.40*  --  2.43* 3.37* 3.71* 6.47* 8.27*  CALCIUM 8.2*  < > 7.9* 8.2* 8.1*  --  8.6* 8.7* 8.6* 9.0 8.8*  MG 2.5*  --  2.3  --   --  2.6*  --  2.6*  --   --   --   PHOS 7.1*  < > 4.8* 4.6 3.8  --  4.2 5.3*  --   --   --   < > = values in this interval not displayed. GFR: Estimated Creatinine Clearance: 9.3 mL/min (by C-G formula based on SCr of 8.27 mg/dL (H)). Liver Function Tests:  Recent Labs Lab 03/13/16 0415 03/13/16 1550 03/14/16 0328 03/14/16 1840 03/15/16 0335  ALBUMIN 2.5* 2.6* 2.7* 2.8* 2.7*   No results for input(s): LIPASE, AMYLASE in the last 168 hours. No results for input(s): AMMONIA in the last 168 hours. Coagulation Profile: No results for input(s): INR, PROTIME in the last 168 hours. Cardiac Enzymes:  Recent Labs Lab 03/11/16 1630 03/12/16 0005 03/12/16 0430  TROPONINI 0.53* 0.56* 0.50*   BNP (last 3 results) No results for input(s): PROBNP in the last 8760 hours. HbA1C: No results for input(s): HGBA1C in the last 72 hours. CBG:  Recent Labs Lab 03/17/16 1632 03/17/16 1644 03/17/16 2058 03/18/16 0014 03/18/16 0528  GLUCAP 149* 164* 137* 124* 99   Lipid Profile: No results for input(s): CHOL, HDL, LDLCALC, TRIG, CHOLHDL, LDLDIRECT in the last 72 hours. Thyroid Function Tests: No results for input(s): TSH, T4TOTAL, FREET4, T3FREE, THYROIDAB in the last 72 hours. Anemia Panel:  Recent  Labs  03/18/16 0930  TIBC 270  IRON 15*   Urine analysis:    Component Value Date/Time   COLORURINE YELLOW 04/28/2015 2025   APPEARANCEUR CLEAR 04/28/2015 2025   LABSPEC 1.010 04/28/2015 2025  PHURINE 8.5 (H) 04/28/2015 2025   GLUCOSEU 250 (A) 04/28/2015 2025   HGBUR SMALL (A) 04/28/2015 2025   BILIRUBINUR NEGATIVE 04/28/2015 2025   KETONESUR NEGATIVE 04/28/2015 2025   PROTEINUR >300 (A) 04/28/2015 2025   NITRITE NEGATIVE 04/28/2015 2025   LEUKOCYTESUR NEGATIVE 04/28/2015 2025   Sepsis Labs: @LABRCNTIP (procalcitonin:4,lacticidven:4)    Recent Results (from the past 240 hour(s))  Culture, blood (Routine X 2) w Reflex to ID Panel     Status: None   Collection Time: 03/11/16 10:00 AM  Result Value Ref Range Status   Specimen Description BLOOD RIGHT FOREARM  Final   Special Requests BOTTLES DRAWN AEROBIC AND ANAEROBIC  5CC  Final   Culture NO GROWTH 5 DAYS  Final   Report Status 03/16/2016 FINAL  Final  Culture, blood (Routine X 2) w Reflex to ID Panel     Status: None   Collection Time: 03/11/16 10:50 AM  Result Value Ref Range Status   Specimen Description BLOOD RIGHT FOREARM  Final   Special Requests IN PEDIATRIC BOTTLE  [facility]2CC  Final   Culture NO GROWTH 5 DAYS  Final   Report Status 03/16/2016 FINAL  Final  MRSA PCR Screening     Status: Abnormal   Collection Time: 03/11/16  1:30 PM  Result Value Ref Range Status   MRSA by PCR POSITIVE (A) NEGATIVE Final      Radiology Studies: Ct Head Wo Contrast Result Date: 03/11/2016 Mild brain atrophy.  No acute intracranial process. Scattered sinus mucosal thickening and numerous small air-fluid levels suggesting sinusitis.  Dg Chest Port 1 View Result Date: 03/16/2016 1. Cardiomegaly and probable mild edema. 2. Left retrocardiac opacity is identified, more prominent in the the interval.   Dg Chest Port 1 View Result Date: 03/13/2016 1. Cardiac enlargement and pulmonary vascular congestion.  Dg Chest Port 1 View Result  Date: 03/12/2016 Tube and catheter positions as described without pneumothorax. Slightly less edema compared to 1 day prior. There are pleural effusions bilaterally as well as cardiomegaly and pulmonary venous hypertension consistent with a degree of persistent congestive heart failure. No new opacity. Electronically Signed   By: Bretta BangWilliam  Woodruff III M.D.   On: 03/12/2016 07:40   Dg Chest Port 1 View Result Date: 03/11/2016 Left jugular central venous catheter tip in the SVC. No pneumothorax. Diffuse bilateral airspace disease shows partial clearing in the upper lobes but remains significant in the lower lobes. Endotracheal tube remains in good position.   Dg Chest Portable 1 View  Result Date: 03/11/2016 1. Endotracheal tube in good position. 2. Advanced airspace disease, bilaterality favoring pulmonary edema. Aspiration/pneumonia is also considered given history of vomiting.  Dg Knee Right Port Result Date: 03/13/2016 No fracture or dislocation.   Dg Tibia/fibula Right Port Result Date: 03/13/2016 1. No acute bone abnormality. 2. Soft tissue swelling.   Dg Abd Portable 1 View Result Date: 03/11/2016 Orogastric tube tip and side port in stomach. Visualized bowel gas pattern unremarkable.    Scheduled Meds: . amLODipine  10 mg Oral Daily  . atorvastatin  80 mg Oral q1800  . cloNIDine  0.1 mg Oral TID  . Darbepoetin Alfa      . darbepoetin (ARANESP) injection - DIALYSIS  150 mcg Intravenous Q Tue-HD  . feeding supplement (NEPRO CARB STEADY)  237 mL Oral QHS  . gabapentin  300 mg Oral QHS  . haloperidol lactate  5 mg Intravenous Once  . hydrALAZINE  100 mg Oral Q8H  . isosorbide mononitrate  30  mg Oral Daily  . labetalol  200 mg Oral BID  . levothyroxine  75 mcg Oral QAC breakfast  . mouth rinse  15 mL Mouth Rinse BID  . predniSONE  5 mg Oral Q breakfast  . sodium chloride flush  3 mL Intravenous Q12H   Continuous Infusions: . heparin 1,700 Units/hr (03/18/16 1107)     LOS: 7  days    Time spent: 15 minutes  Greater than 50% of the time spent on counseling and coordinating the care.   Manson Passey, MD [facility] Pager [phone]  If 7PM-7AM, please contact night-coverage [email or web address] Password [email or web address] 03/18/2016, 1:17 PM

## 2016-03-18 NOTE — Progress Notes (Signed)
Pt's Daughter, Thornton ParkHanna Cox and Pt's husband are requesting a dietary consult for renal/ carb modified diet. Pt now taking coumadin also. Daughter and father request to be present when dietician educating.   Etheleen MayhewHeather Cox (Daughter): [phone]

## 2016-03-18 NOTE — Progress Notes (Signed)
ANTICOAGULATION CONSULT NOTE - Follow Up Consult  Pharmacy Consult for heparin w/ bridge to warfarin Indication: New onset RLE DVT found 1/18  Allergies  Allergen Reactions  . Tape Other (See Comments)    Tears skin  . 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    Patient Measurements: Height: 5\' 5"  (165.1 cm) Weight: 243 lb 6.2 oz (110.4 kg) IBW/kg (Calculated) : 57 Heparin Dosing Weight: 82 kg  Vital Signs: Temp: 97.5 F (36.4 C) (01/23 1000) Temp Source: Oral (01/23 1000) BP: 142/62 (01/23 1030) Pulse Rate: 64 (01/23 1030)  Labs:  Recent Labs  03/16/16 0538  03/16/16 0539 03/17/16 0824 03/18/16 0500  HGB  --   < > 8.4* 8.7* 7.5*  HCT  --   --  27.4* 28.0* 23.5*  PLT  --   --  196 256 265  HEPARINUNFRC 0.41  --   --  0.36 0.35  CREATININE  --   --  3.71* 6.47* 8.27*  < > = values in this interval not displayed.  Estimated Creatinine Clearance: 9.3 mL/min (by C-G formula based on SCr of 8.27 mg/dL (H)). Patient is on HD    Assessment: Pt is a [age] YOF admitted for hyperkalemia and cardiac arrest. On 1/18 pt developed a RLE DVT and was subsequently started on heparin. Heparin level is therapeutic at 0.35 at a rate of 1700 units/hr. H/H low at 7.5/23.5, slightly down from 8.7/28. No bleeding reported per RN.  Pharmacy has been consulted to start warfarin. Of note, INR was elevated @ 1.45 on 1/16 without anticoagulation.  Goal of Therapy:  INR 2-3 Heparin level 0.3-0.7 units/ml Monitor platelets by anticoagulation protocol: Yes   Plan:  Continue heparin 1700 units/hr Warfarin 5 mg PO x1 Daily CBC/heparin level/INR Monitor for s/sx bleeding  Lianne CureJustin R Crowder, PharmD Candidate 03/18/2016,12:54 PM   I discussed / reviewed the pharmacy note by Elta GuadeloupeJustin Crowder and I agree with the student's findings and plans as documented.  Mackie Paienee Kindel Rochefort, PharmD PGY1 Pharmacy Resident Pager: [phone] 03/18/2016 1:43 PM

## 2016-03-18 NOTE — Progress Notes (Signed)
SLP Cancellation Note  Patient Details Name: Valerie Perez MRN: [id number] DOB: 04/15/1960   Cancelled treatment:       Reason Eval/Treat Not Completed: Patient at procedure or test/unavailable   Ozias Dicenzo, Riley NearingBonnie Caroline 03/18/2016, 8:13 AM

## 2016-03-18 NOTE — Progress Notes (Signed)
[facility] Progress Note   Subjective: wants O2 off and OOB  Vitals:   03/18/16 1433 03/18/16 1500 03/18/16 1547 03/18/16 1600  BP: (!) 186/70 (!) 176/59  (!) 156/57  Pulse: 81 78 83 75  Resp:  (!) 27 18 (!) 23  Temp:    99.2 F (37.3 C)  TempSrc:    Oral  SpO2: 96% (!) 88% 93% 98%  Weight:      Height:        Inpatient medications: . amLODipine  10 mg Oral Daily  . atorvastatin  80 mg Oral q1800  . cloNIDine  0.1 mg Oral TID  . Darbepoetin Alfa      . darbepoetin (ARANESP) injection - DIALYSIS  150 mcg Intravenous Q Tue-HD  . feeding supplement (NEPRO CARB STEADY)  237 mL Oral QHS  . gabapentin  300 mg Oral QHS  . haloperidol lactate  5 mg Intravenous Once  . hydrALAZINE  100 mg Oral Q8H  . isosorbide mononitrate  30 mg Oral Daily  . labetalol  200 mg Oral BID  . levothyroxine  75 mcg Oral QAC breakfast  . mouth rinse  15 mL Mouth Rinse BID  . predniSONE  5 mg Oral Q breakfast  . sodium chloride flush  3 mL Intravenous Q12H  . warfarin  5 mg Oral ONCE-1800  . Warfarin - Pharmacist Dosing Inpatient   Does not apply q1800   . heparin 1,700 Units/hr (03/18/16 1107)   sodium chloride, sodium chloride, sodium chloride, acetaminophen **OR** acetaminophen, hydrALAZINE, sodium chloride flush, white petrolatum  Exam: Alert, obese WF in no distress No jvd Chest clear bilat RRR no mrg Abd soft ntnd obese Ext +edema LE's Neuro A & Ox 3  Dialysis: TTS [facility] 3.5h   103kg  2/2 bath  AVF / TDC  Hep 3000  - M200 last 1/9  - Calc 1 ug tiw      Assessment: 1. Cardiac arrest (Vfib/ PEA) - sp cooling protocol.   2. Hyperkalemia - high K on admission. Resolved. DC"d lisinopril with hx high K+.  3. ESRD HD TTS, sp CRRT 4. Volume - is close to dry wt 5. HTN - a little better, cont meds (norvasc/ clon/ labet/ hydral), cont lower vol 6. 2HPTH - stable 7. DM 8. Hx failed renal tx  Plan - HD Wed, lower dry wt more  Vinson Moselleob Kaycee Mcgaugh MD [facility] pager [phone]   03/18/2016, 4:18 PM    Recent Labs Lab 03/14/16 0328 03/14/16 1840 03/15/16 0335 03/16/16 0539 03/17/16 0824 03/18/16 0500  NA 138 138 138 137 137 136  K 4.4 4.5 4.3 3.5 4.1 4.2  CL 104 103 103 103 102 102  CO2 27 25 25 26  21* 21*  GLUCOSE 117* 118* 103* 116* 81 85  BUN 19 17 29* 23* 49* 71*  CREATININE 2.40* 2.43* 3.37* 3.71* 6.47* 8.27*  CALCIUM 8.1* 8.6* 8.7* 8.6* 9.0 8.8*  PHOS 3.8 4.2 5.3*  --   --   --     Recent Labs Lab 03/14/16 0328 03/14/16 1840 03/15/16 0335  ALBUMIN 2.7* 2.8* 2.7*    Recent Labs Lab 03/16/16 0539 03/17/16 0824 03/18/16 0500  WBC 8.1 10.2 9.2  HGB 8.4* 8.7* 7.5*  HCT 27.4* 28.0* 23.5*  MCV 90.1 89.7 88.3  PLT 196 256 265   Iron/TIBC/Ferritin/ %Sat    Component Value Date/Time   IRON 15 (L) 03/18/2016 0930   TIBC 270 03/18/2016 0930   IRONPCTSAT 6 (L)  03/18/2016 0930     

## 2016-03-19 LAB — CBC
HCT: 28.4 % — ABNORMAL LOW (ref 36.0–46.0)
HEMATOCRIT: 26 % — AB (ref 36.0–46.0)
Hemoglobin: 8.3 g/dL — ABNORMAL LOW (ref 12.0–15.0)
Hemoglobin: 9 g/dL — ABNORMAL LOW (ref 12.0–15.0)
MCH: 28.1 pg (ref 26.0–34.0)
MCH: 28 pg (ref 26.0–34.0)
MCHC: 31.9 g/dL (ref 30.0–36.0)
MCHC: 31.7 g/dL (ref 30.0–36.0)
MCV: 88.1 fL (ref 78.0–100.0)
MCV: 88.5 fL (ref 78.0–100.0)
PLATELETS: 413 10*3/uL — AB (ref 150–400)
Platelets: 349 10*3/uL (ref 150–400)
RBC: 2.95 MIL/uL — ABNORMAL LOW (ref 3.87–5.11)
RBC: 3.21 MIL/uL — ABNORMAL LOW (ref 3.87–5.11)
RDW: 17.7 % — AB (ref 11.5–15.5)
RDW: 17.5 % — AB (ref 11.5–15.5)
WBC: 8.6 10*3/uL (ref 4.0–10.5)
WBC: 10.2 10*3/uL (ref 4.0–10.5)

## 2016-03-19 LAB — BASIC METABOLIC PANEL
Anion gap: 12 (ref 5–15)
BUN: 42 mg/dL — AB (ref 6–20)
CHLORIDE: 96 mmol/L — AB (ref 101–111)
CO2: 26 mmol/L (ref 22–32)
Calcium: 9 mg/dL (ref 8.9–10.3)
Creatinine, Ser: 5.32 mg/dL — ABNORMAL HIGH (ref 0.44–1.00)
GFR calc Af Amer: 9 mL/min — ABNORMAL LOW (ref >60)
GFR calc non Af Amer: 8 mL/min — ABNORMAL LOW (ref >60)
Glucose, Bld: 117 mg/dL — ABNORMAL HIGH (ref 65–99)
POTASSIUM: 4.1 mmol/L (ref 3.5–5.1)
Sodium: 134 mmol/L — ABNORMAL LOW (ref 135–145)

## 2016-03-19 LAB — HEPARIN LEVEL (UNFRACTIONATED)
HEPARIN UNFRACTIONATED: 0.14 [IU]/mL — AB (ref 0.30–0.70)
Heparin Unfractionated: 0.21 IU/mL — ABNORMAL LOW (ref 0.30–0.70)

## 2016-03-19 LAB — PROTIME-INR
INR: 1.18
Prothrombin Time: 15.1 seconds (ref 11.4–15.2)

## 2016-03-19 LAB — GLUCOSE, CAPILLARY
GLUCOSE-CAPILLARY: 96 mg/dL (ref 65–99)
GLUCOSE-CAPILLARY: 183 mg/dL — AB (ref 65–99)
GLUCOSE-CAPILLARY: 168 mg/dL — AB (ref 65–99)
Glucose-Capillary: 147 mg/dL — ABNORMAL HIGH (ref 65–99)
Glucose-Capillary: 92 mg/dL (ref 65–99)

## 2016-03-19 MED ORDER — NA FERRIC GLUC CPLX IN SUCROSE 12.5 MG/ML IV SOLN
125.0000 mg | INTRAVENOUS | Status: DC
  Administered 2016-03-22: 125 mg via INTRAVENOUS
  Filled 2016-03-19 (×3): qty 10

## 2016-03-19 MED ORDER — WARFARIN SODIUM 7.5 MG PO TABS
7.5000 mg | ORAL_TABLET | Freq: Once | ORAL | Status: AC
  Administered 2016-03-19: 7.5 mg via ORAL
  Filled 2016-03-19: qty 1

## 2016-03-19 NOTE — Progress Notes (Signed)
ANTICOAGULATION CONSULT NOTE - Follow Up Consult  Pharmacy Consult for heparin w/ bridge to warfarin Indication: New onset RLE DVT found 1/18  Allergies  Allergen Reactions  . Tape Other (See Comments)    Tears skin  . 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    Patient Measurements: Height: 5\' 5"  (165.1 cm) Weight: 217 lb 2.5 oz (98.5 kg) IBW/kg (Calculated) : 57 Heparin Dosing Weight: 82 kg  Vital Signs: Temp: 98 F (36.7 C) (01/24 1534) Temp Source: Oral (01/24 1534) BP: 178/97 (01/24 1534) Pulse Rate: 73 (01/24 1600)  Labs:  Recent Labs  03/17/16 0824 03/18/16 0500 03/19/16 0255 03/19/16 1613  HGB 8.7* 7.5* 8.3* 9.0*  HCT 28.0* 23.5* 26.0* 28.4*  PLT 256 265 349 413*  LABPROT  --   --  15.1  --   INR  --   --  1.18  --   HEPARINUNFRC 0.36 0.35 0.21* 0.14*  CREATININE 6.47* 8.27* 5.32*  --     Estimated Creatinine Clearance: 13.6 mL/min (by C-G formula based on SCr of 5.32 mg/dL (H)). Patient is on HD    Assessment: [age] YOF admitted 1/16 s/p cardiac arrest. On 1/18 pt developed a RLE DVT and was subsequently started on heparin bridge to warfarin. INR subtherapeutic at 1.18. Hgb low stable, Plt wnl, no bleeding reported per RN.  Heparin level remains subtherapeutic (trend down) after rate increase (0.14). Per discussion with RN, patient's IV had to be replaced (~1230) and probably was not receiving full heparin dose prior to this. Will increase heparin drip rate slightly for now and f/u repeat level. No bleed issues reported.  Goal of Therapy:  INR 2-3 Heparin level 0.3-0.7 units/ml Monitor platelets by anticoagulation protocol: Yes   Plan:  Increase heparin to 2000 units/hr Warfarin 7.5 mg PO x1 8h heparin level Daily CBC/heparin level/INR Monitor for s/sx bleeding   Babs BertinHaley Annalee Meyerhoff, PharmD, BCPS Clinical Pharmacist 03/19/2016 5:17 PM

## 2016-03-19 NOTE — Progress Notes (Signed)
SLP Cancellation Note  Patient Details Name: Valerie Perez Ask MRN: [id number] DOB: 04-13-1960   Cancelled treatment:        Pt in HD will f/u.                                                                                                 Pricilla Moehle, Riley NearingBonnie Caroline 03/19/2016, 8:15 AM

## 2016-03-19 NOTE — Progress Notes (Deleted)
[facility] Progress Note   Subjective: wants to work w PT, they haven't come by yet.    Vitals:   03/19/16 1100 03/19/16 1110 03/19/16 1156 03/19/16 1200  BP: (!) 161/63 (!) 163/59 (!) 175/54   Pulse: 64 65 76 76  Resp: 16 16  (!) 23  Temp: 98.1 F (36.7 C) 98.4 F (36.9 C) 98.2 F (36.8 C)   TempSrc: Oral Oral Oral   SpO2:  97% 94% 94%  Weight:  98.5 kg (217 lb 2.5 oz)    Height:        Inpatient medications: . amLODipine  10 mg Oral Daily  . atorvastatin  80 mg Oral q1800  . cloNIDine  0.1 mg Oral TID  . darbepoetin (ARANESP) injection - DIALYSIS  150 mcg Intravenous Q Tue-HD  . feeding supplement (NEPRO CARB STEADY)  237 mL Oral QHS  . [START ON 03/22/2016] ferric gluconate (FERRLECIT/NULECIT) IV  125 mg Intravenous Q T,Th,Sa-HD  . fluticasone  1 spray Each Nare Daily  . gabapentin  300 mg Oral QHS  . hydrALAZINE  100 mg Oral Q8H  . isosorbide mononitrate  30 mg Oral Daily  . labetalol  200 mg Oral BID  . levothyroxine  75 mcg Oral QAC breakfast  . loratadine  10 mg Oral Daily  . montelukast  10 mg Oral QHS  . predniSONE  5 mg Oral Q breakfast  . Warfarin - Pharmacist Dosing Inpatient   Does not apply q1800   . heparin 1,900 Units/hr (03/19/16 0344)   acetaminophen **OR** acetaminophen, white petrolatum  Exam: Alert, obese WF in no distress No jvd Chest clear bilat RRR no mrg Abd soft ntnd obese, healing open wound SP area, no drainage Ext +edema LE's Neuro A & Ox 3  Dialysis: TTS [facility] 3.5h   103kg  2/2 bath  AVF / TDC  Hep 3000  - M200 last 1/9  - Calc 1 ug tiw      Assessment: 1. Cardiac arrest (Vfib/ PEA/ hyperkalemia severe) - sp cooling protocol. Recovered well. Heart cath no sig CAD.  Should be ready to transfer out of SDU - cause of arrest was most likely severe hyperkalemia and heart cath showed no sig disease.  K is normal now.  2. Hyperkalemia - high K on admission. DC"d lisinopril with hx high K+. K+teaching done by renal team.   3. ESRD HD TTS, sp CRRT 4. Volume - is 5 kg under dry wt 5. HTN - bp's still high, lowering dry wt, cont meds (norvasc/ clon/ labet/ hydral) 6. 2HPTH - stable 7. DM 8. Hx failed renal tx 9. Debility - PT to eval today  Plan - HD today off schedule (our mistake), will not need another HD until Saturday, if still here.   Vinson Moselle MD [facility] pager [phone]   03/19/2016, 1:01 PM    Recent Labs Lab 03/14/16 0328 03/14/16 1840 03/15/16 0335  03/17/16 0824 03/18/16 0500 03/19/16 0255  NA 138 138 138  < > 137 136 134*  K 4.4 4.5 4.3  < > 4.1 4.2 4.1  CL 104 103 103  < > 102 102 96*  CO2 27 25 25   < > 21* 21* 26  GLUCOSE 117* 118* 103*  < > 81 85 117*  BUN 19 17 29*  < > 49* 71* 42*  CREATININE 2.40* 2.43* 3.37*  < > 6.47* 8.27* 5.32*  CALCIUM 8.1* 8.6* 8.7*  < > 9.0 8.8* 9.0  PHOS  3.8 4.2 5.3*  --   --   --   --   < > = values in this interval not displayed.  Recent Labs Lab 03/14/16 0328 03/14/16 1840 03/15/16 0335  ALBUMIN 2.7* 2.8* 2.7*    Recent Labs Lab 03/17/16 0824 03/18/16 0500 03/19/16 0255  WBC 10.2 9.2 8.6  HGB 8.7* 7.5* 8.3*  HCT 28.0* 23.5* 26.0*  MCV 89.7 88.3 88.1  PLT 256 265 349   Iron/TIBC/Ferritin/ %Sat    Component Value Date/Time   IRON 15 (L) 03/18/2016 0930   TIBC 270 03/18/2016 0930   IRONPCTSAT 6 (L) 03/18/2016 0930

## 2016-03-19 NOTE — Progress Notes (Signed)
PT Cancellation Note  Patient Details Name: Valerie Perez MRN: [id number] DOB: Mar 20, 1960   Cancelled Treatment:    Reason Eval/Treat Not Completed: Patient at procedure or test/unavailable. Pt off of the floor for dialysis. PT will continue to f/u with pt as appropriate and available.    Alessandra BevelsJennifer M Tavi Gaughran 03/19/2016, 7:57 AM

## 2016-03-19 NOTE — Progress Notes (Signed)
Patient ID: Valerie Perez, female   DOB: [birth year], 58 y.o.   MRN: [id number]  PROGRESS NOTE    Valerie Perez  WUJ:[id number] DOB: 11-Jul-1960 DOA: 03/11/2016  PCP: Pcp Not In System   Brief Narrative:  56 year old female with end-stage renal disease on  Hemodialysis, suffered VF/PEA cardiac arrest for in total just over 45 minutes. Potassium found to be greater than 7.5. Pt was admitted to the pulmonary critical care team with plans for normothermia protocol due to prolonged downtime, fluctuant abnormalities, refractory shock.  SIGNIFICANT EVENTS: 1/16 Cardiac arrest 1/18 Extubated.    Assessment & Plan:   Acute hypoxemic hypercarbic respiratory failure secondary to aspiration pneumonia and cardiac arrest  - Stable respiratory status - Extubated 1/18   Cardiogenic shock secondary to cardiac arrest / Elevated troponin  - In the setting of hyperkalemia in pt with ESRD. 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 - Appreciate renal following - Per renal schedule, she is currently at the dialysis  - On home prednisone 5 mg QD  Chronic Abdominal Wound - Seen by wound care- dressing in place, shallow and healing, well not infected  Anemia of chronic illness / R Popliteal DVT - On heparin drip, started Coumadin 03/18/2016 - Hemoglobin is 8.3, overall stable    Possible Aspiration PNA  - Completed 7 days on Augmentin 03/17/16   Acute anoxic encephalopathy - Had confusion on 1/19 - Now resolved, mental status great, oriented to time, place and person   Hypertensive urgency - Despite Cardene drip. She lost IV access 1/20 and Cardene drip was discontinued. - Continue clonidine, hydralazine, Imdur, labetalol and Norvasc  - Blood pressure  142/49  Dyslipidemia - Continue Lipitor   Hypothyroidism - Continue synthroid   OSA on CPAP - Stable     DVT prophylaxis: Heparin drip and coumadin  Code Status: full code  Family Communication: No family at the bedside Disposition Plan: home once INR at therapeutic level.    Consultants:   Cardiology   Nephrology  Procedures:   Intubation 1/16 --> 1/18  Hemodialysis  Antimicrobials:   Zosyn 1/16 >> 1/19  Vancomycin 1/16 >> 1/19  Augmentin 1/19 > > 1/22   Subjective: No overnight events.   Objective: Vitals:   03/19/16 0715 03/19/16 0734 03/19/16 0740 03/19/16 0800  BP: (!) 152/56 (!) 149/59 (!) 143/59 (!) 142/49  Pulse: 66 62 61 61  Resp: (!) 21 18 17 19   Temp: 98.8 F (37.1 C) 98.1 F (36.7 C)    TempSrc: Oral Oral    SpO2: 100% 100%  99%  Weight: 102.5 kg (225 lb 15.5 oz)     Height:        Intake/Output Summary (Last 24 hours) at 03/19/16 0911 Last data filed at 03/19/16 0500  Gross per 24 hour  Intake           582.53 ml  Output             4000 ml  Net         -3417.47 ml   Filed Weights   03/18/16 0720 03/18/16 1125 03/19/16 0715  Weight: 106.2 kg (234 lb 2.1 oz) 101.8 kg (224 lb 6.9 oz) 102.5 kg (225 lb 15.5 oz)    Examination:  General exam: No acute distress  Respiratory system: Bilateral air entry, no wheezing Cardiovascular system: S1 & S2 heard, Rate controlled  Gastrointestinal system: (+) BS, abdomen obese but not tender Central nervous system: No focal neurological deficits. Extremities: Symmetric 5 x 5 power. +2 RLE pitting edema > LLE Skin: Warm and dry Psychiatry: Normal mood and behavior, not agitated or restless  Data Reviewed: I have personally reviewed following labs and imaging studies  CBC:  Recent Labs Lab 03/15/16 0335 03/16/16 0539 03/17/16 0824 03/18/16 0500 03/19/16 0255  WBC 8.1 8.1 10.2 9.2 8.6  HGB 8.2* 8.4* 8.7* 7.5* 8.3*  HCT 26.5* 27.4* 28.0* 23.5* 26.0*  MCV 91.4 90.1 89.7 88.3 88.1   PLT 191 196 256 265 349   Basic Metabolic Panel:  Recent Labs Lab 03/13/16 0415 03/13/16 1550 03/14/16 0328 03/14/16 0329 03/14/16 1840 03/15/16 0335 03/16/16 0539 03/17/16 0824 03/18/16 0500 03/19/16 0255  NA 138 138 138  --  138 138 137 137 136 134*  K 4.5 4.6 4.4  --  4.5 4.3 3.5 4.1 4.2 4.1  CL 103 102 104  --  103 103 103 102 102 96*  CO2 25 25 27   --  25 25 26  21* 21* 26  GLUCOSE 67 117* 117*  --  118* 103* 116* 81 85 117*  BUN 31* 23* 19  --  17 29* 23* 49* 71* 42*  CREATININE 3.43* 2.90* 2.40*  --  2.43* 3.37* 3.71* 6.47* 8.27* 5.32*  CALCIUM 7.9* 8.2* 8.1*  --  8.6* 8.7* 8.6* 9.0 8.8* 9.0  MG 2.3  --   --  2.6*  --  2.6*  --   --   --   --   PHOS 4.8* 4.6 3.8  --  4.2 5.3*  --   --   --   --    GFR: Estimated Creatinine Clearance: 13.9 mL/min (by C-G formula based on SCr of 5.32 mg/dL (H)). Liver Function Tests:  Recent Labs Lab 03/13/16 0415 03/13/16 1550 03/14/16 0328 03/14/16 1840 03/15/16 0335  ALBUMIN 2.5* 2.6* 2.7* 2.8* 2.7*   No results for input(s): LIPASE, AMYLASE in the last 168 hours. No results for input(s): AMMONIA in the last 168 hours. Coagulation Profile:  Recent Labs Lab 03/19/16 0255  INR 1.18   Cardiac Enzymes: No results for input(s): CKTOTAL, CKMB, CKMBINDEX, TROPONINI in the last 168 hours. BNP (last 3 results) No results for input(s): PROBNP in the last 8760 hours. HbA1C: No results for input(s): HGBA1C in the last 72 hours. CBG:  Recent Labs Lab 03/18/16 0528 03/18/16 1618 03/18/16 2026 03/19/16 0013 03/19/16 0614  GLUCAP 99 182* 180* 147* 92   Lipid Profile: No results for input(s): CHOL, HDL, LDLCALC, TRIG, CHOLHDL, LDLDIRECT in the last 72 hours. Thyroid Function Tests: No results for input(s): TSH, T4TOTAL, FREET4, T3FREE, THYROIDAB in the last 72 hours. Anemia Panel:  Recent Labs  03/18/16 0930  TIBC 270  IRON 15*   Urine analysis:    Component Value Date/Time   COLORURINE YELLOW 04/28/2015 2025    APPEARANCEUR CLEAR 04/28/2015 2025   LABSPEC 1.010 04/28/2015 2025   PHURINE 8.5 (H) 04/28/2015 2025   GLUCOSEU 250 (A) 04/28/2015 2025   HGBUR SMALL (A) 04/28/2015 2025   BILIRUBINUR NEGATIVE 04/28/2015 2025   KETONESUR NEGATIVE 04/28/2015 2025   PROTEINUR >300 (A) 04/28/2015 2025   NITRITE NEGATIVE 04/28/2015 2025   LEUKOCYTESUR NEGATIVE 04/28/2015 2025   Sepsis Labs: @LABRCNTIP (procalcitonin:4,lacticidven:4)    Recent Results (from the past 240 hour(s))  Culture, blood (Routine  X 2) w Reflex to ID Panel     Status: None   Collection Time: 03/11/16 10:00 AM  Result Value Ref Range Status   Specimen Description BLOOD RIGHT FOREARM  Final   Special Requests BOTTLES DRAWN AEROBIC AND ANAEROBIC  5CC  Final   Culture NO GROWTH 5 DAYS  Final   Report Status 03/16/2016 FINAL  Final  Culture, blood (Routine X 2) w Reflex to ID Panel     Status: None   Collection Time: 03/11/16 10:50 AM  Result Value Ref Range Status   Specimen Description BLOOD RIGHT FOREARM  Final   Special Requests IN PEDIATRIC BOTTLE  [facility]  Final   Culture NO GROWTH 5 DAYS  Final   Report Status 03/16/2016 FINAL  Final  MRSA PCR Screening     Status: Abnormal   Collection Time: 03/11/16  1:30 PM  Result Value Ref Range Status   MRSA by PCR POSITIVE (A) NEGATIVE Final      Radiology Studies: Ct Head Wo Contrast Result Date: 03/11/2016 Mild brain atrophy.  No acute intracranial process. Scattered sinus mucosal thickening and numerous small air-fluid levels suggesting sinusitis.  Dg Chest Port 1 View Result Date: 03/16/2016 1. Cardiomegaly and probable mild edema. 2. Left retrocardiac opacity is identified, more prominent in the the interval.   Dg Chest Port 1 View Result Date: 03/13/2016 1. Cardiac enlargement and pulmonary vascular congestion.  Dg Chest Port 1 View Result Date: 03/12/2016 Tube and catheter positions as described without pneumothorax. Slightly less edema compared to 1 day prior. There  are pleural effusions bilaterally as well as cardiomegaly and pulmonary venous hypertension consistent with a degree of persistent congestive heart failure. No new opacity. Electronically Signed   By: Bretta Bang III M.D.   On: 03/12/2016 07:40   Dg Chest Port 1 View Result Date: 03/11/2016 Left jugular central venous catheter tip in the SVC. No pneumothorax. Diffuse bilateral airspace disease shows partial clearing in the upper lobes but remains significant in the lower lobes. Endotracheal tube remains in good position.   Dg Chest Portable 1 View  Result Date: 03/11/2016 1. Endotracheal tube in good position. 2. Advanced airspace disease, bilaterality favoring pulmonary edema. Aspiration/pneumonia is also considered given history of vomiting.  Dg Knee Right Port Result Date: 03/13/2016 No fracture or dislocation.   Dg Tibia/fibula Right Port Result Date: 03/13/2016 1. No acute bone abnormality. 2. Soft tissue swelling.   Dg Abd Portable 1 View Result Date: 03/11/2016 Orogastric tube tip and side port in stomach. Visualized bowel gas pattern unremarkable.    Scheduled Meds: . amLODipine  10 mg Oral Daily  . atorvastatin  80 mg Oral q1800  . cloNIDine  0.1 mg Oral TID  . darbepoetin (ARANESP) injection - DIALYSIS  150 mcg Intravenous Q Tue-HD  . feeding supplement (NEPRO CARB STEADY)  237 mL Oral QHS  . [START ON 03/22/2016] ferric gluconate (FERRLECIT/NULECIT) IV  125 mg Intravenous Q T,Th,Sa-HD  . fluticasone  1 spray Each Nare Daily  . gabapentin  300 mg Oral QHS  . hydrALAZINE  100 mg Oral Q8H  . isosorbide mononitrate  30 mg Oral Daily  . labetalol  200 mg Oral BID  . levothyroxine  75 mcg Oral QAC breakfast  . loratadine  10 mg Oral Daily  . montelukast  10 mg Oral QHS  . predniSONE  5 mg Oral Q breakfast  . Warfarin - Pharmacist Dosing Inpatient   Does not apply q1800   Continuous Infusions: .  heparin 1,900 Units/hr (03/19/16 0344)     LOS: 8 days    Time spent:  15 minutes  Greater than 50% of the time spent on counseling and coordinating the care.   Manson Passey, MD [facility] Pager [phone]  If 7PM-7AM, please contact night-coverage [email or web address] Password [email or web address] 03/19/2016, 9:11 AM

## 2016-03-19 NOTE — Evaluation (Signed)
Physical Therapy Evaluation Patient Details Name: Valerie Perez MRN: [id number] DOB: [birth year] Today's Date: 03/19/2016   History of Present Illness  Pt is a 56 y/o female admitted from home via EMS secondary to weakness. In ED, pt went into V-fib arrest and coded a second time in ED. Pt was intubated from 03/11/16 to 03/13/16. PMH including but not limited to DM, HTN, renal disorder s/p kidney transplant in 2000 (failed last year).  Clinical Impression  Pt presented sitting EOB with RN when PT entered room. Pt was very eager to participate in therapy session. Prior to admission, pt reported that she was independent with all functional mobility and ADLs. Pt currently requires min A for bed mobility, min guard for transfers and min guard for ambulation with one HHA and pushing IV pole. Pt would be more stable with gait if RW was used. Pt reported that she has a RW at home if necessary. All VSS throughout. Pt would continue to benefit from skilled physical therapy services at this time while admitted and after d/c to address her below listed limitations in order to improve her overall safety and independence with functional mobility.      Follow Up Recommendations Home health PT;Supervision for mobility/OOB    Equipment Recommendations  None recommended by PT    Recommendations for Other Services       Precautions / Restrictions Precautions Precautions: Fall Restrictions Weight Bearing Restrictions: No      Mobility  Bed Mobility Overal bed mobility: Needs Assistance Bed Mobility: Sit to Supine       Sit to supine: Min assist   General bed mobility comments: pt required increased time, min A to bring LEs up onto bed  Transfers Overall transfer level: Needs assistance Equipment used: 1 person hand held assist Transfers: Sit to/from Stand Sit to Stand: Min assist         General transfer comment: pt required increased time, min A for stability upon  standing  Ambulation/Gait Ambulation/Gait assistance: Min guard Ambulation Distance (Feet): 100 Feet Assistive device: 1 person hand held assist (pushed IV pole) Gait Pattern/deviations: Step-through pattern;Decreased step length - right;Decreased step length - left;Decreased stride length Gait velocity: decreased Gait velocity interpretation: Below normal speed for age/gender General Gait Details: pt with mild instability with ambulation but no LOB or need for physical assistance, min guard for safety.   Stairs            Wheelchair Mobility    Modified Rankin (Stroke Patients Only)       Balance Overall balance assessment: Needs assistance Sitting-balance support: Feet supported Sitting balance-Leahy Scale: Good     Standing balance support: During functional activity;Single extremity supported Standing balance-Leahy Scale: Poor Standing balance comment: pt reliant on at least one UE support on a stable surface                             Pertinent Vitals/Pain Pain Assessment: Faces Faces Pain Scale: Hurts little more Pain Location: R knee and chest area Pain Descriptors / Indicators: Sore Pain Intervention(s): Monitored during session;Repositioned    Home Living Family/patient expects to be discharged to:: Private residence Living Arrangements: Spouse/significant other Available Help at Discharge: Family;Available PRN/intermittently Type of Home: House Home Access: Stairs to enter Entrance Stairs-Rails: None Entrance Stairs-Number of Steps: 1 Home Layout: Able to live on main level with bedroom/bathroom Home Equipment: Walker - 2 wheels;Cane - single point  Prior Function Level of Independence: Independent               Hand Dominance        Extremity/Trunk Assessment   Upper Extremity Assessment Upper Extremity Assessment: Overall WFL for tasks assessed    Lower Extremity Assessment Lower Extremity Assessment: Generalized  weakness    Cervical / Trunk Assessment Cervical / Trunk Assessment: Normal  Communication   Communication: No difficulties  Cognition Arousal/Alertness: Awake/alert Behavior During Therapy: WFL for tasks assessed/performed Overall Cognitive Status: Within Functional Limits for tasks assessed                      General Comments      Exercises     Assessment/Plan    PT Assessment Patient needs continued PT services  PT Problem List Decreased strength;Decreased activity tolerance;Decreased balance;Decreased mobility;Decreased coordination;Decreased knowledge of use of DME;Decreased safety awareness;Cardiopulmonary status limiting activity;Pain          PT Treatment Interventions DME instruction;Gait training;Stair training;Functional mobility training;Therapeutic activities;Therapeutic exercise;Balance training;Neuromuscular re-education;Patient/family education    PT Goals (Current goals can be found in the Care Plan section)  Acute Rehab PT Goals Patient Stated Goal: return home ASAP PT Goal Formulation: With patient Time For Goal Achievement: 04/02/16 Potential to Achieve Goals: Good    Frequency Min 3X/week   Barriers to discharge        Co-evaluation               End of Session Equipment Utilized During Treatment: Gait belt Activity Tolerance: Patient tolerated treatment well Patient left: in bed;with call bell/phone within reach Nurse Communication: Mobility status         Time: 1440-1500 PT Time Calculation (min) (ACUTE ONLY): 20 min   Charges:   PT Evaluation $PT Eval Moderate Complexity: 1 Procedure     PT G CodesAlessandra Bevels:        Aneyah Lortz M [place]on Vieau 03/19/2016, 5:08 PM Deborah ChalkJennifer Vestal Markin, PT, DPT [phone]

## 2016-03-19 NOTE — Progress Notes (Signed)
ANTICOAGULATION CONSULT NOTE - Follow Up Consult  Pharmacy Consult for heparin w/ bridge to warfarin Indication: New onset RLE DVT found 1/18  Allergies  Allergen Reactions  . Tape Other (See Comments)    Tears skin  . 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    Patient Measurements: Height: 5\' 5"  (165.1 cm) Weight: 224 lb 6.9 oz (101.8 kg) IBW/kg (Calculated) : 57 Heparin Dosing Weight: 82 kg  Vital Signs: Temp: 99 F (37.2 C) (01/23 2020) Temp Source: Oral (01/23 2020) BP: 136/51 (01/23 2020) Pulse Rate: 66 (01/23 2020)  Labs:  Recent Labs  03/16/16 0539 03/17/16 0824 03/18/16 0500 03/19/16 0255  HGB 8.4* 8.7* 7.5* 8.3*  HCT 27.4* 28.0* 23.5* 26.0*  PLT 196 256 265 349  LABPROT  --   --   --  15.1  INR  --   --   --  1.18  HEPARINUNFRC  --  0.36 0.35 0.21*  CREATININE 3.71* 6.47* 8.27*  --     Estimated Creatinine Clearance: 8.9 mL/min (by C-G formula based on SCr of 8.27 mg/dL (H)). Patient is on HD   Assessment: Pt is a [age] YOF admitted for hyperkalemia and cardiac arrest. On 1/18 pt developed a RLE DVT and was subsequently started on heparin. Heparin level down to subtherapeutic at 0.21 on 1700 units/hr. H/H low but stable. Plt wnl. No issues with infusion per RN. No bleeding reported per RN.  Goal of Therapy:  INR 2-3 Heparin level 0.3-0.7 units/ml Monitor platelets by anticoagulation protocol: Yes   Plan:  Increase heparin to 1900 units/hr F/u 8 hr heparin level  Christoper Fabianaron Almena Hokenson, PharmD, BCPS Clinical pharmacist, pager [phone] 03/19/2016,3:39 AM

## 2016-03-19 NOTE — Progress Notes (Signed)
[facility] Progress Note   Subjective: wants to work w PT, they haven't come by yet.    Vitals:   03/19/16 1200 03/19/16 1300 03/19/16 1340 03/19/16 1400  BP:   (!) 166/65   Pulse: 76 74 75 76  Resp: (!) 23 (!) 25 18 (!) 23  Temp:      TempSrc:      SpO2: 94% 95% 93% 92%  Weight:      Height:        Inpatient medications: . amLODipine  10 mg Oral Daily  . atorvastatin  80 mg Oral q1800  . cloNIDine  0.1 mg Oral TID  . darbepoetin (ARANESP) injection - DIALYSIS  150 mcg Intravenous Q Tue-HD  . feeding supplement (NEPRO CARB STEADY)  237 mL Oral QHS  . [START ON 03/22/2016] ferric gluconate (FERRLECIT/NULECIT) IV  125 mg Intravenous Q T,Th,Sa-HD  . fluticasone  1 spray Each Nare Daily  . gabapentin  300 mg Oral QHS  . hydrALAZINE  100 mg Oral Q8H  . isosorbide mononitrate  30 mg Oral Daily  . labetalol  200 mg Oral BID  . levothyroxine  75 mcg Oral QAC breakfast  . loratadine  10 mg Oral Daily  . montelukast  10 mg Oral QHS  . predniSONE  5 mg Oral Q breakfast  . warfarin  7.5 mg Oral ONCE-1800  . Warfarin - Pharmacist Dosing Inpatient   Does not apply q1800   . heparin 1,900 Units/hr (03/19/16 1400)   acetaminophen **OR** acetaminophen, white petrolatum  Exam: Alert, obese WF in no distress No jvd Chest clear bilat RRR no mrg Abd soft ntnd obese, healing open wound SP area, no drainage Ext +edema LE's Neuro A & Ox 3  Dialysis: TTS [facility] 3.5h   103kg  2/2 bath  AVF / TDC  Hep 3000  - M200 last 1/9  - Calc 1 ug tiw      Assessment: 1. Cardiac arrest (Vfib/ PEA/ hyperkalemia severe) - sp cooling protocol. Recovered well. Heart cath no sig CAD.  Should be ready to transfer out of SDU - cause of arrest was most likely severe hyperkalemia and heart cath showed no sig disease.  K is normal now.  2. Hyperkalemia - high K on admission. DC"d lisinopril with hx high K+. K+teaching done by renal team.  3. ESRD HD TTS, sp CRRT 4. Volume - is 5 kg under dry  wt 5. HTN - bp's still high, lowering dry wt, cont meds (norvasc/ clon/ labet/ hydral) 6. 2HPTH - stable 7. DM 8. Hx failed renal tx 9. Debility - PT to eval today  Plan - HD today off schedule (our mistake), will not need another HD until Saturday, if still here.   Vinson Moselleob Marlo Arriola MD [facility] pager [phone]   03/19/2016, 2:58 PM    Recent Labs Lab 03/14/16 0328 03/14/16 1840 03/15/16 0335  03/17/16 0824 03/18/16 0500 03/19/16 0255  NA 138 138 138  < > 137 136 134*  K 4.4 4.5 4.3  < > 4.1 4.2 4.1  CL 104 103 103  < > 102 102 96*  CO2 27 25 25   < > 21* 21* 26  GLUCOSE 117* 118* 103*  < > 81 85 117*  BUN 19 17 29*  < > 49* 71* 42*  CREATININE 2.40* 2.43* 3.37*  < > 6.47* 8.27* 5.32*  CALCIUM 8.1* 8.6* 8.7*  < > 9.0 8.8* 9.0  PHOS 3.8 4.2 5.3*  --   --   --   --   < > =  values in this interval not displayed.  Recent Labs Lab 03/14/16 0328 03/14/16 1840 03/15/16 0335  ALBUMIN 2.7* 2.8* 2.7*    Recent Labs Lab 03/17/16 0824 03/18/16 0500 03/19/16 0255  WBC 10.2 9.2 8.6  HGB 8.7* 7.5* 8.3*  HCT 28.0* 23.5* 26.0*  MCV 89.7 88.3 88.1  PLT 256 265 349   Iron/TIBC/Ferritin/ %Sat    Component Value Date/Time   IRON 15 (L) 03/18/2016 0930   TIBC 270 03/18/2016 0930   IRONPCTSAT 6 (L) 03/18/2016 0930

## 2016-03-19 NOTE — Progress Notes (Signed)
ANTICOAGULATION CONSULT NOTE - Follow Up Consult  Pharmacy Consult for heparin w/ bridge to warfarin Indication: New onset RLE DVT found 1/18  Allergies  Allergen Reactions  . Tape Other (See Comments)    Tears skin  . 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 Patient is on HD    Assessment: [age] YOF admitted 1/16 s/p cardiac arrest. On 1/18 pt developed a RLE DVT and was subsequently started on heparin bridge to warfarin. Heparin level was subtherapeutic this morning and rate was increased. INR subtherapeutic at 1.18 after 5 mg dose. Hgb low stable, Plt wnl, no bleeding reported per RN.  Goal of Therapy:  INR 2-3 Heparin level 0.3-0.7 units/ml Monitor platelets by anticoagulation protocol: Yes   Plan:  Continue heparin 1900 units/hr Warfarin 7.5 mg PO x1 Daily CBC/heparin level/INR Monitor for s/sx bleeding   Mackie Paienee Kirby Argueta, PharmD PGY1 Pharmacy Resident Pager: [phone] 03/19/2016 1:48 PM

## 2016-03-20 DIAGNOSIS — I824Z9 Acute embolism and thrombosis of unspecified deep veins of unspecified distal lower extremity: Secondary | ICD-10-CM

## 2016-03-20 LAB — BASIC METABOLIC PANEL
Anion gap: 12 (ref 5–15)
BUN: 35 mg/dL — AB (ref 6–20)
CHLORIDE: 95 mmol/L — AB (ref 101–111)
CO2: 26 mmol/L (ref 22–32)
CREATININE: 4.13 mg/dL — AB (ref 0.44–1.00)
Calcium: 9.2 mg/dL (ref 8.9–10.3)
GFR calc Af Amer: 13 mL/min — ABNORMAL LOW (ref >60)
GFR calc non Af Amer: 11 mL/min — ABNORMAL LOW (ref >60)
GLUCOSE: 144 mg/dL — AB (ref 65–99)
POTASSIUM: 4.3 mmol/L (ref 3.5–5.1)
SODIUM: 133 mmol/L — AB (ref 135–145)

## 2016-03-20 LAB — GLUCOSE, CAPILLARY
GLUCOSE-CAPILLARY: 101 mg/dL — AB (ref 65–99)
Glucose-Capillary: 115 mg/dL — ABNORMAL HIGH (ref 65–99)
Glucose-Capillary: 138 mg/dL — ABNORMAL HIGH (ref 65–99)
Glucose-Capillary: 77 mg/dL (ref 65–99)

## 2016-03-20 LAB — CBC
HCT: 26.5 % — ABNORMAL LOW (ref 36.0–46.0)
Hemoglobin: 8.5 g/dL — ABNORMAL LOW (ref 12.0–15.0)
MCH: 28.3 pg (ref 26.0–34.0)
MCHC: 32.1 g/dL (ref 30.0–36.0)
MCV: 88.3 fL (ref 78.0–100.0)
Platelets: 417 10*3/uL — ABNORMAL HIGH (ref 150–400)
RBC: 3 MIL/uL — AB (ref 3.87–5.11)
RDW: 17.5 % — ABNORMAL HIGH (ref 11.5–15.5)
WBC: 8.9 10*3/uL (ref 4.0–10.5)

## 2016-03-20 LAB — HEPARIN LEVEL (UNFRACTIONATED)
HEPARIN UNFRACTIONATED: 0.44 [IU]/mL (ref 0.30–0.70)
Heparin Unfractionated: 0.27 IU/mL — ABNORMAL LOW (ref 0.30–0.70)
Heparin Unfractionated: 0.55 IU/mL (ref 0.30–0.70)

## 2016-03-20 LAB — PROTIME-INR
INR: 1.14
Prothrombin Time: 14.7 seconds (ref 11.4–15.2)

## 2016-03-20 MED ORDER — LORATADINE 10 MG PO TABS
10.0000 mg | ORAL_TABLET | ORAL | Status: DC
  Administered 2016-03-22 – 2016-03-24 (×3): 10 mg via ORAL
  Filled 2016-03-20 (×3): qty 1

## 2016-03-20 MED ORDER — WARFARIN SODIUM 10 MG PO TABS
10.0000 mg | ORAL_TABLET | Freq: Once | ORAL | Status: AC
  Administered 2016-03-20: 10 mg via ORAL
  Filled 2016-03-20: qty 1

## 2016-03-20 NOTE — Progress Notes (Signed)
ANTICOAGULATION CONSULT NOTE - Follow Up Consult  Pharmacy Consult for heparin w/ bridge to warfarin Indication: New onset RLE DVT found 1/18  Allergies  Allergen Reactions  . Tape Other (See Comments)    Tears skin  . 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    Patient Measurements: Height: 5\' 5"  (165.1 cm) Weight: 217 lb 2.5 oz (98.5 kg) IBW/kg (Calculated) : 57 Heparin Dosing Weight: 82 kg  Vital Signs: Temp: 98.1 F (36.7 C) (01/25 1110) Temp Source: Oral (01/25 1110) BP: 161/63 (01/25 1110) Pulse Rate: 63 (01/25 1110)  Labs:  Recent Labs  03/18/16 0500 03/19/16 0255 03/19/16 1613 03/20/16 0014 03/20/16 1221  HGB 7.5* 8.3* 9.0* 8.5*  --   HCT 23.5* 26.0* 28.4* 26.5*  --   PLT 265 349 413* 417*  --   LABPROT  --  15.1  --  14.7  --   INR  --  1.18  --  1.14  --   HEPARINUNFRC 0.35 0.21* 0.14* 0.27* 0.55  CREATININE 8.27* 5.32*  --  4.13*  --     Estimated Creatinine Clearance: 17.5 mL/min (by C-G formula based on SCr of 4.13 mg/dL (H)). on HD  Assessment: [age] YOF admitted 1/16 s/p cardiac arrest. On 1/18 pt developed a RLE DVT and was subsequently started on heparin bridge to warfarin. Heparin level was subtherapeutic this morning at 0.27 and rate was increased to 2200 units/hr. Heparin level is now therapeutic at 0.55 at a rate of 2200 units/hr.   INR subtherapeutic at 1.14 (down from 1.18) after 5 mg then 7.5 mg dose. H/H low stable, Plt elevated at 417, no bleeding reported per RN.  Goal of Therapy:  INR 2-3  Heparin level 0.3-0.7 Monitor platelets by anticoagulation protocol: Yes   Plan:  Continue heparin gtt 2200 units/hr Warfarin 10 mg x1 F/u 8h heparin level Daily heparin level/CBC/INR Monitor for s/sx bleeding  Lianne CureJustin R Shalva Rozycki, PharmD Candidate 03/20/2016,12:56 PM

## 2016-03-20 NOTE — Progress Notes (Signed)
ANTICOAGULATION CONSULT NOTE - Follow Up Consult  Pharmacy Consult for heparin w/ bridge to warfarin Indication: New onset RLE DVT found 1/18  Allergies  Allergen Reactions  . Tape Other (See Comments)    Tears skin  . 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    Patient Measurements: Height: 5\' 5"  (165.1 cm) Weight: 217 lb 2.5 oz (98.5 kg) IBW/kg (Calculated) : 57 Heparin Dosing Weight: 82 kg  Vital Signs: Temp: 97.6 F (36.4 C) (01/24 2350) Temp Source: Oral (01/24 2350) BP: 135/48 (01/24 2350) Pulse Rate: 63 (01/24 2350)  Labs:  Recent Labs  03/18/16 0500 03/19/16 0255 03/19/16 1613 03/20/16 0014  HGB 7.5* 8.3* 9.0* 8.5*  HCT 23.5* 26.0* 28.4* 26.5*  PLT 265 349 413* 417*  LABPROT  --  15.1  --  14.7  INR  --  1.18  --  1.14  HEPARINUNFRC 0.35 0.21* 0.14* 0.27*  CREATININE 8.27* 5.32*  --  4.13*    Estimated Creatinine Clearance: 17.5 mL/min (by C-G formula based on SCr of 4.13 mg/dL (H)). Patient is on HD    Assessment: Valerie Perez admitted 1/16 s/p cardiac arrest. On 1/18 pt developed a RLE DVT and was subsequently started on heparin bridge to warfarin. INR subtherapeutic at 1.14.   Heparin level remains slightly subtherapeutic (0.27). No issues with line or bleeding reported per RN. Hgb low but stable, plt elevated.  Goal of Therapy:  INR 2-3 Heparin level 0.3-0.7 units/ml Monitor platelets by anticoagulation protocol: Yes   Plan:  Increase heparin to 2200 units/hr 8h heparin level  Christoper Fabianaron Shakara Tweedy, PharmD, BCPS Clinical pharmacist, pager [phone] 03/20/2016 1:44 AM

## 2016-03-20 NOTE — Progress Notes (Signed)
PROGRESS NOTE    Valerie Perez  ZOX:[id number] DOB: 12-Apr-1960 DOA: 03/11/2016 PCP: Pcp Not In System   Brief Narrative: 56 year old female with end-stage renal disease on  Hemodialysis, suffered VF/PEA cardiac arrest for in total just over 45 minutes. Potassium found to be greater than 7.5. Pt was admitted to the pulmonary critical care team with plans for normothermia protocol due to prolonged downtime, fluctuant abnormalities, refractory shock.Now with RLE DVT, on coumadin, bridging with IV heparin.  SIGNIFICANT EVENTS: 1/16 Cardiac arrest 1/18 Extubated.  Assessment & Plan: # Acute hypoxemic hypercarbic respiratory failure secondary to aspiration pneumonia and cardiac arrest  - Stable respiratory status - Extubated 1/18  -currently on room air, stable  # Cardiogenic shock secondary to cardiac arrest / Elevated troponin  - In the setting of hyperkalemia in pt with ESRD. 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 - on HD as per nephrology.   # Chronic Abdominal Wound - Seen by wound care- dressing in place, shallow and healing, well not infected  # R Popliteal DVT - On heparin drip, started Coumadin 03/18/2016 - INR subtherapeutic. Continue heparin iv and coumadin oral. 10 mg today   # Possible Aspiration PNA - Completed 7 days on Augmentin 03/17/16   # Acute encephalopathy, unspecified - Had confusion on 1/19 - improved now.    # Hypertensive urgency - Continue clonidine, hydralazine, Imdur, labetalol and Norvasc  --monitor Bp. -mostly volume mediated in the patient with ESRD  # Dyslipidemia - Continue Lipitor   #Hypothyroidism - Continue synthroid   #OSA on CPAP - Stable    Principal Problem:   Cardiac arrest (HCC) Active Problems:   Obstructive sleep  apnea   KIDNEY TRANSPLANTATION, HX OF   Essential hypertension   Hypothyroidism   Immunosuppression (HCC)   End stage renal disease on dialysis (HCC)   Elevated troponin   Aspiration pneumonia (HCC)  DVT prophylaxis: Systemic anticoagulation Code Status: Full code Family Communication: No family present at bedside Disposition Plan: Likely discharge home in 1-2 days. PT OT evaluation. Needs INR therapeutic.   Consultants:   Cardiologist  Nephrologist  Procedures: Indication Hemodialysis Antimicrobials:  Zosyn 1/16 >> 1/19  Vancomycin 1/16 >> 1/19  Augmentin 1/19 >> 1/22 Subjective: Patient was seen and examined at bedside. Patient reported generalized weakness. Denied headache, dizziness, nausea vomiting and chest pain or shortness of breath.  Objective: Vitals:   03/20/16 0343 03/20/16 0519 03/20/16 0741 03/20/16 1110  BP: (!) 170/59 (!) 150/69 (!) 185/53 (!) 161/63  Pulse: (!) 57  61 63  Resp: 17  20 18   Temp: 98.3 F (36.8 C)  97.9 F (36.6 C) 98.1 F (36.7 C)  TempSrc: Axillary  Oral Oral  SpO2: 100%  100% 97%  Weight:      Height:        Intake/Output Summary (Last 24 hours) at 03/20/16 1620 Last data filed at 03/20/16 1214  Gross per 24 hour  Intake            998.5 ml  Output               50 ml  Net            948.5 ml   Filed Weights   03/18/16 1125 03/19/16 0715 03/19/16 1110  Weight: 101.8 kg (224 lb 6.9 oz)  102.5 kg (225 lb 15.5 oz) 98.5 kg (217 lb 2.5 oz)    Examination:  General exam: Appears calm and comfortable  Respiratory system: Clear to auscultation. Respiratory effort normal. No wheezing or crackle Cardiovascular system: S1 & S2 heard, RRR.  No pedal edema. Gastrointestinal system: Abdomen is nondistended, soft and nontender. Normal bowel sounds heard. Central nervous system: Alert and oriented. No focal neurological deficits. Extremities: Symmetric 5 x 5 power. Skin: No rashes, lesions or ulcers Psychiatry: Judgement and  insight appear normal. Mood & affect appropriate.     Data Reviewed: I have personally reviewed following labs and imaging studies  CBC:  Recent Labs Lab 03/17/16 0824 03/18/16 0500 03/19/16 0255 03/19/16 1613 03/20/16 0014  WBC 10.2 9.2 8.6 10.2 8.9  HGB 8.7* 7.5* 8.3* 9.0* 8.5*  HCT 28.0* 23.5* 26.0* 28.4* 26.5*  MCV 89.7 88.3 88.1 88.5 88.3  PLT 256 265 349 413* 417*   Basic Metabolic Panel:  Recent Labs Lab 03/14/16 0328 03/14/16 0329 03/14/16 1840 03/15/16 0335 03/16/16 0539 03/17/16 0824 03/18/16 0500 03/19/16 0255 03/20/16 0014  NA 138  --  138 138 137 137 136 134* 133*  K 4.4  --  4.5 4.3 3.5 4.1 4.2 4.1 4.3  CL 104  --  103 103 103 102 102 96* 95*  CO2 27  --  25 25 26  21* 21* 26 26  GLUCOSE 117*  --  118* 103* 116* 81 85 117* 144*  BUN 19  --  17 29* 23* 49* 71* 42* 35*  CREATININE 2.40*  --  2.43* 3.37* 3.71* 6.47* 8.27* 5.32* 4.13*  CALCIUM 8.1*  --  8.6* 8.7* 8.6* 9.0 8.8* 9.0 9.2  MG  --  2.6*  --  2.6*  --   --   --   --   --   PHOS 3.8  --  4.2 5.3*  --   --   --   --   --    GFR: Estimated Creatinine Clearance: 17.5 mL/min (by C-G formula based on SCr of 4.13 mg/dL (H)). Liver Function Tests:  Recent Labs Lab 03/14/16 0328 03/14/16 1840 03/15/16 0335  ALBUMIN 2.7* 2.8* 2.7*   No results for input(s): LIPASE, AMYLASE in the last 168 hours. No results for input(s): AMMONIA in the last 168 hours. Coagulation Profile:  Recent Labs Lab 03/19/16 0255 03/20/16 0014  INR 1.18 1.14   Cardiac Enzymes: No results for input(s): CKTOTAL, CKMB, CKMBINDEX, TROPONINI in the last 168 hours. BNP (last 3 results) No results for input(s): PROBNP in the last 8760 hours. HbA1C: No results for input(s): HGBA1C in the last 72 hours. CBG:  Recent Labs Lab 03/19/16 1958 03/19/16 2353 03/20/16 0348 03/20/16 0743 03/20/16 1112  GLUCAP 168* 138* 101* 77 115*   Lipid Profile: No results for input(s): CHOL, HDL, LDLCALC, TRIG, CHOLHDL, LDLDIRECT  in the last 72 hours. Thyroid Function Tests: No results for input(s): TSH, T4TOTAL, FREET4, T3FREE, THYROIDAB in the last 72 hours. Anemia Panel:  Recent Labs  03/18/16 0930  TIBC 270  IRON 15*   Sepsis Labs: No results for input(s): PROCALCITON, LATICACIDVEN in the last 168 hours.  Recent Results (from the past 240 hour(s))  Culture, blood (Routine X 2) w Reflex to ID Panel     Status: None   Collection Time: 03/11/16 10:00 AM  Result Value Ref Range Status   Specimen Description BLOOD RIGHT FOREARM  Final   Special Requests BOTTLES DRAWN AEROBIC AND ANAEROBIC  5CC  Final  Culture NO GROWTH 5 DAYS  Final   Report Status 03/16/2016 FINAL  Final  Culture, blood (Routine X 2) w Reflex to ID Panel     Status: None   Collection Time: 03/11/16 10:50 AM  Result Value Ref Range Status   Specimen Description BLOOD RIGHT FOREARM  Final   Special Requests IN PEDIATRIC BOTTLE  2CC  Final   Culture NO GROWTH 5 DAYS  Final   Report Status 03/16/2016 FINAL  Final  MRSA PCR Screening     Status: Abnormal   Collection Time: 03/11/16  1:30 PM  Result Value Ref Range Status   MRSA by PCR POSITIVE (A) NEGATIVE Final    Comment:        The GeneXpert MRSA Assay (FDA approved for NASAL specimens only), is one component of a comprehensive MRSA colonization surveillance program. It is not intended to diagnose MRSA infection nor to guide or monitor treatment for MRSA infections. CALLED TO RN Allegra Lai BY M.WILSON          Radiology Studies: No results found.      Scheduled Meds: . amLODipine  10 mg Oral Daily  . atorvastatin  80 mg Oral q1800  . cloNIDine  0.1 mg Oral TID  . darbepoetin (ARANESP) injection - DIALYSIS  150 mcg Intravenous Q Tue-HD  . feeding supplement (NEPRO CARB STEADY)  237 mL Oral QHS  . [START ON 03/22/2016] ferric gluconate (FERRLECIT/NULECIT) IV  125 mg Intravenous Q T,Th,Sa-HD  . fluticasone  1 spray Each Nare Daily  . gabapentin  300 mg Oral QHS  .  hydrALAZINE  100 mg Oral Q8H  . isosorbide mononitrate  30 mg Oral Daily  . labetalol  200 mg Oral BID  . levothyroxine  75 mcg Oral QAC breakfast  . [START ON 03/22/2016] loratadine  10 mg Oral Q48H  . montelukast  10 mg Oral QHS  . predniSONE  5 mg Oral Q breakfast  . warfarin  10 mg Oral ONCE-1800  . Warfarin - Pharmacist Dosing Inpatient   Does not apply q1800   Continuous Infusions: . heparin 2,200 Units/hr (03/20/16 0616)     LOS: 9 days    Dron Jaynie Collins, MD [facility] Pager [phone]  If 7PM-7AM, please contact night-coverage [email or web address] Password [email or web address] 03/20/2016, 4:20 PM

## 2016-03-20 NOTE — Progress Notes (Signed)
ANTICOAGULATION CONSULT NOTE - Follow Up Consult  Pharmacy Consult for heparin w/ bridge to warfarin Indication: New onset RLE DVT found 1/18  Allergies  Allergen Reactions  . Tape Other (See Comments)    Tears skin  . 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    Patient Measurements: Height: 5\' 5"  (165.1 cm) Weight: 217 lb 2.5 oz (98.5 kg) IBW/kg (Calculated) : 57 Heparin Dosing Weight: 82 kg  Vital Signs: Temp: 98.2 F (36.8 C) (01/25 1900) Temp Source: Oral (01/25 1900) BP: 171/59 (01/25 2014) Pulse Rate: 61 (01/25 2014)  Labs:  Recent Labs  03/18/16 0500 03/19/16 0255 03/19/16 1613 03/20/16 0014 03/20/16 1221 03/20/16 2047  HGB 7.5* 8.3* 9.0* 8.5*  --   --   HCT 23.5* 26.0* 28.4* 26.5*  --   --   PLT 265 349 413* 417*  --   --   LABPROT  --  15.1  --  14.7  --   --   INR  --  1.18  --  1.14  --   --   HEPARINUNFRC 0.35 0.21* 0.14* 0.27* 0.55 0.44  CREATININE 8.27* 5.32*  --  4.13*  --   --     Estimated Creatinine Clearance: 17.5 mL/min (by C-G formula based on SCr of 4.13 mg/dL (H)). Patient is on HD    Assessment: [age] YOF admitted 1/16 s/p cardiac arrest. On 1/18 pt developed a RLE DVT and was subsequently started on heparin bridge to warfarin. INR subtherapeutic at 1.14.   Heparin level remains therapeutic (0.44). No issues with bleeding reported. CBC stable.  Goal of Therapy:  INR 2-3 Heparin level 0.3-0.7 units/ml Monitor platelets by anticoagulation protocol: Yes   Plan:  Heparin at 2200 units/hr Daily heparin level/CBC Monitor for s/sx bleeding   Babs BertinHaley Saree Krogh, PharmD, BCPS Clinical Pharmacist 03/20/2016 9:26 PM

## 2016-03-20 NOTE — Progress Notes (Signed)
[facility] Progress Note   Subjective: walked to front dest with PT this am or yest.  Tired today.   Vitals:   03/20/16 0343 03/20/16 0519 03/20/16 0741 03/20/16 1110  BP: (!) 170/59 (!) 150/69 (!) 185/53 (!) 161/63  Pulse: (!) 57  61 63  Resp: 17  20 18   Temp: 98.3 F (36.8 C)  97.9 F (36.6 C) 98.1 F (36.7 C)  TempSrc: Axillary  Oral Oral  SpO2: 100%  100% 97%  Weight:      Height:        Inpatient medications: . amLODipine  10 mg Oral Daily  . atorvastatin  80 mg Oral q1800  . cloNIDine  0.1 mg Oral TID  . darbepoetin (ARANESP) injection - DIALYSIS  150 mcg Intravenous Q Tue-HD  . feeding supplement (NEPRO CARB STEADY)  237 mL Oral QHS  . [START ON 03/22/2016] ferric gluconate (FERRLECIT/NULECIT) IV  125 mg Intravenous Q T,Th,Sa-HD  . fluticasone  1 spray Each Nare Daily  . gabapentin  300 mg Oral QHS  . hydrALAZINE  100 mg Oral Q8H  . isosorbide mononitrate  30 mg Oral Daily  . labetalol  200 mg Oral BID  . levothyroxine  75 mcg Oral QAC breakfast  . [START ON 03/22/2016] loratadine  10 mg Oral Q48H  . montelukast  10 mg Oral QHS  . predniSONE  5 mg Oral Q breakfast  . warfarin  10 mg Oral ONCE-1800  . Warfarin - Pharmacist Dosing Inpatient   Does not apply q1800   . heparin 2,200 Units/hr (03/20/16 [id number])   acetaminophen **OR** acetaminophen, white petrolatum  Exam: Alert, obese WF in no distress No jvd Chest clear bilat RRR no mrg Abd soft ntnd obese, healing open wound SP area, no drainage Ext +edema LE's Neuro A & Ox 3  Dialysis: TTS [facility] 3.5h   103kg  2/2 bath  AVF / TDC  Hep 3000  - M200 last 1/9  - Calc 1 ug tiw      Assessment: 1. Cardiac arrest (Vfib/ PEA/ hyperkalemia severe) - sp cooling protocol. Recovered well. Heart cath no sig CAD.   2. Hyperkalemia - high K on admit. DC"d lisinopril with hx high K+.  3. ESRD HD TTS, sp CRRT 4. Volume - is 5 kg under dry wt 5. HTN - bp's still high, lowering dry wt, cont meds (norvasc/  clon/ labet/ hydral) 6. 2HPTH - stable 7. DM 8. Hx failed renal tx 9. Debility - PT to eval today  Plan - next HD Saturday   Vinson Moselleob Rakhi Romagnoli MD [facility] pager [phone]   03/20/2016, 1:22 PM    Recent Labs Lab 03/14/16 0328 03/14/16 1840 03/15/16 0335  03/18/16 0500 03/19/16 0255 03/20/16 0014  NA 138 138 138  < > 136 134* 133*  K 4.4 4.5 4.3  < > 4.2 4.1 4.3  CL 104 103 103  < > 102 96* 95*  CO2 27 25 25   < > 21* 26 26  GLUCOSE 117* 118* 103*  < > 85 117* 144*  BUN 19 17 29*  < > 71* 42* 35*  CREATININE 2.40* 2.43* 3.37*  < > 8.27* 5.32* 4.13*  CALCIUM 8.1* 8.6* 8.7*  < > 8.8* 9.0 9.2  PHOS 3.8 4.2 5.3*  --   --   --   --   < > = values in this interval not displayed.  Recent Labs Lab 03/14/16 0328 03/14/16 1840 03/15/16 0335  ALBUMIN 2.7*  2.8* 2.7*    Recent Labs Lab 03/19/16 0255 03/19/16 1613 03/20/16 0014  WBC 8.6 10.2 8.9  HGB 8.3* 9.0* 8.5*  HCT 26.0* 28.4* 26.5*  MCV 88.1 88.5 88.3  PLT 349 413* 417*   Iron/TIBC/Ferritin/ %Sat    Component Value Date/Time   IRON 15 (L) 03/18/2016 0930   TIBC 270 03/18/2016 0930   IRONPCTSAT 6 (L) 03/18/2016 0930

## 2016-03-21 ENCOUNTER — Inpatient Hospital Stay (HOSPITAL_COMMUNITY): Payer: Commercial Managed Care - PPO

## 2016-03-21 DIAGNOSIS — E038 Other specified hypothyroidism: Secondary | ICD-10-CM

## 2016-03-21 DIAGNOSIS — G4733 Obstructive sleep apnea (adult) (pediatric): Secondary | ICD-10-CM

## 2016-03-21 DIAGNOSIS — Z940 Kidney transplant status: Secondary | ICD-10-CM

## 2016-03-21 LAB — PROTIME-INR
INR: 1.27
Prothrombin Time: 16 seconds — ABNORMAL HIGH (ref 11.4–15.2)

## 2016-03-21 LAB — HEPARIN LEVEL (UNFRACTIONATED): HEPARIN UNFRACTIONATED: 0.42 [IU]/mL (ref 0.30–0.70)

## 2016-03-21 MED ORDER — POLYETHYLENE GLYCOL 3350 17 G PO PACK
17.0000 g | PACK | Freq: Every day | ORAL | Status: DC
  Administered 2016-03-21 – 2016-03-25 (×4): 17 g via ORAL
  Filled 2016-03-21 (×5): qty 1

## 2016-03-21 MED ORDER — WARFARIN SODIUM 10 MG PO TABS
10.0000 mg | ORAL_TABLET | Freq: Once | ORAL | Status: AC
  Administered 2016-03-21: 10 mg via ORAL
  Filled 2016-03-21: qty 1

## 2016-03-21 MED ORDER — OXYCODONE HCL 5 MG PO TABS
5.0000 mg | ORAL_TABLET | ORAL | Status: AC | PRN
  Administered 2016-03-22 (×3): 5 mg via ORAL
  Filled 2016-03-21 (×2): qty 1

## 2016-03-21 NOTE — Progress Notes (Addendum)
PROGRESS NOTE    Valerie Perez  ZOX:[id number] DOB: 1960/03/12 DOA: 03/11/2016 PCP: Pcp Not In System   Chief Complaint  Patient presents with  . Cardiac Arrest     Brief Narrative:  56 year old female with end-stage renal disease on Hemodialysis, suffered VF/PEA cardiac arrest for in total just over 45 minutes. Potassium found to be greater than 7.5. Pt was admitted to the pulmonary critical care team with plans for normothermia protocol due to prolonged downtime, fluctuant abnormalities, refractory shock.Now with RLE DVT, on coumadin, bridging with IV heparin.  Assessment & Plan   Acute hypoxemic hypercarbic respiratory failure  -secondary to aspiration pneumonia and cardiac arrest  -Stable respiratory status -Extubated 1/18  -currently on room air, stable  Cardiogenic shock secondary to cardiac arrest / Elevated troponin  -In the setting of hyperkalemia in pt with ESRD. 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 -Nephrology consulted and appreciated -potassium WNL  Chronic Abdominal Wound -Seen by wound care- dressing in place, shallow and healing, well not infected  R Popliteal DVT -On heparin drip,started Coumadin 03/18/2016 -INR subtherapeuticm 1.27  Possible Aspiration PNA -Completed 7 days on Augmentin 03/17/16   Acute encephalopathy -Had confusion on 1/19 -Appears to be at baseline today  Hypertensive urgency -Continueclonidine, hydralazine, Imdur, labetalol and Norvasc  -mostly volume mediated in the patient with ESRD  Dyslipidemia -Continue Lipitor   Hypothyroidism -Continue synthroid   OSA on CPAP -Stable   Deconditioning -PT consulted recommended Home health  DVT Prophylaxis  Heparin/coumadin  Code Status:  Full  Family Communication: None at bedside.  Disposition Plan: Admitted. Pending therapeutic INR  Consultants Cardiology PCCM Nephrology   Procedures / Significant Events 1/16 Cardiac arrest 1/16 Echocardiogram EF60-65% 1/18 LE doppler 1/18 Extubated 1/19 LHC  Antibiotics   Anti-infectives    Start     Dose/Rate Route Frequency Ordered Stop   03/14/16 2200  amoxicillin-clavulanate (AUGMENTIN) 500-125 MG per tablet 500 mg     1 tablet Oral Daily 03/14/16 0930 03/17/16 2251   03/11/16 1800  piperacillin-tazobactam (ZOSYN) IVPB 3.375 g  Status:  Discontinued    Comments:  CRRT dosing   3.375 g 100 mL/hr over 30 Minutes Intravenous Every 6 hours 03/11/16 1615 03/14/16 0928   03/11/16 1700  vancomycin (VANCOCIN) IVPB 1000 mg/200 mL premix  Status:  Discontinued     1,000 mg 200 mL/hr over 60 Minutes Intravenous Every 24 hours 03/11/16 1615 03/14/16 0928   03/11/16 0915  vancomycin (VANCOCIN) IVPB 1000 mg/200 mL premix     1,000 mg 200 mL/hr over 60 Minutes Intravenous  Once 03/11/16 0904 03/11/16 1211   03/11/16 0915  piperacillin-tazobactam (ZOSYN) IVPB 4.5 g     4.5 g 200 mL/hr over 30 Minutes Intravenous  Once 03/11/16 0904 03/11/16 1135      Subjective:   Valerie Perez seen and examined today.  Patient states she feels better today. Has some right arm pain and chest pain. Denies shortness of breath, abdominal pain, nausea or vomiting, headache, dizziness.    Objective:   Vitals:   03/21/16 0600 03/21/16 0801 03/21/16 0900 03/21/16 1145  BP: 137/62 (!) 158/64  (!) 165/58  Pulse: (!) 54 (!) 57 (!) 58   Resp: 18 (!) 26 20 16   Temp:  97.6 F (36.4 C)  98.5 F (36.9 C)  TempSrc:  Axillary  Oral  SpO2: 97% 96% 99% 95%  Weight:      Height:        Intake/Output Summary (Last 24 hours) at 03/21/16 1328 Last data filed at 03/21/16 0900  Gross per 24 hour  Intake          1277.47 ml  Output                0 ml  Net          1277.47 ml   Filed Weights    03/18/16 1125 03/19/16 0715 03/19/16 1110  Weight: 101.8 kg (224 lb 6.9 oz) 102.5 kg (225 lb 15.5 oz) 98.5 kg (217 lb 2.5 oz)    Exam  General: Well developed, well nourished, NAD, appears stated age  HEENT: NCAT, mucous membranes moist.   Cardiovascular: S1 S2 auscultated, no murmurs, RRR  Respiratory: Clear to auscultation bilaterally with equal chest rise  Abdomen: Soft, obese, nontender, nondistended, + bowel sounds  Extremities: warm dry without cyanosis clubbing. Trace LE edema  Neuro: AAOx3, nonfocal  Psych: Normal affect and demeanor with intact judgement and insight   Data Reviewed: I have personally reviewed following labs and imaging studies  CBC:  Recent Labs Lab 03/17/16 0824 03/18/16 0500 03/19/16 0255 03/19/16 1613 03/20/16 0014  WBC 10.2 9.2 8.6 10.2 8.9  HGB 8.7* 7.5* 8.3* 9.0* 8.5*  HCT 28.0* 23.5* 26.0* 28.4* 26.5*  MCV 89.7 88.3 88.1 88.5 88.3  PLT 256 265 349 413* 417*   Basic Metabolic Panel:  Recent Labs Lab 03/14/16 1840 03/15/16 0335 03/16/16 0539 03/17/16 0824 03/18/16 0500 03/19/16 0255 03/20/16 0014  NA 138 138 137 137 136 134* 133*  K 4.5 4.3 3.5 4.1 4.2 4.1 4.3  CL 103 103 103 102 102 96* 95*  CO2 25 25 26  21* 21* 26 26  GLUCOSE 118* 103* 116* 81 85 117* 144*  BUN 17 29* 23* 49* 71* 42* 35*  CREATININE 2.43* 3.37* 3.71* 6.47* 8.27* 5.32* 4.13*  CALCIUM 8.6* 8.7* 8.6* 9.0 8.8* 9.0 9.2  MG  --  2.6*  --   --   --   --   --   PHOS 4.2 5.3*  --   --   --   --   --    GFR: Estimated Creatinine Clearance: 17.5 mL/min (by C-G formula based on SCr of 4.13 mg/dL (H)). Liver Function Tests:  Recent Labs Lab 03/14/16 1840 03/15/16 0335  ALBUMIN 2.8* 2.7*   No results for input(s): LIPASE, AMYLASE in the last 168 hours. No results for input(s): AMMONIA in the last 168 hours. Coagulation Profile:  Recent Labs Lab 03/19/16 0255 03/20/16 0014 03/21/16 0313  INR 1.18 1.14 1.27   Cardiac Enzymes: No results for input(s):  CKTOTAL, CKMB, CKMBINDEX, TROPONINI in the last 168 hours. BNP (last 3 results) No results for input(s): PROBNP in the last 8760 hours. HbA1C: No results for input(s): HGBA1C in the last 72 hours. CBG:  Recent Labs Lab 03/19/16 1958 03/19/16 2353 03/20/16 0348 03/20/16 0743 03/20/16 1112  GLUCAP 168* 138* 101* 77 115*   Lipid Profile: No results for input(s): CHOL, HDL, LDLCALC, TRIG, CHOLHDL, LDLDIRECT in the last 72 hours. Thyroid Function Tests: No results for input(s): TSH, T4TOTAL, FREET4, T3FREE, THYROIDAB in the last 72 hours. Anemia Panel: No results for input(s): VITAMINB12, FOLATE, FERRITIN, TIBC, IRON, RETICCTPCT in the last 72 hours. Urine analysis:    Component Value Date/Time   COLORURINE YELLOW 04/28/2015 2025   APPEARANCEUR CLEAR  04/28/2015 2025   LABSPEC 1.010 04/28/2015 2025   PHURINE 8.5 (H) 04/28/2015 2025   GLUCOSEU 250 (A) 04/28/2015 2025   HGBUR SMALL (A) 04/28/2015 2025   BILIRUBINUR NEGATIVE 04/28/2015 2025   KETONESUR NEGATIVE 04/28/2015 2025   PROTEINUR >300 (A) 04/28/2015 2025   NITRITE NEGATIVE 04/28/2015 2025   LEUKOCYTESUR NEGATIVE 04/28/2015 2025   Sepsis Labs: @LABRCNTIP (procalcitonin:4,lacticidven:4)  ) Recent Results (from the past 240 hour(s))  MRSA PCR Screening     Status: Abnormal   Collection Time: 03/11/16  1:30 PM  Result Value Ref Range Status   MRSA by PCR POSITIVE (A) NEGATIVE Final    Comment:        The GeneXpert MRSA Assay (FDA approved for NASAL specimens only), is one component of a comprehensive MRSA colonization surveillance program. It is not intended to diagnose MRSA infection nor to guide or monitor treatment for MRSA infections. CALLED TO RN Allegra Lai BY M.WILSON       Radiology Studies: No results found.   Scheduled Meds: . amLODipine  10 mg Oral Daily  . atorvastatin  80 mg Oral q1800  . cloNIDine  0.1 mg Oral TID  . darbepoetin (ARANESP) injection - DIALYSIS  150 mcg Intravenous Q Tue-HD   . feeding supplement (NEPRO CARB STEADY)  237 mL Oral QHS  . [START ON 03/22/2016] ferric gluconate (FERRLECIT/NULECIT) IV  125 mg Intravenous Q T,Th,Sa-HD  . fluticasone  1 spray Each Nare Daily  . gabapentin  300 mg Oral QHS  . hydrALAZINE  100 mg Oral Q8H  . isosorbide mononitrate  30 mg Oral Daily  . labetalol  200 mg Oral BID  . levothyroxine  75 mcg Oral QAC breakfast  . [START ON 03/22/2016] loratadine  10 mg Oral Q48H  . montelukast  10 mg Oral QHS  . predniSONE  5 mg Oral Q breakfast  . warfarin  10 mg Oral ONCE-1800  . Warfarin - Pharmacist Dosing Inpatient   Does not apply q1800   Continuous Infusions: . heparin 2,200 Units/hr (03/21/16 0450)     LOS: 10 days   Time Spent in minutes   30 minutes  Meko Masterson D.O. on 03/21/2016 at 1:28 PM  Between 7am to 7pm - Pager - [phone]  After 7pm go to [email or web address] - password [email or web address]  And look for the night coverage person covering for me after hours  [facility] Office  [phone]

## 2016-03-21 NOTE — Care Management Important Message (Signed)
Important Message  Patient Details  Name: Renella CunasKatherine C Meckley MRN: [id number] Date of Birth: 1960/04/02   Medicare Important Message Given:  Yes    Hanley Haysowell, Galit Urich T, RN 03/21/2016, 2:18 PM

## 2016-03-21 NOTE — Progress Notes (Addendum)
[facility] Progress Note   Subjective: FEELS a lot better, workign w PT, answered lots of questions about K and Phos levels, what the goal is, etc.    Vitals:   03/21/16 0600 03/21/16 0801 03/21/16 0900 03/21/16 1145  BP: 137/62 (!) 158/64  (!) 165/58  Pulse: (!) 54 (!) 57 (!) 58   Resp: 18 (!) 26 20 16   Temp:  97.6 F (36.4 C)  98.5 F (36.9 C)  TempSrc:  Axillary  Oral  SpO2: 97% 96% 99% 95%  Weight:      Height:        Inpatient medications: . amLODipine  10 mg Oral Daily  . atorvastatin  80 mg Oral q1800  . cloNIDine  0.1 mg Oral TID  . darbepoetin (ARANESP) injection - DIALYSIS  150 mcg Intravenous Q Tue-HD  . feeding supplement (NEPRO CARB STEADY)  237 mL Oral QHS  . [START ON 03/22/2016] ferric gluconate (FERRLECIT/NULECIT) IV  125 mg Intravenous Q T,Th,Sa-HD  . fluticasone  1 spray Each Nare Daily  . gabapentin  300 mg Oral QHS  . hydrALAZINE  100 mg Oral Q8H  . isosorbide mononitrate  30 mg Oral Daily  . labetalol  200 mg Oral BID  . levothyroxine  75 mcg Oral QAC breakfast  . [START ON 03/22/2016] loratadine  10 mg Oral Q48H  . montelukast  10 mg Oral QHS  . predniSONE  5 mg Oral Q breakfast  . warfarin  10 mg Oral ONCE-1800  . Warfarin - Pharmacist Dosing Inpatient   Does not apply q1800   . heparin 2,200 Units/hr (03/21/16 0450)   acetaminophen **OR** acetaminophen, white petrolatum  Exam: Alert, obese WF in no distress No jvd Chest clear bilat RRR no mrg Abd soft ntnd obese, healing open wound SP area, no drainage Ext +edema LE's Neuro A & Ox 3  Dialysis: TTS [facility] 3.5h   103kg  2/2 bath  AVF / TDC  Hep 3000  - M200 last 1/9  - Calc 1 ug tiw      Assessment: 1. Cardiac arrest (Vfib/ PEA/ hyperkalemia severe) - sp cooling protocol. Recovered well. Heart cath no sig CAD.  2. DVT leg - on IV hep, coumadin, awaiting rise in INR  3. Hyperkalemia - high K on admit. DC"d lisinopril with hx high K+. Have educated patient on > 1 occasion.   4. ESRD HD TTS, sp CRRT 5. Volume - is 5 kg under dry wt 6. HTN - bp's still high, lowering dry wt, cont meds (norvasc/ clon/ labet/ hydral) 7. 2HPTH - stable 8. DM 9. Hx failed renal tx 10. Debility - PT working w pt  Plan - HD Sat, UF to 95kg as Caryl Pinatol   Rob Candler Ginsberg MD [facility] pager [phone]   03/21/2016, 1:39 PM    Recent Labs Lab 03/14/16 1840 03/15/16 0335  03/18/16 0500 03/19/16 0255 03/20/16 0014  NA 138 138  < > 136 134* 133*  K 4.5 4.3  < > 4.2 4.1 4.3  CL 103 103  < > 102 96* 95*  CO2 25 25  < > 21* 26 26  GLUCOSE 118* 103*  < > 85 117* 144*  BUN 17 29*  < > 71* 42* 35*  CREATININE 2.43* 3.37*  < > 8.27* 5.32* 4.13*  CALCIUM 8.6* 8.7*  < > 8.8* 9.0 9.2  PHOS 4.2 5.3*  --   --   --   --   < > =  values in this interval not displayed.  Recent Labs Lab 03/14/16 1840 03/15/16 0335  ALBUMIN 2.8* 2.7*    Recent Labs Lab 03/19/16 0255 03/19/16 1613 03/20/16 0014  WBC 8.6 10.2 8.9  HGB 8.3* 9.0* 8.5*  HCT 26.0* 28.4* 26.5*  MCV 88.1 88.5 88.3  PLT 349 413* 417*   Iron/TIBC/Ferritin/ %Sat    Component Value Date/Time   IRON 15 (L) 03/18/2016 0930   TIBC 270 03/18/2016 0930   IRONPCTSAT 6 (L) 03/18/2016 0930

## 2016-03-21 NOTE — Progress Notes (Signed)
ANTICOAGULATION CONSULT NOTE - Follow Up Consult  Pharmacy Consult for heparin bridge to warfarin Indication: RLE DVT  Allergies  Allergen Reactions  . Tape Other (See Comments)    Tears skin  . 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    Patient Measurements: Height: 5\' 5"  (165.1 cm) Weight: 217 lb 2.5 oz (98.5 kg) IBW/kg (Calculated) : 57 Heparin Dosing Weight: 82 kg  Vital Signs: Temp: 97.9 F (36.6 C) (01/26 0353) Temp Source: Oral (01/26 0353) BP: 137/62 (01/26 0600) Pulse Rate: 54 (01/26 0600)  Labs:  Recent Labs  03/19/16 0255 03/19/16 1613 03/20/16 0014 03/20/16 1221 03/20/16 2047 03/21/16 0313  HGB 8.3* 9.0* 8.5*  --   --   --   HCT 26.0* 28.4* 26.5*  --   --   --   PLT 349 413* 417*  --   --   --   LABPROT 15.1  --  14.7  --   --  16.0*  INR 1.18  --  1.14  --   --  1.27  HEPARINUNFRC 0.21* 0.14* 0.27* 0.55 0.44 0.42  CREATININE 5.32*  --  4.13*  --   --   --     Estimated Creatinine Clearance: 17.5 mL/min (by C-G formula based on SCr of 4.13 mg/dL (H)). On HD  Assessment: Patient is on heparin bridging to warfarin for new onset DLE DVT. Heparin level is therapeutic at 0.44 at a rate of 2200 units/hr. INR of 1.27 is subtherpeutic on day 4 of warfarin. H/H low stable. No bleeding per RN.  Goal of Therapy:  INR 2-3  Heparin level 0.3-0.7 Monitor platelets by anticoagulation protocol: Yes   Plan:  Continue heparin gtt 2200 units/hr Warfarin 10 mg x1 Daily heparin level/CBC/INR Monitor for s/sx bleeding  Lianne CureJustin R Mava Suares, PharmD Candidate 03/21/2016,7:37 AM

## 2016-03-21 NOTE — Progress Notes (Signed)
Physical Therapy Treatment Patient Details Name: Valerie Perez MRN: [id number] DOB: November 25, 1960 Today's Date: 03/21/2016    History of Present Illness Pt is a 56 y/o female admitted from home via EMS secondary to weakness. In ED, pt went into V-fib arrest and coded a second time in ED. Pt was intubated from 03/11/16 to 03/13/16. 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    PT Comments    Pt making good progress with mobility.  Follow Up Recommendations  Home health PT;Supervision for mobility/OOB     Equipment Recommendations  None recommended by PT    Recommendations for Other Services       Precautions / Restrictions Precautions Precautions: Fall Restrictions Weight Bearing Restrictions: No    Mobility  Bed Mobility Overal bed mobility: Needs Assistance Bed Mobility: Supine to Sit     Supine to sit: Min assist     General bed mobility comments: Assist to elevate trunk into sitting  Transfers Overall transfer level: Needs assistance Equipment used: Rolling walker (2 wheeled) Transfers: Sit to/from Stand Sit to Stand: Min assist         General transfer comment: Assist to bring hips up and for balance. Verbal cues for hand placement  Ambulation/Gait Ambulation/Gait assistance: Min guard Ambulation Distance (Feet): 275 Feet Assistive device: Rolling walker (2 wheeled) Gait Pattern/deviations: Step-through pattern;Decreased step length - right;Decreased step length - left;Decreased stride length Gait velocity: decreased Gait velocity interpretation: Below normal speed for age/gender General Gait Details: Initial instability but improved with incr distance. Verbal cues initially for use of walker.   Stairs            Wheelchair Mobility    Modified Rankin (Stroke Patients Only)       Balance Overall balance assessment: Needs assistance Sitting-balance support: Feet  supported Sitting balance-Leahy Scale: Good     Standing balance support: During functional activity;Single extremity supported Standing balance-Leahy Scale: Poor Standing balance comment: UE support and min guard for static standing                    Cognition Arousal/Alertness: Awake/alert Behavior During Therapy: WFL for tasks assessed/performed Overall Cognitive Status: Within Functional Limits for tasks assessed                      Exercises      General Comments        Pertinent Vitals/Pain      Home Living                      Prior Function            PT Goals (current goals can now be found in the care plan section) Progress towards PT goals: Progressing toward goals    Frequency    Min 3X/week      PT Plan Current plan remains appropriate    Co-evaluation             End of Session Equipment Utilized During Treatment: Gait belt Activity Tolerance: Patient tolerated treatment well Patient left: with call bell/phone within reach;in chair     Time: 1415-1434 PT Time Calculation (min) (ACUTE ONLY): 19 min  Charges:  $Gait Training: 8-22 mins                    G Codes:      [employer]  Lacretia NicksW [place]ok 03/21/2016, 3:23 PM [employer] CorporationCary Ruhi Kopke PT [phone]

## 2016-03-21 NOTE — Progress Notes (Addendum)
Nutrition Follow-up  DOCUMENTATION CODES:   Morbid obesity  INTERVENTION:   -Continue Nepro Shake po q HS, each supplement provides 425 kcal and 19 grams protein -Educated pt daughter on low K diet; see body of note for further details. Due to pt daughter not being present in room at time of visit and concern for pt's ability to retain information, "Potassium Content of Foods" handout from [facility] Nutrition Care Manual and specifics regarding potassium levels entered into discharge instructions  NUTRITION DIAGNOSIS:   Increased nutrient needs related to wound healing (dialysis) as evidenced by estimated needs.  Ongoing  GOAL:   Patient will meet greater than or equal to 90% of their needs  Progressing  MONITOR:   PO intake, Supplement acceptance, Labs, Weight trends, Skin, I & O's  REASON FOR ASSESSMENT:   Ventilator    ASSESSMENT:   56 year old female with end-stage renal disease on dialysis suffered VF/PEA cardiac arrest for in total just over 45 minutes. Potassium found to be greater than 7.5. Admitted to the pulmonary critical care team with plans for normothermia protocol due to prolonged downtime, fluctuant abnormalities, refractory shock.  Pt preparing to work with physical therapy at time of visit.   Case discussed with RN, who reports that pt daughter and husband requesting diet education prior to discharge. RN reports there is concern for pt's ability to retain information, so daughter is requesting education (RN passed on request for RD to call pt daughter if not present during visit). Pt has a good appetite and eating well. Noted 100% completion of lunch tray.   Per discussion with RN, abdominal wound is healing. Pt is consuming Nepro shake daily per MAR.   Called and spoke with pt daughter Dahlia Client(Hannah) at [phone], per her request. She had multiple questions regarding renal/ carb modified diet, specifically requesting a meal plan with pre-determined potassium levels to  ensure levels are not elevated in the future. She reports that pt typically consumes 2-3 meals per day, but has not been cooking at home as much due to fatigue and weakness. She was unable to provide a diet recall, but expressed frustration of multiple diet restrictions. She asked if there was a certain potassium level that pt should adhere to. Counseled pt daughter on how to decrease potassium diet by limiting high potassium foods and discussed alternatives for lower potassium food alternatives, however, pt daughter was not satisfied with this information ("I can find all of this on the Internet"). Pt daughter reports that she also received education in the past from RD from HD center, but did not find it helpful. Consulted renal RD on staff, who recommended potassium level of 2,100 mg daily. Provided AND's "Potassium Content of Foods" handout from [facility] Nutrition [facility] Manual with specifics regarding recommended K levels in Discharge Instructions in chart.   Labs reviewed: CBGS: 77-115.  Diet Order:  Diet renal/carb modified with fluid restriction Diet-HS Snack? Nothing; Room service appropriate? Yes; Fluid consistency: Thin  Skin:  Wound (see comment) (Abdominal wound)  Last BM:  03/18/16  Height:   Ht Readings from Last 1 Encounters:  03/11/16 5\' 5"  (1.651 m)    Weight:   Wt Readings from Last 1 Encounters:  03/19/16 217 lb 2.5 oz (98.5 kg)    Ideal Body Weight:  56.8 kg  BMI:  Body mass index is 36.14 kg/m.  Estimated Nutritional Needs:   Kcal:  2000-2200  Protein:  125-145 grams  Fluid:  1.2 Ld/ay  EDUCATION NEEDS:   No education  needs identified at this time  Titania Gault A. Mayford Knife, RD, LDN, CDE Pager: [phone] After hours Pager: [phone]

## 2016-03-21 NOTE — Discharge Instructions (Signed)
Information on my medicine - Coumadin®   (Warfarin) ° °This medication education was reviewed with me or my healthcare representative as part of my discharge preparation. ° °Why was Coumadin prescribed for you? °Coumadin was prescribed for you because you have a blood clot or a medical condition that can cause an increased risk of forming blood clots. Blood clots can cause serious health problems by blocking the flow of blood to the heart, lung, or brain. Coumadin can prevent harmful blood clots from forming. °As a reminder your indication for Coumadin is:   Deep Vein Thrombosis Treatment ° °What test will check on my response to Coumadin? °While on Coumadin (warfarin) you will need to have an INR test regularly to ensure that your dose is keeping you in the desired range. The INR (international normalized ratio) number is calculated from the result of the laboratory test called prothrombin time (PT). ° °If an INR APPOINTMENT HAS NOT ALREADY BEEN MADE FOR YOU please schedule an appointment to have this lab work done by your health care provider within 7 days. °Your INR goal is usually a number between:  2 to 3 or your provider may give you a more narrow range like 2-2.5.  Ask your health care provider during an office visit what your goal INR is. ° °What  do you need to  know  About  COUMADIN? °Take Coumadin (warfarin) exactly as prescribed by your healthcare provider about the same time each day.  DO NOT stop taking without talking to the doctor who prescribed the medication.  Stopping without other blood clot prevention medication to take the place of Coumadin may increase your risk of developing a new clot or stroke.  Get refills before you run out. ° °What do you do if you miss a dose? °If you miss a dose, take it as soon as you remember on the same day then continue your regularly scheduled regimen the next day.  Do not take two doses of Coumadin at the same time. ° °Important Safety Information °A possible  side effect of Coumadin (Warfarin) is an increased risk of bleeding. You should call your healthcare provider right away if you experience any of the following: °? Bleeding from an injury or your nose that does not stop. °? Unusual colored urine (red or dark brown) or unusual colored stools (red or black). °? Unusual bruising for unknown reasons. °? A serious fall or if you hit your head (even if there is no bleeding). ° °Some foods or medicines interact with Coumadin® (warfarin) and might alter your response to warfarin. To help avoid this: °? Eat a balanced diet, maintaining a consistent amount of Vitamin K. °? Notify your provider about major diet changes you plan to make. °? Avoid alcohol or limit your intake to 1 drink for women and 2 drinks for men per day. °(1 drink is 5 oz. wine, 12 oz. beer, or 1.5 oz. liquor.) ° °Make sure that ANY health care provider who prescribes medication for you knows that you are taking Coumadin (warfarin).  Also make sure the healthcare provider who is monitoring your Coumadin knows when you have started a new medication including herbals and non-prescription products. ° °Coumadin® (Warfarin)  Major Drug Interactions  °Increased Warfarin Effect Decreased Warfarin Effect  °Alcohol (large quantities) °Antibiotics (esp. Septra/Bactrim, Flagyl, Cipro) °Amiodarone (Cordarone) °Aspirin (ASA) °Cimetidine (Tagamet) °Megestrol (Megace) °NSAIDs (ibuprofen, naproxen, etc.) °Piroxicam (Feldene) °Propafenone (Rythmol SR) °Propranolol (Inderal) °Isoniazid (INH) °Posaconazole (Noxafil) Barbiturates (Phenobarbital) °Carbamazepine (Tegretol) °Chlordiazepoxide (Librium) °  Cholestyramine (Questran) Griseofulvin Oral Contraceptives Rifampin Sucralfate (Carafate) Vitamin K   Coumadin (Warfarin) Major Herbal Interactions  Increased Warfarin Effect Decreased Warfarin Effect  Garlic Ginseng Ginkgo biloba Coenzyme Q10 Green tea [place]. Johns wort    Coumadin (Warfarin) FOOD Interactions  Eat  a consistent number of servings per week of foods HIGH in Vitamin K (1 serving =  cup)  Collards (cooked, or boiled & drained) Kale (cooked, or boiled & drained) Mustard greens (cooked, or boiled & drained) Parsley *serving size only =  cup Spinach (cooked, or boiled & drained) Swiss chard (cooked, or boiled & drained) Turnip greens (cooked, or boiled & drained)  Eat a consistent number of servings per week of foods MEDIUM-HIGH in Vitamin K (1 serving = 1 cup)  Asparagus (cooked, or boiled & drained) Broccoli (cooked, boiled & drained, or raw & chopped) Brussel sprouts (cooked, or boiled & drained) *serving size only =  cup Lettuce, raw (green leaf, endive, romaine) Spinach, raw Turnip greens, raw & chopped   These websites have more information on Coumadin (warfarin):  http://www.king-russell.com/; https://www.hines.net/;  Potassium Content in Foods  Goal for potassium intake in Renal, HD-dependent patients is 2,100 mg daily.   Eating more that the serving size for a moderate or low-potassium food will make it a high-potassium food. Foods made with high-potassium foods will also be high in potassium.  Unless otherwise noted, all foods are cooked: meat is roasted, fish is cooked with dry heat, vegetables are cooked from fresh, and fruit is raw.  This is a guide. Actual values may vary depending on product and/or processing. Foods that are frozen or canned may have higher potassium values. Values are rounded to the closest 5-mg increment and may be averaged with similar foods in group.  High Potassium (more than 200 mg) Food Serving Potassium (mg)  Apricots 2 raw or 5 dry 200  Artichoke 1 medium 345  Avocados, raw  each 245  Banana 1 medium 425  Beans, lima or baked, canned  cup 280  Beans, white, canned  cup 595  Beef roast 3 oz 320  Beef, ground 3 oz 270  Beets, raw or cooked  cup 260  Bran muffin 2 oz 300  Broccoli  cup 230  Brussels sprouts  cup 250  Cantaloupe   cup 215  Cereal, 100% bran  cup 200-400  Cheeseburger, single, fast food 1 each 225-400  Chicken 3 oz 220  Clams, canned 3 oz 535  Crab 3 oz 225  Dates 5 dates 270  Dried beans and peas  cup 300-475  Figs, dried 2 each 260  Fish: halibut, tuna, cod, snapper 3 oz 480  Fish: salmon, haddock, swordfish, perch 3 oz 300  Fish, tuna, canned 3 oz 200  [place] fries, fast food 3 oz/small 470  Granola with fruit, nuts  cup 200  Grapefruit juice  cup 200  Greens, beet  cup 655  Honeydew melon  cup 200  Kale, raw 1 cup 300  Kiwi 1 medium 240  Kohlrabi, rutabaga, parsnips  cup 280  Lentils  cup 365  Mango 1 each 325  Milk, chocolate 1 cup 420  Milk, fat free, low fat, whole, buttermilk 1 cup 350-380  Molasses 1 Tbsp 295  Mushrooms  cup 280  Nectarine 1 each 275  Nuts: almonds, peanuts, hazelnuts, [place], cashew, mixed 1 oz 200  Nuts, pistachios 1 oz 295  Orange 1 each 240  Orange juice  cup 235  Papaya, medium  fruit 390  Peanut butter, chunky 2 Tbsp 240  Peanut butter, smooth 2 Tbsp 210  Pear 1 medium 200  Pomegranate 1 whole 400  Pomegranate juice  cup 215  Pork 3 oz 350  Potato chips, salted 1 oz 465  Potato, baked with skin 1 medium 925  Potatoes, boiled  cup 255  Potatoes, mashed  cup 330  Prune juice  cup 370  Prunes 5 each 305  Pudding, chocolate  cup 230  Pumpkin, canned  cup 250  Raisins, seedless  cup 270  Seeds, sunflower or pumpkin 1 oz 240  Soy milk 1 cup 300  Spinach  cup 420  Spinach, canned  cup 370  Sweet potato, baked with skin 1 medium 450  Swiss chard  cup 480  Tomato or vegetable juice  cup 275  Tomato sauce or puree  cup 400-550  Tomato, raw 1 medium 290  Tomatoes, canned  cup 200-300  [place]ur[place] 3 oz 250  Wheat germ 1 oz 250  Winter squash  cup 250  Yogurt, plain or fruited 6 oz 260-435  Zucchini  cup 220  Moderate Potassium (50-200 mg) Food Serving Potassium (mg)  Apple 1 each 150  Apple juice  cup 150  Applesauce   cup 90  Apricot nectar  cup 140  Asparagus, small spears  cup or 6 spears 155  Bagel, cinnamon raisin 1 each 130  Bagel, 4?: egg or plain 1 each 70  Beans, green  cup 90  Beans, yellow  cup 190  Beer, regular 12 oz 100  Beets, canned  cup 125  Blackberries  cup 115  Blueberries  cup 60  Bread, whole wheat 1 slice 70  Broccoli, raw  cup 145  Cabbage  cup 150  Carrots, cooked or raw  cup 180  Cauliflower, raw  cup 150  Celery, raw  cup 155  Cereal, bran flakes  cup 120-150  Cheese, cottage  cup 110  Cherries 10 each 150  Chocolate 1-oz bar 165  Coffee, brewed 6 oz 90  Corn  cup or 1 ear 195  Cucumbers  cup 80  Egg, large 1 each 60  Eggplant  cup 60  Endive, raw  cup 80  English muffin 1 each 65  Fish, orange roughy 3 oz 150  Frankfurter, beef/pork 1 each 75  Fruit cocktail  cup 115  Grape juice  cup 170  Grapefruit  fruit 175  Grapes  cup 155  Greens: kale, turnip, collard  cup 110-150  Ice cream or frozen yogurt, chocolate  cup 175  Ice cream or frozen yogurt, vanilla  cup 120-150  Lemons, limes 1 each 80  Lettuce, all types 1 cup 100  Mixed vegetables  cup 150  Mushrooms, raw  cup 110  Nuts: walnuts, pecans, macadamia 1 oz 125  Oatmeal  cup 80  Okra  cup 110  Onions, raw  cup 120  Peach 1 each 185  Peaches, canned  cup 120  Pears, canned  cup 120  Peas, green, frozen  cup 90  Peppers, green  cup 130  Peppers, red  cup 160  Pineapple juice  cup 165  Pineapple, fresh or canned  cup 100  Plums 1 each 105  Pudding, vanilla  cup 150  Raspberries  cup 90  Rhubarb  cup 115  Rice, wild  cup 80  Shrimp 3 oz 155  Spinach, raw 1 cup 170  Strawberries  cup 125  Summer squash  cup 175-200  Swiss chard, raw 1 cup  135  Tangerines 1 each 140  Tea, brewed 6 oz 65  Turnips  cup 140  Watermelon  cup 85  Wine, red, table 5 oz 180  Wine, white, table 5 oz 100  Lower Potassium (less than 50 mg) Food Serving Potassium (mg)    Bread, white 1 slice 30  Carbonated beverages 12 oz < 5  Cheese 1 oz 20-30  Cranberries  cup 45  Cranberry juice cocktail  cup 20  Fats and oils 1 Tbsp < 5  Hummus 1 Tbsp 32  Nectar: papaya, mango, or pear  cup 35  Rice, white or brown  cup 50  Spaghetti/macaroni, cooked  cup 30  Tortilla, flour or corn 1 each 50  Waffle, 4? 1 each 50  Water chestnuts  cup 40  Information obtained from Academy of [employer] and Dietetics Nutrition Care Manual Other helpful websites for renal diet recipes and nutrition education include: www.kidney.org and www.davita.com

## 2016-03-21 NOTE — Progress Notes (Signed)
Pt c/o of rt shoulder pain and difficulty moving arm. States that earlier on day shift she was transferring to chair and felt a "pop". On call text paged and ordered xray of rt shoulder. PRN tylenol given for pain

## 2016-03-22 LAB — CBC
HEMATOCRIT: 24.1 % — AB (ref 36.0–46.0)
HEMOGLOBIN: 7.7 g/dL — AB (ref 12.0–15.0)
MCH: 27.7 pg (ref 26.0–34.0)
MCHC: 32 g/dL (ref 30.0–36.0)
MCV: 86.7 fL (ref 78.0–100.0)
Platelets: 513 10*3/uL — ABNORMAL HIGH (ref 150–400)
RBC: 2.78 MIL/uL — ABNORMAL LOW (ref 3.87–5.11)
RDW: 17.4 % — AB (ref 11.5–15.5)
WBC: 9.2 10*3/uL (ref 4.0–10.5)

## 2016-03-22 LAB — BASIC METABOLIC PANEL
ANION GAP: 18 — AB (ref 5–15)
BUN: 103 mg/dL — ABNORMAL HIGH (ref 6–20)
CALCIUM: 9.7 mg/dL (ref 8.9–10.3)
CHLORIDE: 92 mmol/L — AB (ref 101–111)
CO2: 22 mmol/L (ref 22–32)
CREATININE: 8.38 mg/dL — AB (ref 0.44–1.00)
GFR calc non Af Amer: 5 mL/min — ABNORMAL LOW (ref >60)
GFR, EST AFRICAN AMERICAN: 5 mL/min — AB (ref >60)
Glucose, Bld: 97 mg/dL (ref 65–99)
Potassium: 5.2 mmol/L — ABNORMAL HIGH (ref 3.5–5.1)
SODIUM: 132 mmol/L — AB (ref 135–145)

## 2016-03-22 LAB — HEPARIN LEVEL (UNFRACTIONATED): HEPARIN UNFRACTIONATED: 0.45 [IU]/mL (ref 0.30–0.70)

## 2016-03-22 LAB — PROTIME-INR
INR: 1.59
Prothrombin Time: 19.1 seconds — ABNORMAL HIGH (ref 11.4–15.2)

## 2016-03-22 MED ORDER — ACETAMINOPHEN 325 MG PO TABS
ORAL_TABLET | ORAL | Status: AC
  Filled 2016-03-22: qty 2

## 2016-03-22 MED ORDER — OXYCODONE HCL 5 MG PO TABS
ORAL_TABLET | ORAL | Status: AC
  Administered 2016-03-22: 5 mg via ORAL
  Filled 2016-03-22: qty 1

## 2016-03-22 MED ORDER — LIDOCAINE-PRILOCAINE 2.5-2.5 % EX CREA: 1.0000 "application " | TOPICAL_CREAM | CUTANEOUS | Status: DC | PRN

## 2016-03-22 MED ORDER — SODIUM CHLORIDE 0.9 % IV SOLN: 100.0000 mL | INTRAVENOUS | Status: DC | PRN

## 2016-03-22 MED ORDER — PENTAFLUOROPROP-TETRAFLUOROETH EX AERO: 1.0000 "application " | INHALATION_SPRAY | CUTANEOUS | Status: DC | PRN

## 2016-03-22 MED ORDER — ALTEPLASE 2 MG IJ SOLR: 2.0000 mg | Freq: Once | INTRAMUSCULAR | Status: DC | PRN

## 2016-03-22 MED ORDER — LIDOCAINE HCL (PF) 1 % IJ SOLN: 5.0000 mL | INTRAMUSCULAR | Status: DC | PRN

## 2016-03-22 MED ORDER — WARFARIN SODIUM 10 MG PO TABS
10.0000 mg | ORAL_TABLET | Freq: Once | ORAL | Status: AC
  Administered 2016-03-22: 10 mg via ORAL
  Filled 2016-03-22: qty 1

## 2016-03-22 MED ORDER — HEPARIN SODIUM (PORCINE) 1000 UNIT/ML DIALYSIS: 1000.0000 [IU] | INTRAMUSCULAR | Status: DC | PRN

## 2016-03-22 NOTE — Progress Notes (Signed)
Dialysis treatment completed.  4500 mL ultrafiltrated and net fluid removal 4000 mL.    Patient status unchanged. Lung sounds clear to ausculation in all fields. Generalized edema. Cardiac: NSR.  Disconnected lines and removed needles.  Pressure held for 5 minutes and band aid/gauze dressing applied.  Report given to bedside RN, Vernona RiegerLaura.

## 2016-03-22 NOTE — Procedures (Signed)
INR up to 1.59. NO new issues, attempting lowering of EDW further today.     I was present at this dialysis session, have reviewed the session itself and made  appropriate changes Valerie Perez Valerie Duddy MD [facility] pager [phone]   03/22/2016, 10:54 AM

## 2016-03-22 NOTE — Progress Notes (Signed)
Patient arrived to unit per bed.  Reviewed treatment plan and this RN agrees.  Report received from bedside RN, Traci.  Consent verified.  Patient A & O X 4. Lung sounds diminished to ausculation in all fields. RLE 1+ edema. Cardiac: sb.  Prepped RUAVF with alcohol and cannulated one 17 gauge needle for arterial access, RIJ cleansed per policy and accessed for venous access.  Pulsation of blood noted.  Flushed access well with saline per protocol.  Connected and secured lines and initiated tx at 0920.  UF goal of 4500 mL and net fluid removal of 4000 mL.  Will continue to monitor.

## 2016-03-22 NOTE — Progress Notes (Signed)
PROGRESS NOTE    Valerie Perez  ZOX:[id number] DOB: 03-29-1960 DOA: 03/11/2016 PCP: Pcp Not In System   Chief Complaint  Patient presents with  . Cardiac Arrest     Brief Narrative:  56 year old female with end-stage renal disease on Hemodialysis, suffered VF/PEA cardiac arrest for in total just over 45 minutes. Potassium found to be greater than 7.5. Pt was admitted to the pulmonary critical care team with plans for normothermia protocol due to prolonged downtime, fluctuant abnormalities, refractory shock.Now with RLE DVT, on coumadin, bridging with IV heparin.  Assessment & Plan   Acute hypoxemic hypercarbic respiratory failure  -secondary to aspiration pneumonia and cardiac arrest  -Stable respiratory status -Extubated 1/18  -currently on room air, stable  Cardiogenic shock secondary to cardiac arrest / Elevated troponin  -In the setting of hyperkalemia in pt with ESRD. 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 -Nephrology consulted and appreciated -potassium 5.2 (patient due to HD today)  Chronic Abdominal Wound -Seen by wound care- dressing in place, shallow and healing, well not infected  R Popliteal DVT -On heparin drip,started Coumadin 03/18/2016 -INR subtherapeutic 1.59  Possible Aspiration PNA -Completed 7 days on Augmentin 03/17/16   Acute encephalopathy -Had confusion on 1/19 -Appears to be at baseline today  Hypertensive urgency -Continueclonidine, hydralazine, Imdur, labetalol and Norvasc  -mostly volume mediated in the patient with ESRD  Dyslipidemia -Continue Lipitor   Hypothyroidism -Continue synthroid   OSA on CPAP -Stable   Anemia of chronic disease -Hemoglobin currently 7.7, continue to monitor  CBC  Deconditioning -PT consulted recommended Home health  Right arm pain -Xray showed no fracture or dislocation -Continue PT  DVT Prophylaxis  Heparin/coumadin  Code Status: Full  Family Communication: None at bedside.  Disposition Plan: Admitted. Pending therapeutic INR  Consultants Cardiology PCCM Nephrology   Procedures / Significant Events 1/16 Cardiac arrest 1/16 Echocardiogram EF60-65% 1/18 LE doppler 1/18 Extubated 1/19 LHC  Antibiotics   Anti-infectives    Start     Dose/Rate Route Frequency Ordered Stop   03/14/16 2200  amoxicillin-clavulanate (AUGMENTIN) 500-125 MG per tablet 500 mg     1 tablet Oral Daily 03/14/16 0930 03/17/16 2251   03/11/16 1800  piperacillin-tazobactam (ZOSYN) IVPB 3.375 g  Status:  Discontinued    Comments:  CRRT dosing   3.375 g 100 mL/hr over 30 Minutes Intravenous Every 6 hours 03/11/16 1615 03/14/16 0928   03/11/16 1700  vancomycin (VANCOCIN) IVPB 1000 mg/200 mL premix  Status:  Discontinued     1,000 mg 200 mL/hr over 60 Minutes Intravenous Every 24 hours 03/11/16 1615 03/14/16 0928   03/11/16 0915  vancomycin (VANCOCIN) IVPB 1000 mg/200 mL premix     1,000 mg 200 mL/hr over 60 Minutes Intravenous  Once 03/11/16 0904 03/11/16 1211   03/11/16 0915  piperacillin-tazobactam (ZOSYN) IVPB 4.5 g     4.5 g 200 mL/hr over 30 Minutes Intravenous  Once 03/11/16 0904 03/11/16 1135      Subjective:   Valerie Perez seen and examined today.  Patient states she feels better today. Complains of right shoulder pain.  Denies shortness of breath, abdominal pain, nausea or vomiting, headache, dizziness.    Objective:   Vitals:   03/22/16 0930 03/22/16 1000 03/22/16 1030 03/22/16 1100  BP: (!) 155/54 (!) 154/46 (!) 165/54 (!) 158/53  Pulse: (!) 57  66 62 61  Resp:      Temp:      TempSrc:      SpO2:      Weight:      Height:        Intake/Output Summary (Last 24 hours) at 03/22/16 1128 Last data filed at 03/22/16 0400  Gross  per 24 hour  Intake          1323.67 ml  Output                0 ml  Net          1323.67 ml   Filed Weights   03/19/16 0715 03/19/16 1110 03/22/16 0905  Weight: 102.5 kg (225 lb 15.5 oz) 98.5 kg (217 lb 2.5 oz) 103 kg (227 lb 1.2 oz)    Exam  General: Well developed, well nourished, NAD, appears stated age  HEENT: NCAT, mucous membranes moist.   Cardiovascular: S1 S2 auscultated, no murmurs, RRR  Respiratory: Clear to auscultation bilaterally   Abdomen: Soft, obese, nontender, nondistended, + bowel sounds  Extremities: warm dry without cyanosis clubbing.   Neuro: AAOx3, nonfocal  Psych: Normal affect and demeanor, pleasant   Data Reviewed: I have personally reviewed following labs and imaging studies  CBC:  Recent Labs Lab 03/18/16 0500 03/19/16 0255 03/19/16 1613 03/20/16 0014 03/22/16 0400  WBC 9.2 8.6 10.2 8.9 9.2  HGB 7.5* 8.3* 9.0* 8.5* 7.7*  HCT 23.5* 26.0* 28.4* 26.5* 24.1*  MCV 88.3 88.1 88.5 88.3 86.7  PLT 265 349 413* 417* 513*   Basic Metabolic Panel:  Recent Labs Lab 03/17/16 0824 03/18/16 0500 03/19/16 0255 03/20/16 0014 03/22/16 0400  NA 137 136 134* 133* 132*  K 4.1 4.2 4.1 4.3 5.2*  CL 102 102 96* 95* 92*  CO2 21* 21* 26 26 22   GLUCOSE 81 85 117* 144* 97  BUN 49* 71* 42* 35* 103*  CREATININE 6.47* 8.27* 5.32* 4.13* 8.38*  CALCIUM 9.0 8.8* 9.0 9.2 9.7   GFR: Estimated Creatinine Clearance: 8.8 mL/min (by C-G formula based on SCr of 8.38 mg/dL (H)). Liver Function Tests: No results for input(s): AST, ALT, ALKPHOS, BILITOT, PROT, ALBUMIN in the last 168 hours. No results for input(s): LIPASE, AMYLASE in the last 168 hours. No results for input(s): AMMONIA in the last 168 hours. Coagulation Profile:  Recent Labs Lab 03/19/16 0255 03/20/16 0014 03/21/16 0313 03/22/16 0400  INR 1.18 1.14 1.27 1.59   Cardiac Enzymes: No results for input(s): CKTOTAL, CKMB, CKMBINDEX, TROPONINI in the last 168 hours. BNP (last 3 results) No  results for input(s): PROBNP in the last 8760 hours. HbA1C: No results for input(s): HGBA1C in the last 72 hours. CBG:  Recent Labs Lab 03/19/16 1958 03/19/16 2353 03/20/16 0348 03/20/16 0743 03/20/16 1112  GLUCAP 168* 138* 101* 77 115*   Lipid Profile: No results for input(s): CHOL, HDL, LDLCALC, TRIG, CHOLHDL, LDLDIRECT in the last 72 hours. Thyroid Function Tests: No results for input(s): TSH, T4TOTAL, FREET4, T3FREE, THYROIDAB in the last 72 hours. Anemia Panel: No results for input(s): VITAMINB12, FOLATE, FERRITIN, TIBC, IRON, RETICCTPCT in the last 72 hours. Urine analysis:    Component Value Date/Time   COLORURINE YELLOW 04/28/2015 2025   APPEARANCEUR CLEAR 04/28/2015 2025   LABSPEC 1.010 04/28/2015 2025   PHURINE 8.5 (H) 04/28/2015 2025   GLUCOSEU 250 (A) 04/28/2015 2025   HGBUR SMALL (A) 04/28/2015 2025   BILIRUBINUR NEGATIVE 04/28/2015 2025   KETONESUR NEGATIVE 04/28/2015 2025   PROTEINUR >300 (  A) 04/28/2015 2025   NITRITE NEGATIVE 04/28/2015 2025   LEUKOCYTESUR NEGATIVE 04/28/2015 2025   Sepsis Labs: @LABRCNTIP (procalcitonin:4,lacticidven:4)  ) No results found for this or any previous visit (from the past 240 hour(s)).    Radiology Studies: Dg Shoulder Right  Result Date: 03/21/2016 CLINICAL DATA:  Acute onset of right shoulder pain. Initial encounter. EXAM: RIGHT SHOULDER - 2+ VIEW COMPARISON:  None. FINDINGS: There is no evidence of fracture or dislocation. The right humeral head is seated within the glenoid fossa. Minimal degenerative change is noted at the right acromioclavicular joint. No significant soft tissue abnormalities are seen. The visualized portions of the right lung are clear. A right-sided dual-lumen catheter is partially imaged. IMPRESSION: No evidence of fracture or dislocation. Electronically Signed   By: Roanna Raider M.D.   On: 03/21/2016 23:08     Scheduled Meds: . amLODipine  10 mg Oral Daily  . atorvastatin  80 mg Oral q1800  .  cloNIDine  0.1 mg Oral TID  . darbepoetin (ARANESP) injection - DIALYSIS  150 mcg Intravenous Q Tue-HD  . feeding supplement (NEPRO CARB STEADY)  237 mL Oral QHS  . ferric gluconate (FERRLECIT/NULECIT) IV  125 mg Intravenous Q T,Th,Sa-HD  . fluticasone  1 spray Each Nare Daily  . gabapentin  300 mg Oral QHS  . hydrALAZINE  100 mg Oral Q8H  . isosorbide mononitrate  30 mg Oral Daily  . labetalol  200 mg Oral BID  . levothyroxine  75 mcg Oral QAC breakfast  . loratadine  10 mg Oral Q48H  . montelukast  10 mg Oral QHS  . polyethylene glycol  17 g Oral Daily  . predniSONE  5 mg Oral Q breakfast  . warfarin  10 mg Oral ONCE-1800  . Warfarin - Pharmacist Dosing Inpatient   Does not apply q1800   Continuous Infusions: . heparin 2,200 Units/hr (03/22/16 0603)     LOS: 11 days   Time Spent in minutes   30 minutes  Lianny Molter D.O. on 03/22/2016 at 11:28 AM  Between 7am to 7pm - Pager - [phone]  After 7pm go to [email or web address] - password [email or web address]  And look for the night coverage person covering for me after hours  [facility] Office  [phone]

## 2016-03-22 NOTE — Progress Notes (Signed)
ANTICOAGULATION CONSULT NOTE - Follow Up Consult  Pharmacy Consult for heparin bridge to warfarin Indication: RLE DVT  Allergies  Allergen Reactions  . Tape Other (See Comments)    Tears skin  . 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    Patient Measurements: Height: 5\' 5"  (165.1 cm) Weight: 217 lb 2.5 oz (98.5 kg) IBW/kg (Calculated) : 57 Heparin Dosing Weight: 82 kg  Vital Signs: Temp: 98.3 F (36.8 C) (01/27 0323) Temp Source: Axillary (01/27 0323) BP: 158/53 (01/27 0323) Pulse Rate: 56 (01/27 0323)  Labs:  Recent Labs  03/19/16 1613 03/20/16 0014  03/20/16 2047 03/21/16 0313 03/22/16 0400 03/22/16 0420  HGB 9.0* 8.5*  --   --   --  7.7*  --   HCT 28.4* 26.5*  --   --   --  24.1*  --   PLT 413* 417*  --   --   --  513*  --   LABPROT  --  14.7  --   --  16.0* 19.1*  --   INR  --  1.14  --   --  1.27 1.59  --   HEPARINUNFRC 0.14* 0.27*  < > 0.44 0.42  --  0.45  CREATININE  --  4.13*  --   --   --  8.38*  --   < > = values in this interval not displayed.  Estimated Creatinine Clearance: 8.6 mL/min (by C-G formula based on SCr of 8.38 mg/dL (H)). On HD  Assessment: Patient is on heparin bridge to warfarin for new onset DLE DVT. Heparin level is therapeutic at 0.45 at a rate of 2200 units/hr. INR 1.59 on day 5 of warfarin with a nice trend up now. hgb down. No issues or bleeding reported.  Goal of Therapy:  INR 2-3  Heparin level 0.3-0.7 Monitor platelets by anticoagulation protocol: Yes   Plan:  Continue heparin gtt 2200 units/hr Warfarin 10 mg x1 Daily heparin level/CBC/INR Monitor for s/sx bleeding  Sherron MondayAubrey N. Errick Salts, PharmD Clinical Pharmacy Resident Pager: [phone] 03/22/16 7:45 AM

## 2016-03-22 NOTE — Progress Notes (Signed)
Pt is on NIV at this time tolerating it well.  

## 2016-03-23 DIAGNOSIS — D899 Disorder involving the immune mechanism, unspecified: Secondary | ICD-10-CM

## 2016-03-23 LAB — HEPARIN LEVEL (UNFRACTIONATED)
HEPARIN UNFRACTIONATED: 0.5 [IU]/mL (ref 0.30–0.70)
Heparin Unfractionated: 0.25 IU/mL — ABNORMAL LOW (ref 0.30–0.70)

## 2016-03-23 LAB — CBC
HCT: 26.2 % — ABNORMAL LOW (ref 36.0–46.0)
HEMOGLOBIN: 8.3 g/dL — AB (ref 12.0–15.0)
MCH: 27.9 pg (ref 26.0–34.0)
MCHC: 31.7 g/dL (ref 30.0–36.0)
MCV: 87.9 fL (ref 78.0–100.0)
PLATELETS: 637 10*3/uL — AB (ref 150–400)
RBC: 2.98 MIL/uL — AB (ref 3.87–5.11)
RDW: 17.9 % — ABNORMAL HIGH (ref 11.5–15.5)
WBC: 10.8 10*3/uL — AB (ref 4.0–10.5)

## 2016-03-23 LAB — PROTIME-INR
INR: 1.82
PROTHROMBIN TIME: 21.3 s — AB (ref 11.4–15.2)

## 2016-03-23 LAB — BASIC METABOLIC PANEL
Anion gap: 14 (ref 5–15)
BUN: 61 mg/dL — ABNORMAL HIGH (ref 6–20)
CHLORIDE: 94 mmol/L — AB (ref 101–111)
CO2: 24 mmol/L (ref 22–32)
Calcium: 9.7 mg/dL (ref 8.9–10.3)
Creatinine, Ser: 5.96 mg/dL — ABNORMAL HIGH (ref 0.44–1.00)
GFR calc Af Amer: 8 mL/min — ABNORMAL LOW (ref >60)
GFR, EST NON AFRICAN AMERICAN: 7 mL/min — AB (ref >60)
Glucose, Bld: 94 mg/dL (ref 65–99)
POTASSIUM: 5.3 mmol/L — AB (ref 3.5–5.1)
SODIUM: 132 mmol/L — AB (ref 135–145)

## 2016-03-23 LAB — GLUCOSE, CAPILLARY
GLUCOSE-CAPILLARY: 113 mg/dL — AB (ref 65–99)
Glucose-Capillary: 153 mg/dL — ABNORMAL HIGH (ref 65–99)

## 2016-03-23 MED ORDER — DOXYCYCLINE HYCLATE 100 MG PO TABS
100.0000 mg | ORAL_TABLET | Freq: Two times a day (BID) | ORAL | Status: DC
  Administered 2016-03-23 – 2016-03-24 (×3): 100 mg via ORAL
  Filled 2016-03-23 (×3): qty 1

## 2016-03-23 MED ORDER — WARFARIN SODIUM 10 MG PO TABS
10.0000 mg | ORAL_TABLET | Freq: Once | ORAL | Status: AC
  Administered 2016-03-23: 10 mg via ORAL
  Filled 2016-03-23: qty 1

## 2016-03-23 MED ORDER — SODIUM POLYSTYRENE SULFONATE 15 GM/60ML PO SUSP
15.0000 g | Freq: Once | ORAL | Status: AC
  Administered 2016-03-23: 15 g via ORAL
  Filled 2016-03-23: qty 60

## 2016-03-23 NOTE — Progress Notes (Signed)
[facility] Progress Note   Subjective: no c/o, INR up to 1.8 today   Vitals:   03/23/16 0755 03/23/16 1233 03/23/16 1423 03/23/16 1639  BP: (!) 166/63 140/61 (!) 163/85 (!) 141/52  Pulse: 61 (!) 49 (!) 54   Resp: (!) 23 (!) 21 (!) 24   Temp: 98.1 F (36.7 C) 97.8 F (36.6 C) 97.9 F (36.6 C)   TempSrc: Oral Oral Oral   SpO2: 97% 93% 92%   Weight:      Height:        Inpatient medications: . amLODipine  10 mg Oral Daily  . atorvastatin  80 mg Oral q1800  . cloNIDine  0.1 mg Oral TID  . darbepoetin (ARANESP) injection - DIALYSIS  150 mcg Intravenous Q Tue-HD  . doxycycline  100 mg Oral Q12H  . feeding supplement (NEPRO CARB STEADY)  237 mL Oral QHS  . ferric gluconate (FERRLECIT/NULECIT) IV  125 mg Intravenous Q T,Th,Sa-HD  . fluticasone  1 spray Each Nare Daily  . gabapentin  300 mg Oral QHS  . hydrALAZINE  100 mg Oral Q8H  . isosorbide mononitrate  30 mg Oral Daily  . labetalol  200 mg Oral BID  . levothyroxine  75 mcg Oral QAC breakfast  . loratadine  10 mg Oral Q48H  . montelukast  10 mg Oral QHS  . polyethylene glycol  17 g Oral Daily  . predniSONE  5 mg Oral Q breakfast  . Warfarin - Pharmacist Dosing Inpatient   Does not apply q1800   . heparin 2,200 Units/hr (03/23/16 0815)   acetaminophen **OR** acetaminophen, white petrolatum  Exam: Alert, obese Valerie Perez in no distress No jvd Chest clear bilat RRR no mrg Abd soft ntnd obese, healing open wound SP area, no drainage Ext +edema LE's Neuro A & Ox 3  Dialysis: TTS [facility] 3.5h   103kg  2/2 bath  AVF / TDC  Hep 3000  - M200 last 1/9  - Calc 1 ug tiw      Assessment: 1. Cardiac arrest (Vfib/ PEA/ hyperkalemia severe) - sp cooling protocol. Recovered well. Heart cath no sig CAD.  2. DVT leg - on IV hep, coumadin, awaiting rise in INR  3. Hyperkalemia - high K on admit. DC"d lisinopril with hx high K+. Have educated patient  4. ESRD HD TTS, sp CRRT 5. Volume - is 3 kg under dry wt 6. HTN -  bp's good on norvasc/ clon/ labet/ hydral 7. 2HPTH - stable 8. DM 9. Hx failed renal tx 10. Debility - PT working w pt  Plan - home when INR in range   Vinson Moselleob Jesusa Stenerson MD [facility] pager [phone]   03/23/2016, 6:46 PM    Recent Labs Lab 03/20/16 0014 03/22/16 0400 03/23/16 0152  NA 133* 132* 132*  K 4.3 5.2* 5.3*  CL 95* 92* 94*  CO2 26 22 24   GLUCOSE 144* 97 94  BUN 35* 103* 61*  CREATININE 4.13* 8.38* 5.96*  CALCIUM 9.2 9.7 9.7   No results for input(s): AST, ALT, ALKPHOS, BILITOT, PROT, ALBUMIN in the last 168 hours.  Recent Labs Lab 03/20/16 0014 03/22/16 0400 03/23/16 0152  WBC 8.9 9.2 10.8*  HGB 8.5* 7.7* 8.3*  HCT 26.5* 24.1* 26.2*  MCV 88.3 86.7 87.9  PLT 417* 513* 637*   Iron/TIBC/Ferritin/ %Sat    Component Value Date/Time   IRON 15 (L) 03/18/2016 0930   TIBC 270 03/18/2016 0930   IRONPCTSAT 6 (L) 03/18/2016 0930

## 2016-03-23 NOTE — Progress Notes (Signed)
ANTICOAGULATION CONSULT NOTE - Follow Up Consult  Pharmacy Consult for heparin bridge to warfarin Indication: RLE DVT  Allergies  Allergen Reactions  . Tape Other (See Comments)    Tears skin  . 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    Patient Measurements: Height: 5\' 5"  (165.1 cm) Weight: 220 lb 7.4 oz (100 kg) IBW/kg (Calculated) : 57 Heparin Dosing Weight: 82 kg  Vital Signs: Temp: 97.8 F (36.6 C) (01/28 1233) Temp Source: Oral (01/28 1233) BP: 140/61 (01/28 1233) Pulse Rate: 49 (01/28 1233)  Labs:  Recent Labs  03/21/16 0313 03/22/16 0400 03/22/16 0420 03/23/16 0152 03/23/16 1027  HGB  --  7.7*  --  8.3*  --   HCT  --  24.1*  --  26.2*  --   PLT  --  513*  --  637*  --   LABPROT 16.0* 19.1*  --  21.3*  --   INR 1.27 1.59  --  1.82  --   HEPARINUNFRC 0.42  --  0.45 0.25* 0.50  CREATININE  --  8.38*  --  5.96*  --     Estimated Creatinine Clearance: 12.2 mL/min (by C-G formula based on SCr of 5.96 mg/dL (H)). On HD  Assessment: Patient is on heparin bridge to warfarin for new onset DLE DVT. Heparin level initially subtherapeutic this AM but nurse reported that the IV had been beeping and that the patient's arm was bent a couple of times. Patient has been therapeutic previously on the rate, so a repeat level was checked. Level came back therapeutic. INR 1.82 on day 6 of warfarin with a nice trend up now. hgb slightly up. No issues or bleeding reported.  Goal of Therapy:  INR 2-3  Heparin level 0.3-0.7 Monitor platelets by anticoagulation protocol: Yes   Plan:  Continue heparin gtt 2200 units/hr Warfarin 10 mg x1 Daily heparin level/CBC/INR Monitor for s/sx bleeding  Sherron MondayAubrey N. Rage Beever, PharmD Clinical Pharmacy Resident Pager: [phone] 03/23/16 12:56 PM

## 2016-03-23 NOTE — Progress Notes (Signed)
ANTICOAGULATION CONSULT NOTE - Follow Up Consult  Pharmacy Consult for heparin Indication: RLE DVT  Allergies  Allergen Reactions  . Tape Other (See Comments)    Tears skin  . 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    Patient Measurements: Height: 5\' 5"  (165.1 cm) Weight: 220 lb 7.4 oz (100 kg) IBW/kg (Calculated) : 57 Heparin Dosing Weight: 82 kg  Vital Signs: Temp: 98.7 F (37.1 C) (01/28 0336) Temp Source: Axillary (01/28 0336) BP: 140/53 (01/28 0336) Pulse Rate: 57 (01/28 0336)  Labs:  Recent Labs  03/21/16 0313 03/22/16 0400 03/22/16 0420 03/23/16 0152  HGB  --  7.7*  --  8.3*  HCT  --  24.1*  --  26.2*  PLT  --  513*  --  637*  LABPROT 16.0* 19.1*  --   --   INR 1.27 1.59  --   --   HEPARINUNFRC 0.42  --  0.45 0.25*  CREATININE  --  8.38*  --  5.96*    Estimated Creatinine Clearance: 12.2 mL/min (by C-G formula based on SCr of 5.96 mg/dL (H)). On HD  Assessment: Patient is on heparin bridge to warfarin for new onset DLE DVT. Heparin level dropped to 0.25 at a rate of 2200 units/hr- per RN, a few times she went in to the room, the IV was beeping and patient's arm was bent. No bleeding noted. CBC is stable.  Goal of Therapy:  Heparin level 0.3-0.7 Monitor platelets by anticoagulation protocol: Yes   Plan:  -Continue heparin gtt 2200 units/hr as likely level was drawn after IV had some issues and previous levels in range -Recheck heparin level at 1100 -Daily heparin level/CBC/INR -Monitor for s/sx bleeding  Maddoxx Burkitt D. Jeanelle Dake, PharmD, BCPS Clinical Pharmacist Pager: [phone] 03/23/2016 4:41 AM

## 2016-03-23 NOTE — Progress Notes (Signed)
Pt placed on CPAP per MD order. Pt wears CPAP at home but does not know her setting. Pt set up with 10cm H2O pressure. Pt says it feels comfortable. Will cont to monitor

## 2016-03-23 NOTE — Progress Notes (Signed)
PROGRESS NOTE    Valerie Perez  ZOX:[id number] DOB: 1960-05-14 DOA: 03/11/2016 PCP: Pcp Not In System   Chief Complaint  Patient presents with  . Cardiac Arrest     Brief Narrative:  56 year old female with end-stage renal disease on Hemodialysis, suffered VF/PEA cardiac arrest for in total just over 45 minutes. Potassium found to be greater than 7.5. Pt was admitted to the pulmonary critical care team with plans for normothermia protocol due to prolonged downtime, fluctuant abnormalities, refractory shock.Now with RLE DVT, on coumadin, bridging with IV heparin.  Assessment & Plan   Acute hypoxemic hypercarbic respiratory failure  -secondary to aspiration pneumonia and cardiac arrest  -Stable respiratory status -Extubated 1/18  -currently on room air, stable  Cardiogenic shock secondary to cardiac arrest / Elevated troponin  -In the setting of hyperkalemia in pt with ESRD. 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 -Nephrology consulted and appreciated -potassium 5.3 (will give kayexalate)  Chronic Abdominal Wound -Seen by wound care- dressing in place, shallow and healing, well not infected  R Popliteal DVT -On heparin drip,started Coumadin 03/18/2016 -INR subtherapeutic 1.82  Possible Aspiration PNA -Completed 7 days on Augmentin 03/17/16   Acute encephalopathy -Had confusion on 1/19 -Appears to be at baseline today  Hypertensive urgency -Continueclonidine, hydralazine, Imdur, labetalol and Norvasc  -mostly volume mediated in the patient with ESRD  Dyslipidemia -Continue Lipitor   Hypothyroidism -Continue synthroid   OSA on CPAP -Stable   Anemia of chronic disease -Hemoglobin currently 8.3, continue to monitor  CBC  Deconditioning -PT consulted recommended Home health  Right arm pain -Xray showed no fracture or dislocation -Continue PT  DVT Prophylaxis  Heparin/coumadin  Code Status: Full  Family Communication: None at bedside.  Disposition Plan: Admitted. Will transfer to tele. Pending therapeutic INR  Consultants Cardiology PCCM Nephrology   Procedures / Significant Events 1/16 Cardiac arrest 1/16 Echocardiogram EF60-65% 1/18 LE doppler 1/18 Extubated 1/19 LHC  Antibiotics   Anti-infectives    Start     Dose/Rate Route Frequency Ordered Stop   03/23/16 1000  doxycycline (VIBRA-TABS) tablet 100 mg     100 mg Oral Every 12 hours 03/23/16 0752     03/14/16 2200  amoxicillin-clavulanate (AUGMENTIN) 500-125 MG per tablet 500 mg     1 tablet Oral Daily 03/14/16 0930 03/17/16 2251   03/11/16 1800  piperacillin-tazobactam (ZOSYN) IVPB 3.375 g  Status:  Discontinued    Comments:  CRRT dosing   3.375 g 100 mL/hr over 30 Minutes Intravenous Every 6 hours 03/11/16 1615 03/14/16 0928   03/11/16 1700  vancomycin (VANCOCIN) IVPB 1000 mg/200 mL premix  Status:  Discontinued     1,000 mg 200 mL/hr over 60 Minutes Intravenous Every 24 hours 03/11/16 1615 03/14/16 0928   03/11/16 0915  vancomycin (VANCOCIN) IVPB 1000 mg/200 mL premix     1,000 mg 200 mL/hr over 60 Minutes Intravenous  Once 03/11/16 0904 03/11/16 1211   03/11/16 0915  piperacillin-tazobactam (ZOSYN) IVPB 4.5 g     4.5 g 200 mL/hr over 30 Minutes Intravenous  Once 03/11/16 0904 03/11/16 1135      Subjective:   Valerie Perez seen and examined today.  Patient states she feels better today. Complains of right and left shoulder pain today. Feels she slept on her left shoulder.  Denies shortness of breath, abdominal pain,  nausea or vomiting, headache, dizziness.    Objective:   Vitals:   03/23/16 0336 03/23/16 0506 03/23/16 0644 03/23/16 0755  BP: (!) 140/53 (!) 179/57  (!) 166/63  Pulse: (!) 57  60 61  Resp: 20  (!)  23 (!) 23  Temp: 98.7 F (37.1 C)   98.1 F (36.7 C)  TempSrc: Axillary   Oral  SpO2: 93%  95% 97%  Weight: 100 kg (220 lb 7.4 oz)     Height:        Intake/Output Summary (Last 24 hours) at 03/23/16 1135 Last data filed at 03/23/16 0800  Gross per 24 hour  Intake             1176 ml  Output             4001 ml  Net            -2825 ml   Filed Weights   03/22/16 0905 03/22/16 1250 03/23/16 0336  Weight: 103 kg (227 lb 1.2 oz) 99 kg (218 lb 4.1 oz) 100 kg (220 lb 7.4 oz)    Exam  General: Well developed, well nourished, NAD  HEENT: NCAT, mucous membranes moist.   Cardiovascular: S1 S2 auscultated, no murmurs, RRR  Respiratory: Clear to auscultation bilaterally   Abdomen: Soft, obese, nontender, nondistended, + bowel sounds  Extremities: warm dry without cyanosis clubbing.   Neuro: AAOx3, nonfocal  Psych: Normal affect and demeanor, pleasant   Data Reviewed: I have personally reviewed following labs and imaging studies  CBC:  Recent Labs Lab 03/19/16 0255 03/19/16 1613 03/20/16 0014 03/22/16 0400 03/23/16 0152  WBC 8.6 10.2 8.9 9.2 10.8*  HGB 8.3* 9.0* 8.5* 7.7* 8.3*  HCT 26.0* 28.4* 26.5* 24.1* 26.2*  MCV 88.1 88.5 88.3 86.7 87.9  PLT 349 413* 417* 513* 637*   Basic Metabolic Panel:  Recent Labs Lab 03/18/16 0500 03/19/16 0255 03/20/16 0014 03/22/16 0400 03/23/16 0152  NA 136 134* 133* 132* 132*  K 4.2 4.1 4.3 5.2* 5.3*  CL 102 96* 95* 92* 94*  CO2 21* 26 26 22 24   GLUCOSE 85 117* 144* 97 94  BUN 71* 42* 35* 103* 61*  CREATININE 8.27* 5.32* 4.13* 8.38* 5.96*  CALCIUM 8.8* 9.0 9.2 9.7 9.7   GFR: Estimated Creatinine Clearance: 12.2 mL/min (by C-G formula based on SCr of 5.96 mg/dL (H)). Liver Function Tests: No results for input(s): AST, ALT, ALKPHOS, BILITOT, PROT, ALBUMIN in the last 168 hours. No results for input(s): LIPASE, AMYLASE in the last 168 hours. No results for input(s): AMMONIA in the last 168 hours. Coagulation  Profile:  Recent Labs Lab 03/19/16 0255 03/20/16 0014 03/21/16 0313 03/22/16 0400 03/23/16 0152  INR 1.18 1.14 1.27 1.59 1.82   Cardiac Enzymes: No results for input(s): CKTOTAL, CKMB, CKMBINDEX, TROPONINI in the last 168 hours. BNP (last 3 results) No results for input(s): PROBNP in the last 8760 hours. HbA1C: No results for input(s): HGBA1C in the last 72 hours. CBG:  Recent Labs Lab 03/19/16 1958 03/19/16 2353 03/20/16 0348 03/20/16 0743 03/20/16 1112  GLUCAP 168* 138* 101* 77 115*   Lipid Profile: No results for input(s): CHOL, HDL, LDLCALC, TRIG, CHOLHDL, LDLDIRECT in the last 72 hours. Thyroid Function Tests: No results for input(s): TSH, T4TOTAL, FREET4, T3FREE, THYROIDAB in the last 72 hours. Anemia Panel: No results for input(s): VITAMINB12, FOLATE, FERRITIN, TIBC, IRON, RETICCTPCT in the last 72 hours. Urine analysis:    Component Value Date/Time   COLORURINE YELLOW 04/28/2015  2025   APPEARANCEUR CLEAR 04/28/2015 2025   LABSPEC 1.010 04/28/2015 2025   PHURINE 8.5 (H) 04/28/2015 2025   GLUCOSEU 250 (A) 04/28/2015 2025   HGBUR SMALL (A) 04/28/2015 2025   BILIRUBINUR NEGATIVE 04/28/2015 2025   KETONESUR NEGATIVE 04/28/2015 2025   PROTEINUR >300 (A) 04/28/2015 2025   NITRITE NEGATIVE 04/28/2015 2025   LEUKOCYTESUR NEGATIVE 04/28/2015 2025   Sepsis Labs: @LABRCNTIP (procalcitonin:4,lacticidven:4)  ) No results found for this or any previous visit (from the past 240 hour(s)).    Radiology Studies: Dg Shoulder Right  Result Date: 03/21/2016 CLINICAL DATA:  Acute onset of right shoulder pain. Initial encounter. EXAM: RIGHT SHOULDER - 2+ VIEW COMPARISON:  None. FINDINGS: There is no evidence of fracture or dislocation. The right humeral head is seated within the glenoid fossa. Minimal degenerative change is noted at the right acromioclavicular joint. No significant soft tissue abnormalities are seen. The visualized portions of the right lung are clear. A  right-sided dual-lumen catheter is partially imaged. IMPRESSION: No evidence of fracture or dislocation. Electronically Signed   By: Roanna Raider M.D.   On: 03/21/2016 23:08     Scheduled Meds: . amLODipine  10 mg Oral Daily  . atorvastatin  80 mg Oral q1800  . cloNIDine  0.1 mg Oral TID  . darbepoetin (ARANESP) injection - DIALYSIS  150 mcg Intravenous Q Tue-HD  . doxycycline  100 mg Oral Q12H  . feeding supplement (NEPRO CARB STEADY)  237 mL Oral QHS  . ferric gluconate (FERRLECIT/NULECIT) IV  125 mg Intravenous Q T,Th,Sa-HD  . fluticasone  1 spray Each Nare Daily  . gabapentin  300 mg Oral QHS  . hydrALAZINE  100 mg Oral Q8H  . isosorbide mononitrate  30 mg Oral Daily  . labetalol  200 mg Oral BID  . levothyroxine  75 mcg Oral QAC breakfast  . loratadine  10 mg Oral Q48H  . montelukast  10 mg Oral QHS  . polyethylene glycol  17 g Oral Daily  . predniSONE  5 mg Oral Q breakfast  . Warfarin - Pharmacist Dosing Inpatient   Does not apply q1800   Continuous Infusions: . heparin 2,200 Units/hr (03/23/16 0815)     LOS: 12 days   Time Spent in minutes   30 minutes  Alexsis Kathman D.O. on 03/23/2016 at 11:35 AM  Between 7am to 7pm - Pager - [phone]  After 7pm go to [email or web address] - password [email or web address]  And look for the night coverage person covering for me after hours  [facility] Office  [phone]

## 2016-03-24 LAB — CBC
HCT: 27 % — ABNORMAL LOW (ref 36.0–46.0)
Hemoglobin: 8.5 g/dL — ABNORMAL LOW (ref 12.0–15.0)
MCH: 28.1 pg (ref 26.0–34.0)
MCHC: 31.5 g/dL (ref 30.0–36.0)
MCV: 89.4 fL (ref 78.0–100.0)
Platelets: 421 10*3/uL — ABNORMAL HIGH (ref 150–400)
RBC: 3.02 MIL/uL — ABNORMAL LOW (ref 3.87–5.11)
RDW: 19 % — AB (ref 11.5–15.5)
WBC: 11.2 10*3/uL — ABNORMAL HIGH (ref 4.0–10.5)

## 2016-03-24 LAB — GLUCOSE, CAPILLARY
GLUCOSE-CAPILLARY: 114 mg/dL — AB (ref 65–99)
Glucose-Capillary: 82 mg/dL (ref 65–99)
Glucose-Capillary: 105 mg/dL — ABNORMAL HIGH (ref 65–99)
Glucose-Capillary: 106 mg/dL — ABNORMAL HIGH (ref 65–99)

## 2016-03-24 LAB — HEPARIN LEVEL (UNFRACTIONATED): HEPARIN UNFRACTIONATED: 0.14 [IU]/mL — AB (ref 0.30–0.70)

## 2016-03-24 LAB — PROTIME-INR
INR: 2.37
Prothrombin Time: 26.3 seconds — ABNORMAL HIGH (ref 11.4–15.2)

## 2016-03-24 LAB — BASIC METABOLIC PANEL
Anion gap: 21 — ABNORMAL HIGH (ref 5–15)
BUN: 101 mg/dL — AB (ref 6–20)
CALCIUM: 10.3 mg/dL (ref 8.9–10.3)
CHLORIDE: 91 mmol/L — AB (ref 101–111)
CO2: 18 mmol/L — ABNORMAL LOW (ref 22–32)
CREATININE: 7.97 mg/dL — AB (ref 0.44–1.00)
GFR calc non Af Amer: 5 mL/min — ABNORMAL LOW (ref >60)
GFR, EST AFRICAN AMERICAN: 6 mL/min — AB (ref >60)
Glucose, Bld: 78 mg/dL (ref 65–99)
Potassium: 6 mmol/L — ABNORMAL HIGH (ref 3.5–5.1)
SODIUM: 130 mmol/L — AB (ref 135–145)

## 2016-03-24 LAB — POTASSIUM: POTASSIUM: 5.8 mmol/L — AB (ref 3.5–5.1)

## 2016-03-24 MED ORDER — CLONIDINE HCL 0.2 MG PO TABS
0.2000 mg | ORAL_TABLET | Freq: Three times a day (TID) | ORAL | Status: DC
  Administered 2016-03-24 – 2016-03-25 (×3): 0.2 mg via ORAL
  Filled 2016-03-24 (×3): qty 1

## 2016-03-24 MED ORDER — WARFARIN SODIUM 2.5 MG PO TABS
2.5000 mg | ORAL_TABLET | Freq: Once | ORAL | Status: AC
Start: 2016-03-24 — End: 2016-03-24
  Administered 2016-03-24: 2.5 mg via ORAL
  Filled 2016-03-24: qty 1

## 2016-03-24 MED ORDER — SODIUM POLYSTYRENE SULFONATE 15 GM/60ML PO SUSP
30.0000 g | Freq: Once | ORAL | Status: AC
  Administered 2016-03-24: 30 g via ORAL
  Filled 2016-03-24: qty 120

## 2016-03-24 MED FILL — Medication: Qty: 1 | Status: AC

## 2016-03-24 NOTE — Progress Notes (Signed)
PROGRESS NOTE    Valerie Perez  WUJ:[id number] DOB: Oct 12, 1960 DOA: 03/11/2016 PCP: Pcp Not In System   Chief Complaint  Patient presents with  . Cardiac Arrest     Brief Narrative:  56 year old female with end-stage renal disease on Hemodialysis, suffered VF/PEA cardiac arrest for in total just over 45 minutes. Potassium found to be greater than 7.5. Pt was admitted to the pulmonary critical care team with plans for normothermia protocol due to prolonged downtime, fluctuant abnormalities, refractory shock.Now with RLE DVT, on coumadin, bridging with IV heparin.  Assessment & Plan   Acute hypoxemic hypercarbic respiratory failure  -secondary to aspiration pneumonia and cardiac arrest  -Stable respiratory status -Extubated 1/18  -currently on room air, stable  Cardiogenic shock secondary to cardiac arrest / Elevated troponin  -In the setting of hyperkalemia in pt with ESRD. 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 -Nephrology consulted and appreciated -potassium 6 despite giving kayexalate.  Will give additional dose and repeat Potassium (5.8).   -Will await for nephrology recommendations.  Chronic Abdominal Wound -Seen by wound care- dressing in place, shallow and healing, well not infected  R Popliteal DVT -On heparin drip,started Coumadin 03/18/2016 -INR subtherapeutic 2.37 today  Possible Aspiration PNA -Completed 7 days on Augmentin 03/17/16   Acute encephalopathy -Had confusion on 1/19 -Appears to be at baseline today  Hypertensive urgency -Continueclonidine, hydralazine, Imdur, labetalol and Norvasc  -mostly volume mediated in the patient with ESRD  Dyslipidemia -Continue Lipitor   Hypothyroidism -Continue synthroid   OSA on  CPAP -Stable   Anemia of chronic disease -Hemoglobin currently 8.5, continue to monitor CBC  Deconditioning -PT consulted recommended Home health  Right arm pain -Xray showed no fracture or dislocation -Continue PT  DVT Prophylaxis  Heparin/coumadin  Code Status: Full  Family Communication: None at bedside.  Disposition Plan: Admitted. Pending treatment and improvement of hyperkalemia.  Consultants Cardiology PCCM Nephrology   Procedures / Significant Events 1/16 Cardiac arrest 1/16 Echocardiogram EF60-65% 1/18 LE doppler 1/18 Extubated 1/19 LHC  Antibiotics   Anti-infectives    Start     Dose/Rate Route Frequency Ordered Stop   03/23/16 1000  doxycycline (VIBRA-TABS) tablet 100 mg  Status:  Discontinued     100 mg Oral Every 12 hours 03/23/16 0752 03/24/16 1201   03/14/16 2200  amoxicillin-clavulanate (AUGMENTIN) 500-125 MG per tablet 500 mg     1 tablet Oral Daily 03/14/16 0930 03/17/16 2251   03/11/16 1800  piperacillin-tazobactam (ZOSYN) IVPB 3.375 g  Status:  Discontinued    Comments:  CRRT dosing   3.375 g 100 mL/hr over 30 Minutes Intravenous Every 6 hours 03/11/16 1615 03/14/16 0928   03/11/16 1700  vancomycin (VANCOCIN) IVPB 1000 mg/200 mL premix  Status:  Discontinued     1,000 mg 200 mL/hr over 60 Minutes Intravenous Every 24 hours 03/11/16 1615 03/14/16 0928   03/11/16 0915  vancomycin (VANCOCIN) IVPB 1000 mg/200 mL premix     1,000 mg 200 mL/hr over 60 Minutes Intravenous  Once 03/11/16 0904 03/11/16 1211   03/11/16 0915  piperacillin-tazobactam (ZOSYN) IVPB 4.5 g     4.5 g 200 mL/hr over 30 Minutes Intravenous  Once 03/11/16 0904 03/11/16 1135      Subjective:   Valerie Perez seen and examined today.  Patient states she feels better today. Denies shortness  of breath, abdominal pain, nausea or vomiting, headache, dizziness.    Objective:   Vitals:   03/23/16 1639 03/23/16 2100 03/23/16 2313 03/24/16 0500  BP: (!) 141/52 (!) 155/57  (!)  174/60  Pulse:  63 65 (!) 51  Resp:  (!) 28 20 16   Temp:  97.3 F (36.3 C)  97.8 F (36.6 C)  TempSrc:      SpO2:  93%  97%  Weight:    99.3 kg (219 lb)  Height:        Intake/Output Summary (Last 24 hours) at 03/24/16 1344 Last data filed at 03/24/16 1216  Gross per 24 hour  Intake           1032.2 ml  Output               50 ml  Net            982.2 ml   Filed Weights   03/22/16 1250 03/23/16 0336 03/24/16 0500  Weight: 99 kg (218 lb 4.1 oz) 100 kg (220 lb 7.4 oz) 99.3 kg (219 lb)    Exam  General: Well developed, well nourished, NAD  HEENT: NCAT, mucous membranes moist.   Cardiovascular: S1 S2 auscultated, no murmurs, RRR  Respiratory: Clear to auscultation bilaterally   Abdomen: Soft, obese, nontender, nondistended, + bowel sounds  Extremities: warm dry without cyanosis clubbing.   Neuro: AAOx3, nonfocal  Psych: Normal affect and demeanor, pleasant   Data Reviewed: I have personally reviewed following labs and imaging studies  CBC:  Recent Labs Lab 03/19/16 1613 03/20/16 0014 03/22/16 0400 03/23/16 0152 03/24/16 0415  WBC 10.2 8.9 9.2 10.8* 11.2*  HGB 9.0* 8.5* 7.7* 8.3* 8.5*  HCT 28.4* 26.5* 24.1* 26.2* 27.0*  MCV 88.5 88.3 86.7 87.9 89.4  PLT 413* 417* 513* 637* 421*   Basic Metabolic Panel:  Recent Labs Lab 03/19/16 0255 03/20/16 0014 03/22/16 0400 03/23/16 0152 03/24/16 0415 03/24/16 1250  NA 134* 133* 132* 132* 130*  --   K 4.1 4.3 5.2* 5.3* 6.0* 5.8*  CL 96* 95* 92* 94* 91*  --   CO2 26 26 22 24  18*  --   GLUCOSE 117* 144* 97 94 78  --   BUN 42* 35* 103* 61* 101*  --   CREATININE 5.32* 4.13* 8.38* 5.96* 7.97*  --   CALCIUM 9.0 9.2 9.7 9.7 10.3  --    GFR: Estimated Creatinine Clearance: 9.1 mL/min (by C-G formula based on SCr of 7.97 mg/dL (H)). Liver Function Tests: No results for input(s): AST, ALT, ALKPHOS, BILITOT, PROT, ALBUMIN in the last 168 hours. No results for input(s): LIPASE, AMYLASE in the last 168 hours. No  results for input(s): AMMONIA in the last 168 hours. Coagulation Profile:  Recent Labs Lab 03/20/16 0014 03/21/16 0313 03/22/16 0400 03/23/16 0152 03/24/16 0636  INR 1.14 1.27 1.59 1.82 2.37   Cardiac Enzymes: No results for input(s): CKTOTAL, CKMB, CKMBINDEX, TROPONINI in the last 168 hours. BNP (last 3 results) No results for input(s): PROBNP in the last 8760 hours. HbA1C: No results for input(s): HGBA1C in the last 72 hours. CBG:  Recent Labs Lab 03/20/16 1112 03/23/16 1625 03/23/16 2128 03/24/16 0726 03/24/16 1107  GLUCAP 115* 153* 113* 82 105*   Lipid Profile: No results for input(s): CHOL, HDL, LDLCALC, TRIG, CHOLHDL, LDLDIRECT in the last 72 hours. Thyroid Function Tests: No results for input(s): TSH, T4TOTAL, FREET4, T3FREE, THYROIDAB in the last 72 hours. Anemia Panel: No results for input(s): VITAMINB12,  FOLATE, FERRITIN, TIBC, IRON, RETICCTPCT in the last 72 hours. Urine analysis:    Component Value Date/Time   COLORURINE YELLOW 04/28/2015 2025   APPEARANCEUR CLEAR 04/28/2015 2025   LABSPEC 1.010 04/28/2015 2025   PHURINE 8.5 (H) 04/28/2015 2025   GLUCOSEU 250 (A) 04/28/2015 2025   HGBUR SMALL (A) 04/28/2015 2025   BILIRUBINUR NEGATIVE 04/28/2015 2025   KETONESUR NEGATIVE 04/28/2015 2025   PROTEINUR >300 (A) 04/28/2015 2025   NITRITE NEGATIVE 04/28/2015 2025   LEUKOCYTESUR NEGATIVE 04/28/2015 2025   Sepsis Labs: @LABRCNTIP (procalcitonin:4,lacticidven:4)  ) No results found for this or any previous visit (from the past 240 hour(s)).    Radiology Studies: No results found.   Scheduled Meds: . amLODipine  10 mg Oral Daily  . atorvastatin  80 mg Oral q1800  . cloNIDine  0.2 mg Oral TID  . darbepoetin (ARANESP) injection - DIALYSIS  150 mcg Intravenous Q Tue-HD  . feeding supplement (NEPRO CARB STEADY)  237 mL Oral QHS  . ferric gluconate (FERRLECIT/NULECIT) IV  125 mg Intravenous Q T,Th,Sa-HD  . fluticasone  1 spray Each Nare Daily  .  gabapentin  300 mg Oral QHS  . hydrALAZINE  100 mg Oral Q8H  . isosorbide mononitrate  30 mg Oral Daily  . labetalol  200 mg Oral BID  . levothyroxine  75 mcg Oral QAC breakfast  . loratadine  10 mg Oral Q48H  . montelukast  10 mg Oral QHS  . polyethylene glycol  17 g Oral Daily  . predniSONE  5 mg Oral Q breakfast  . sodium polystyrene  30 g Oral Once  . warfarin  2.5 mg Oral ONCE-1800  . Warfarin - Pharmacist Dosing Inpatient   Does not apply q1800   Continuous Infusions:    LOS: 13 days   Time Spent in minutes   30 minutes  Valerie Perez D.O. on 03/24/2016 at 1:44 PM  Between 7am to 7pm - Pager - [phone]  After 7pm go to [email or web address] - password [email or web address]  And look for the night coverage person covering for me after hours  [facility] Office  [phone]

## 2016-03-24 NOTE — Progress Notes (Signed)
Physical Therapy Treatment Patient Details Name: Valerie Perez MRN: [id number] DOB: Feb 25, 1960 Today's Date: 03/24/2016    History of Present Illness Pt is a 56 y/o female admitted from home via EMS secondary to weakness. In ED, pt went into V-fib arrest and coded a second time in ED. Pt was intubated from 03/11/16 to 03/13/16. 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    PT Comments    Pt making gradual progress with PT regarding mobility. Pt able to ambulate 140 ft, initially without an assistive device but requiring hand held assistance due to fatigue and increased instability. Recommended use of rw at this time for safety. Pt did verbalize agreement. Regarding rt shoulder pain, pt states that it is doing much better, no need to address at this time. Pt to continue to follow, anticipate D/C to home with family support following acute stay.   Follow Up Recommendations  Home health PT;Supervision for mobility/OOB     Equipment Recommendations  None recommended by PT    Recommendations for Other Services       Precautions / Restrictions Precautions Precautions: Fall Restrictions Weight Bearing Restrictions: No    Mobility  Bed Mobility Overal bed mobility: Needs Assistance Bed Mobility: Supine to Sit     Supine to sit: Min assist Sit to supine: Supervision   General bed mobility comments: encouraging logroll technique   Transfers Overall transfer level: Needs assistance   Transfers: Sit to/from Stand Sit to Stand: Min guard         General transfer comment: performed from bed and toilet  Ambulation/Gait Ambulation/Gait assistance: Min guard;Min assist Ambulation Distance (Feet): 140 Feet Assistive device: None;1 person hand held assist Gait Pattern/deviations: Step-through pattern Gait velocity: decreased   General Gait Details: Pt requesting ambulation without device. With  increased fatigue pt had decreased stability and utilized HHAX1 to return to room safety. Recommended use of rw for ambulation at this time. Pt in agreement.    Stairs Stairs:  (reports feeling confident with going up 1 step into her home)          Wheelchair Mobility    Modified Rankin (Stroke Patients Only)       Balance Overall balance assessment: Needs assistance Sitting-balance support: Feet supported;No upper extremity supported Sitting balance-Leahy Scale: Good     Standing balance support: During functional activity Standing balance-Leahy Scale: Fair Standing balance comment: able to stand static and ambulate short distances without assistive device.                     Cognition Arousal/Alertness: Awake/alert Behavior During Therapy: WFL for tasks assessed/performed Overall Cognitive Status: Within Functional Limits for tasks assessed                      Exercises      General Comments General comments (skin integrity, edema, etc.): Pt demonstrates WFL active Rt shoulder flexion, states shoulder is doing better, no need to address at this time. Pt with elevated potassium, cleared for activity by nursing.       Pertinent Vitals/Pain Pain Assessment: Faces Faces Pain Scale: Hurts even more Pain Location: chest Pain Descriptors / Indicators: Sore Pain Intervention(s): Limited activity within patient's tolerance;Monitored during session    Home Living                      Prior Function  PT Goals (current goals can now be found in the care plan section) Acute Rehab PT Goals Patient Stated Goal: get home PT Goal Formulation: With patient Time For Goal Achievement: 04/02/16 Potential to Achieve Goals: Good Progress towards PT goals: Progressing toward goals    Frequency    Min 3X/week      PT Plan Current plan remains appropriate    Co-evaluation             End of Session Equipment Utilized During  Treatment: Gait belt Activity Tolerance: Patient limited by fatigue Patient left: in bed;with call bell/phone within reach (declined sitting in chair after ambulation. )     Time: 1610-9604 PT Time Calculation (min) (ACUTE ONLY): 37 min  Charges:  $Gait Training: 8-22 mins $Therapeutic Activity: 8-22 mins                    G Codes:      Christiane Ha, PT, CSCS Pager [phone] Office [phone]  03/24/2016, 3:48 PM

## 2016-03-24 NOTE — Progress Notes (Signed)
ANTICOAGULATION CONSULT NOTE - Follow Up Consult  Pharmacy Consult for heparin bridge to warfarin Indication: RLE DVT  Allergies  Allergen Reactions  . Tape Other (See Comments)    Tears skin  . 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    Patient Measurements: Height: 5\' 5"  (165.1 cm) Weight: 219 lb (99.3 kg) IBW/kg (Calculated) : 57 Heparin Dosing Weight: 82 kg  Vital Signs: Temp: 97.8 F (36.6 C) (01/29 0500) BP: 174/60 (01/29 0500) Pulse Rate: 51 (01/29 0500)  Labs:  Recent Labs  03/22/16 0400  03/23/16 0152 03/23/16 1027 03/24/16 0415 03/24/16 0636  HGB 7.7*  --  8.3*  --  8.5*  --   HCT 24.1*  --  26.2*  --  27.0*  --   PLT 513*  --  637*  --  421*  --   LABPROT 19.1*  --  21.3*  --   --  26.3*  INR 1.59  --  1.82  --   --  2.37  HEPARINUNFRC  --   < > 0.25* 0.50  --  0.14*  CREATININE 8.38*  --  5.96*  --  7.97*  --   < > = values in this interval not displayed.  Estimated Creatinine Clearance: 9.1 mL/min (by C-G formula based on SCr of 7.97 mg/dL (H)). On HD  Assessment: Patient is on heparin bridge to warfarin for new onset DLE DVT.   Heparin level low this AM at 0.14. INR therapeutic at 2.37 - large jump in INR of 0.55 points after 10mg  warfarin x4 days and new start Doxycycline on 1/28.  Discussed patient with Dr. Catha GosselinMikhail - stopping Heparin and Doxycycline today.   Goal of Therapy:  INR 2-3  Heparin level 0.3-0.7 Monitor platelets by anticoagulation protocol: Yes   Plan:  Discontinue heparin. Reduce warfarin to dose of 2.5 mg x1 tonight (or before discharge today).  If patient to go home, recommend starting lower dose of warfarin 5mg  po daily tomorrow and rechecking INR in 2-3 days.  Daily INR Monitor for s/sx bleeding  Link SnufferJessica Zoya Sprecher, PharmD, BCPS Clinical Pharmacist Clinical phone 03/24/2016 until 3:30 PM - [phone] After hours, please call #[phone] 03/24/16 11:56 AM

## 2016-03-24 NOTE — Progress Notes (Signed)
[facility] Progress Note    Assessment/ Plan:   1. Cardiac arrest (Vfib/ PEA/ hyperkalemia severe) - sp cooling protocol. Recovered well. Heart cath no sig CAD.  2. DVT leg - on IV hep, coumadin, awaiting rise in INR  3. Hyperkalemia - high K on admit. DCd lisinopril with hx high K+. Have educated patient  4. ESRD HD TTS, sp CRRT - Plan on HD tomorrow - high K likely bec of low flows using a 17ga needle w/ return through the catheter - We will give a trial w/ 16 ga needles x2 --> improved blood flows will result in better K clearance. 5. Volume - is already 3 kg under dry wt 6. HTN - bp's good on norvasc/ clon/ labet/ hydral 7. 2HPTH - stable 8. DM 9. Hx failed renal tx 10. Debility - PT working w pt  Subjective:   No complaints and she's asking to go home. Denies f/c/n/v/dyspnea/cp/cough.   Objective:   BP (!) 141/60   Pulse 61   Temp 97.9 F (36.6 C) (Oral)   Resp 17   Ht 5\' 5"  (1.651 m)   Wt 99.3 kg (219 lb)   SpO2 98%   BMI 36.44 kg/m   Intake/Output Summary (Last 24 hours) at 03/24/16 1440 Last data filed at 03/24/16 1216  Gross per 24 hour  Intake           1032.2 ml  Output               50 ml  Net            982.2 ml   Weight change: -3.662 kg (-8 lb 1.2 oz)  Physical Exam: Alert, obese WF in no distress No jvd Chest clear bilat RRR no mrg; beautiful rt BCF which has been superficialized Abd soft ntnd obese, healing open wound SP area, no drainage Ext +edema LE's Neuro A & Ox 3  Imaging: No results found.  Labs: BMET  Recent Labs Lab 03/18/16 0500 03/19/16 0255 03/20/16 0014 03/22/16 0400 03/23/16 0152 03/24/16 0415 03/24/16 1250  NA 136 134* 133* 132* 132* 130*  --   K 4.2 4.1 4.3 5.2* 5.3* 6.0* 5.8*  CL 102 96* 95* 92* 94* 91*  --   CO2 21* 26 26 22 24  18*  --   GLUCOSE 85 117* 144* 97 94 78  --   BUN 71* 42* 35* 103* 61* 101*  --   CREATININE 8.27* 5.32* 4.13* 8.38* 5.96* 7.97*  --   CALCIUM 8.8* 9.0 9.2 9.7 9.7  10.3  --    CBC  Recent Labs Lab 03/20/16 0014 03/22/16 0400 03/23/16 0152 03/24/16 0415  WBC 8.9 9.2 10.8* 11.2*  HGB 8.5* 7.7* 8.3* 8.5*  HCT 26.5* 24.1* 26.2* 27.0*  MCV 88.3 86.7 87.9 89.4  PLT 417* 513* 637* 421*    Medications:    . amLODipine  10 mg Oral Daily  . atorvastatin  80 mg Oral q1800  . cloNIDine  0.2 mg Oral TID  . darbepoetin (ARANESP) injection - DIALYSIS  150 mcg Intravenous Q Tue-HD  . feeding supplement (NEPRO CARB STEADY)  237 mL Oral QHS  . ferric gluconate (FERRLECIT/NULECIT) IV  125 mg Intravenous Q T,Th,Sa-HD  . fluticasone  1 spray Each Nare Daily  . gabapentin  300 mg Oral QHS  . hydrALAZINE  100 mg Oral Q8H  . isosorbide mononitrate  30 mg Oral Daily  . labetalol  200 mg Oral BID  . levothyroxine  75 mcg Oral QAC breakfast  . loratadine  10 mg Oral Q48H  . montelukast  10 mg Oral QHS  . polyethylene glycol  17 g Oral Daily  . predniSONE  5 mg Oral Q breakfast  . sodium polystyrene  30 g Oral Once  . warfarin  2.5 mg Oral ONCE-1800  . Warfarin - Pharmacist Dosing Inpatient   Does not apply q1800      Paulene FloorJames Amabel Stmarie, MD 03/24/2016, 2:40 PM

## 2016-03-24 NOTE — Care Management (Addendum)
Case Management Note Initial Note Started by Dorcas Carroweborah Dowell. RN, BSN 03-13-16 Patient Details  Name: Valerie Perez MRN: [id number] Date of Birth: [birth year]  Subjective/Objective:         Adm w cardiac arrest           Action/Plan: lives w fam, esrd on hd   Expected Discharge Date:                         Expected Discharge Plan:  Home w Home Health Services  In-House Referral:N/A     Discharge planning Services  CM Consult  Post Acute Care Choice:  Resumption of Svcs/PTA Provider Choice offered to:   Patient  DME Arranged: N/A   DME Agency:   N/A  HH Arranged:  RN, PT [facility]  Status of Service:  In process, will continue to follow  If discussed at Long Length of Stay Meetings, dates discussed:  03-25-16  Additional Comments: 03-24-16 1437 Tomi BambergerBrenda Graves-Bigelow, RN,BSN [phone] Pt is currently active with [facility] for RN Services. Previously at home with wound vac. Pt states RN is doing wet to dry dressing now. Please order HHRN and state in order if wet to dry will continue. Pt will need additional order for HHPT as well. Pt has DME RW and cane. No DME needs at this time. Pt is from home with husband and he will provide transportation home. Pt with Potassium of 6 Kayexalate given. Nephrology to consult. HD Schedule T,Th,S. CM did make additional referral with [facility]. SOC to begin within 24-48 hours post d/c.   03-13-16 has home wound vac, act w [facility] for hhrn. Mary w [facility] called to let us know they follow pt.

## 2016-03-25 DIAGNOSIS — I824Z1 Acute embolism and thrombosis of unspecified deep veins of right distal lower extremity: Secondary | ICD-10-CM

## 2016-03-25 LAB — BASIC METABOLIC PANEL
Anion gap: 18 — ABNORMAL HIGH (ref 5–15)
BUN: 127 mg/dL — ABNORMAL HIGH (ref 6–20)
CALCIUM: 9.9 mg/dL (ref 8.9–10.3)
CHLORIDE: 93 mmol/L — AB (ref 101–111)
CO2: 22 mmol/L (ref 22–32)
CREATININE: 9.18 mg/dL — AB (ref 0.44–1.00)
GFR, EST AFRICAN AMERICAN: 5 mL/min — AB (ref >60)
GFR, EST NON AFRICAN AMERICAN: 4 mL/min — AB (ref >60)
Glucose, Bld: 105 mg/dL — ABNORMAL HIGH (ref 65–99)
Potassium: 5.3 mmol/L — ABNORMAL HIGH (ref 3.5–5.1)
SODIUM: 133 mmol/L — AB (ref 135–145)

## 2016-03-25 LAB — CBC
HCT: 24.9 % — ABNORMAL LOW (ref 36.0–46.0)
HEMOGLOBIN: 7.9 g/dL — AB (ref 12.0–15.0)
MCH: 27.5 pg (ref 26.0–34.0)
MCHC: 31.7 g/dL (ref 30.0–36.0)
MCV: 86.8 fL (ref 78.0–100.0)
Platelets: 636 10*3/uL — ABNORMAL HIGH (ref 150–400)
RBC: 2.87 MIL/uL — ABNORMAL LOW (ref 3.87–5.11)
RDW: 18.1 % — AB (ref 11.5–15.5)
WBC: 9.8 10*3/uL (ref 4.0–10.5)

## 2016-03-25 LAB — POTASSIUM: Potassium: 3.8 mmol/L (ref 3.5–5.1)

## 2016-03-25 LAB — GLUCOSE, CAPILLARY
GLUCOSE-CAPILLARY: 87 mg/dL (ref 65–99)
GLUCOSE-CAPILLARY: 132 mg/dL — AB (ref 65–99)

## 2016-03-25 LAB — PROTIME-INR
INR: 2.68
PROTHROMBIN TIME: 29.1 s — AB (ref 11.4–15.2)

## 2016-03-25 MED ORDER — ISOSORBIDE MONONITRATE ER 30 MG PO TB24: 30.0000 mg | ORAL_TABLET | Freq: Every day | ORAL | 0 refills | Status: DC

## 2016-03-25 MED ORDER — WARFARIN SODIUM 5 MG PO TABS
7.0000 mg | ORAL_TABLET | Freq: Once | ORAL | Status: AC
  Administered 2016-03-25: 20:00:00 7 mg via ORAL
  Filled 2016-03-25: qty 1

## 2016-03-25 MED ORDER — SODIUM CHLORIDE 0.9 % IV SOLN
250.0000 mg | INTRAVENOUS | Status: DC
  Administered 2016-03-25: 250 mg via INTRAVENOUS
  Filled 2016-03-25 (×2): qty 20

## 2016-03-25 MED ORDER — LABETALOL HCL 200 MG PO TABS: 200.0000 mg | ORAL_TABLET | Freq: Two times a day (BID) | ORAL | Status: DC

## 2016-03-25 MED ORDER — HEPARIN SODIUM (PORCINE) 1000 UNIT/ML DIALYSIS: 2200.0000 [IU] | Freq: Once | INTRAMUSCULAR | Status: DC

## 2016-03-25 MED ORDER — DARBEPOETIN ALFA 150 MCG/0.3ML IJ SOSY
PREFILLED_SYRINGE | INTRAMUSCULAR | Status: AC
  Filled 2016-03-25: qty 0.3

## 2016-03-25 MED ORDER — WARFARIN SODIUM 5 MG PO TABS: 5.0000 mg | ORAL_TABLET | Freq: Every day | ORAL | 0 refills | Status: DC

## 2016-03-25 MED ORDER — HYDRALAZINE HCL 100 MG PO TABS: 100.0000 mg | ORAL_TABLET | Freq: Three times a day (TID) | ORAL | 0 refills | Status: DC

## 2016-03-25 MED ORDER — NEPRO/CARBSTEADY PO LIQD: 237.0000 mL | Freq: Every day | ORAL | 0 refills | Status: DC

## 2016-03-25 MED ORDER — CLONIDINE HCL 0.2 MG PO TABS: 0.2000 mg | ORAL_TABLET | Freq: Three times a day (TID) | ORAL | 0 refills | Status: DC

## 2016-03-25 NOTE — Discharge Summary (Addendum)
Physician Discharge Summary  Valerie Perez ZOX:[id number] DOB: 03/21/1960 DOA: 03/11/2016  PCP: Pcp Not In System  Admit date: 03/11/2016 Discharge date: 03/25/2016  Time spent: 45 minutes  Recommendations for Outpatient Follow-up:  Patient will be discharged to home with home health physical therapy and nursing.  Patient will need to follow up with primary care provider within one week of discharge. Repeat INR in 2 days. Continue dialysis as scheduled.  Patient should continue medications as prescribed.  Patient should follow a Renal/carb modified diet.   Discharge Diagnoses:  Acute hypoxemic hypercarbic respiratory failure  Cardiogenic shock secondary to cardiac arrest / Elevated troponin  ESRD on dialysis TTS/ Severe hyperkalemia Chronic Abdominal Wound R Popliteal DVT Possible Aspiration PNA Acute encephalopathy Hypertensive urgency Dyslipidemia Hypothyroidism OSA on CPAP Anemia of chronic disease Deconditioning Right arm pain  Discharge Condition: Stable  Diet recommendation: Renal/carb modified  Filed Weights   03/25/16 0616 03/25/16 1455 03/25/16 1900  Weight: 102.1 kg (225 lb) 100.5 kg (221 lb 9 oz) 98.5 kg (217 lb 2.5 oz)    History of present illness:  56 year old female with end-stage renal disease on Hemodialysis, suffered VF/PEA cardiac arrest for in total just over 45 minutes. Potassium found to be greater than 7.5. Pt was admitted to the pulmonary critical care team with plans for normothermia protocol due to prolonged downtime, fluctuant abnormalities, refractory shock.Now with RLE DVT, on coumadin, bridging with IV heparin.   Hospital Course:  Acute hypoxemic hypercarbic respiratory failure  -secondary to aspiration pneumonia and cardiac arrest  -Stable respiratory status -Extubated 1/18  -currently on room air, stable  Cardiogenic shock secondary to cardiac arrest / Elevated troponin  -In the setting of hyperkalemia in pt with ESRD.  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 -Nephrology consulted and appreciated -Hyperkalemia improved -Avoid ACEi/ARB -Spoke with nephrology, Dr. Juel Burrow. Hyperkalemia possibly due to low flows due to smaller needed. Plan to use patient's access today  -Potassium after HD 3.8  Chronic Abdominal Wound -Seen by wound care- dressing in place, shallow and healing, well not infected  R Popliteal DVT -On heparin drip,started Coumadin 03/18/2016 -INR subtherapeutic 2.68 today -To be monitored and followed by PCP -Recheck INR in 2 days  Possible Aspiration PNA -Completed 7 days on Augmentin 03/17/16   Acute encephalopathy -Had confusion on 1/19 -Appears to be at baseline today  Hypertensive urgency -Continueclonidine, hydralazine, Imdur, labetalol and Norvasc  -mostly volume mediated in the patient with ESRD  Dyslipidemia -Continue Lipitor   Hypothyroidism -Continue synthroid   OSA on CPAP -Stable   Anemia of chronic disease -Hemoglobin currently 7.9  Deconditioning -PT consulted recommended Home health  Right arm pain -Xray showed no fracture or dislocation -Continue PT   Consultants Cardiology PCCM Nephrology   Procedures / Significant Events 1/16 Cardiac arrest 1/16 Echocardiogram EF60-65% 1/18 LE doppler 1/18 Extubated 1/19 LHC  Discharge Exam: Vitals:   03/25/16 1951 03/25/16 2102  BP: (!) 170/65 (!) 173/65  Pulse: 71 70  Resp: 20   Temp: 98.3 F (36.8 C)    Patient states she feels better today. Denies shortness of breath, abdominal pain, nausea or vomiting, headache, dizziness.    Exam  General: Well developed, well nourished, NAD  HEENT: NCAT, mucous membranes moist. Bump on the posterior right neck  Cardiovascular:  S1 S2 auscultated, no murmurs, RRR  Respiratory: Clear to auscultation bilaterally   Abdomen: Soft, obese, nontender, nondistended, + bowel sounds  Extremities: warm dry without cyanosis clubbing.   Neuro: AAOx3, nonfocal  Psych: Normal affect and demeanor, pleasant  Discharge Instructions  Current Discharge Medication List    START taking these medications   Details  cloNIDine (CATAPRES) 0.2 MG tablet Take 1 tablet (0.2 mg total) by mouth 3 (three) times daily. Qty: 90 tablet, Refills: 0    hydrALAZINE (APRESOLINE) 100 MG tablet Take 1 tablet (100 mg total) by mouth every 8 (eight) hours. Qty: 90 tablet, Refills: 0    isosorbide mononitrate (IMDUR) 30 MG 24 hr tablet Take 1 tablet (30 mg total) by mouth daily. Qty: 30 tablet, Refills: 0    Nutritional Supplements (FEEDING SUPPLEMENT, NEPRO CARB STEADY,) LIQD Take 237 mLs by mouth at bedtime. Qty: 30 Can, Refills: 0    warfarin (COUMADIN) 5 MG tablet Take 1 tablet (5 mg total) by mouth daily at 6 PM. Qty: 30 tablet, Refills: 0      CONTINUE these medications which have CHANGED   Details  labetalol (NORMODYNE) 200 MG tablet Take 1 tablet (200 mg total) by mouth 2 (two) times daily.      CONTINUE these medications which have NOT CHANGED   Details  allopurinol (ZYLOPRIM) 300 MG tablet Take 300 mg by mouth daily.    amLODipine (NORVASC) 5 MG tablet Take 10 mg by mouth daily.    buPROPion (WELLBUTRIN XL) 150 MG 24 hr tablet Take 150 mg by mouth at bedtime.    gabapentin (NEURONTIN) 100 MG capsule Take 300 mg by mouth at bedtime.    levothyroxine (SYNTHROID, LEVOTHROID) 75 MCG tablet Take 1 tablet (75 mcg total) by mouth daily before breakfast. Qty: 30 tablet, Refills: 0    montelukast (SINGULAIR) 10 MG tablet Take 10 mg by mouth at bedtime.    acetaminophen (TYLENOL) 500 MG tablet Take 500 mg by mouth every 6 (six) hours as needed for moderate pain.    DULoxetine (CYMBALTA) 60 MG capsule Take 60 mg by mouth at  bedtime.    fluticasone (FLONASE) 50 MCG/ACT nasal spray Place 1 spray into both nostrils every morning.    insulin glargine (LANTUS) 100 UNIT/ML injection Inject 15 Units into the skin 2 (two) times daily.     insulin lispro (HUMALOG) 100 UNIT/ML injection Inject 12 Units into the skin 3 (three) times daily before meals. Sliding scale: 12 units with each meal. Add 1 unit every 25 point BG > 150. Ex: BG 175, add 1 unit = 13 unit; BG 200, add 2 unit = 14 unit.    loratadine (CLARITIN) 10 MG tablet Take 10 mg by mouth every morning.    oxyCODONE-acetaminophen (PERCOCET/ROXICET) 5-325 MG tablet Take 1 tablet by mouth every 6 (six) hours as needed. Qty: 15 tablet, Refills: 0    polyethylene glycol (MIRALAX / GLYCOLAX) packet Take 17 g by mouth daily.    pravastatin (PRAVACHOL) 40 MG tablet Take 40 mg by mouth at bedtime.    predniSONE (DELTASONE) 5 MG tablet Take 5 mg by mouth daily with breakfast.    SENSIPAR 30 MG tablet Take 1 tablet by mouth daily.    sevelamer carbonate (RENVELA) 800 MG tablet Take 1,600 mg by mouth 3 (three) times daily with meals.       STOP taking these medications     carvedilol (COREG) 12.5 MG tablet      losartan (COZAAR) 25 MG tablet  aspirin EC 81 MG tablet        Allergies  Allergen Reactions  . Tape Other (See Comments)    Tears skin  . 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   Follow-up Information    Primary care physician. Schedule an appointment as soon as possible for a visit in 1 week(s).   Why:  Hospital follow up           The results of significant diagnostics from this hospitalization (including imaging, microbiology, ancillary and laboratory) are listed below for reference.    Significant Diagnostic Studies: Dg Shoulder Right  Result Date: 03/21/2016 CLINICAL DATA:  Acute onset of right shoulder pain. Initial encounter. EXAM: RIGHT SHOULDER - 2+ VIEW COMPARISON:  None. FINDINGS: There is no  evidence of fracture or dislocation. The right humeral head is seated within the glenoid fossa. Minimal degenerative change is noted at the right acromioclavicular joint. No significant soft tissue abnormalities are seen. The visualized portions of the right lung are clear. A right-sided dual-lumen catheter is partially imaged. IMPRESSION: No evidence of fracture or dislocation. Electronically Signed   By: Roanna Raider M.D.   On: 03/21/2016 23:08   Ct Head Wo Contrast  Result Date: 03/11/2016 CLINICAL DATA:  Dialysis patient, vomiting, recent CPR EXAM: CT HEAD WITHOUT CONTRAST TECHNIQUE: Contiguous axial images were obtained from the base of the skull through the vertex without intravenous contrast. COMPARISON:  None available FINDINGS: Brain: Mild brain atrophy without acute intracranial hemorrhage, mass lesion, definite infarction, midline shift, herniation, hydrocephalus, or extra-axial fluid collection. Normal gray-white matter differentiation. Cisterns remain patent. No cerebellar abnormality. Vascular: Intracranial atherosclerosis noted. Skull: Normal. Negative for fracture or focal lesion. Sinuses/Orbits: Orbits are symmetric. Scattered pansinus disease with small diffuse air-fluid levels can be seen with sinusitis. Other: None. IMPRESSION: Mild brain atrophy.  No acute intracranial process. Scattered sinus mucosal thickening and numerous small air-fluid levels suggesting sinusitis. Electronically Signed   By: Judie Petit.  Shick M.D.   On: 03/11/2016 12:18   Dg Chest Port 1 View  Result Date: 03/16/2016 CLINICAL DATA:  Respiratory failure EXAM: PORTABLE CHEST 1 VIEW COMPARISON:  March 13, 2016 FINDINGS: The left central line has been removed. The right central line is stable. Stable cardiomegaly and mild edema. Opacity in the left retrocardiac region is more prominent in the interval. No other interval change. IMPRESSION: 1. Cardiomegaly and probable mild edema. 2. Left retrocardiac opacity is identified,  more prominent in the the interval. Electronically Signed   By: Gerome Sam III M.D   On: 03/16/2016 08:33   Dg Chest Port 1 View  Result Date: 03/13/2016 CLINICAL DATA:  Cardiac arrest. Difficulty breathing. History of diabetes and hypertension. EXAM: PORTABLE CHEST 1 VIEW COMPARISON:  03/12/2016 FINDINGS: There is a left IJ catheter with tip in the projection of the SVC. There is a right subclavian dialysis catheter with tips in the projection of the cavoatrial junction. Moderate cardiac enlargement. Pulmonary vascular congestion identified. IMPRESSION: 1. Cardiac enlargement and pulmonary vascular congestion. Electronically Signed   By: Signa Kell M.D.   On: 03/13/2016 13:15   Dg Chest Port 1 View  Result Date: 03/12/2016 CLINICAL DATA:  Hypoxia EXAM: PORTABLE CHEST 1 VIEW COMPARISON:  March 11, 2016 FINDINGS: Endotracheal tube tip is 3.1 cm above the carina. Central catheter tip is at the cavoatrial junction. Nasogastric tube tip and side port below the diaphragm. No pneumothorax. There are bilateral pleural effusions with mild pulmonary edema ; there  is slightly less interstitial pulmonary edema compared to 1 day prior. There is stable cardiomegaly with mild pulmonary venous hypertension. No new opacity. No evident adenopathy. No bone lesions. IMPRESSION: Tube and catheter positions as described without pneumothorax. Slightly less edema compared to 1 day prior. There are pleural effusions bilaterally as well as cardiomegaly and pulmonary venous hypertension consistent with a degree of persistent congestive heart failure. No new opacity. Electronically Signed   By: Bretta Bang III M.D.   On: 03/12/2016 07:40   Dg Chest Port 1 View  Result Date: 03/11/2016 CLINICAL DATA:  Central line placement EXAM: PORTABLE CHEST 1 VIEW COMPARISON:  03/11/2016 FINDINGS: Endotracheal tube in good position. Right jugular dual lumen central venous catheter tip in the lower SVC unchanged Interval  placement of left jugular central venous catheter with the tip in the mid SVC. No pneumothorax. Diffuse bilateral airspace disease shows mild interval improvement especially in the right upper lobe. There remains significant consolidation in both lung bases. No significant pleural effusion. NG tube in the stomach. IMPRESSION: Left jugular central venous catheter tip in the SVC. No pneumothorax. Diffuse bilateral airspace disease shows partial clearing in the upper lobes but remains significant in the lower lobes. Endotracheal tube remains in good position. Electronically Signed   By: Marlan Palau M.D.   On: 03/11/2016 17:23   Dg Chest Portable 1 View  Result Date: 03/11/2016 CLINICAL DATA:  Endotracheal tube placement.  Vomiting at home. EXAM: PORTABLE CHEST 1 VIEW COMPARISON:  04/28/2015 FINDINGS: Endotracheal tube tip below the clavicular heads. Dialysis catheter on the right with tip at the upper right atrium. Fairly symmetric widespread advanced airspace disease. No visible pleural effusion or pneumothorax. Cardiopericardial enlargement. Upper mediastinal width is distorted by leftward rotation. There could also be vascular pedicle thickening in the setting of CHF. IMPRESSION: 1. Endotracheal tube in good position. 2. Advanced airspace disease, bilaterality favoring pulmonary edema. Aspiration/pneumonia is also considered given history of vomiting. Electronically Signed   By: Marnee Spring M.D.   On: 03/11/2016 09:05   Dg Knee Right Port  Result Date: 03/13/2016 CLINICAL DATA:  Pt with right knee and lower leg pain today with bruising after cardiac arrest yesterday. Pt woke today complaining of pain. Lateral right tibia images included on right knee. EXAM: PORTABLE RIGHT KNEE - 1-2 VIEW COMPARISON:  None. FINDINGS: No fracture of the proximal tibia or distal femur. Patella is normal. No joint effusion. IMPRESSION: No fracture or dislocation. Electronically Signed   By: Genevive Bi M.D.   On:  03/13/2016 13:10   Dg Tibia/fibula Right Port  Result Date: 03/13/2016 CLINICAL DATA:  right knee and lower leg pain EXAM: PORTABLE RIGHT TIBIA AND FIBULA - 2 VIEW COMPARISON:  None. FINDINGS: There is mild diffuse soft tissue swelling. There is no underlying fracture or subluxation. Mild degenerative changes are noted within the right knee. IMPRESSION: 1. No acute bone abnormality. 2. Soft tissue swelling. Electronically Signed   By: Signa Kell M.D.   On: 03/13/2016 13:14   Dg Abd Portable 1 View  Result Date: 03/11/2016 CLINICAL DATA:  Orogastric tube placement EXAM: PORTABLE ABDOMEN - 1 VIEW COMPARISON:  March 02, 2015 FINDINGS: Orogastric tube tip and side port in stomach. Visualized bowel gas pattern unremarkable. There are surgical clips in the right upper quadrant region. No free air evident. IMPRESSION: Orogastric tube tip and side port in stomach. Visualized bowel gas pattern unremarkable. Electronically Signed   By: Bretta Bang III M.D.   On: 03/11/2016  11:01    Microbiology: No results found for this or any previous visit (from the past 240 hour(s)).   Labs: Basic Metabolic Panel:  Recent Labs Lab 03/20/16 0014 03/22/16 0400 03/23/16 0152 03/24/16 0415 03/24/16 1250 03/25/16 0431 03/25/16 1914  NA 133* 132* 132* 130*  --  133*  --   K 4.3 5.2* 5.3* 6.0* 5.8* 5.3* 3.8  CL 95* 92* 94* 91*  --  93*  --   CO2 26 22 24  18*  --  22  --   GLUCOSE 144* 97 94 78  --  105*  --   BUN 35* 103* 61* 101*  --  127*  --   CREATININE 4.13* 8.38* 5.96* 7.97*  --  9.18*  --   CALCIUM 9.2 9.7 9.7 10.3  --  9.9  --    Liver Function Tests: No results for input(s): AST, ALT, ALKPHOS, BILITOT, PROT, ALBUMIN in the last 168 hours. No results for input(s): LIPASE, AMYLASE in the last 168 hours. No results for input(s): AMMONIA in the last 168 hours. CBC:  Recent Labs Lab 03/20/16 0014 03/22/16 0400 03/23/16 0152 03/24/16 0415 03/25/16 0431  WBC 8.9 9.2 10.8* 11.2* 9.8    HGB 8.5* 7.7* 8.3* 8.5* 7.9*  HCT 26.5* 24.1* 26.2* 27.0* 24.9*  MCV 88.3 86.7 87.9 89.4 86.8  PLT 417* 513* 637* 421* 636*   Cardiac Enzymes: No results for input(s): CKTOTAL, CKMB, CKMBINDEX, TROPONINI in the last 168 hours. BNP: BNP (last 3 results) No results for input(s): BNP in the last 8760 hours.  ProBNP (last 3 results) No results for input(s): PROBNP in the last 8760 hours.  CBG:  Recent Labs Lab 03/24/16 1107 03/24/16 1609 03/24/16 2114 03/25/16 0739 03/25/16 1142  GLUCAP 105* 114* 106* 87 132*       Signed:  Yecheskel Kurek  [facility] 03/25/2016, 9:17 PM

## 2016-03-25 NOTE — Progress Notes (Signed)
[facility] Progress Note    Assessment/ Plan:   1. Cardiac arrest (Vfib/ PEA/ hyperkalemia severe) - sp cooling protocol. Recovered well. Heart cath no sig CAD.  2. DVT leg - on IV hep, coumadin, awaiting rise in INR  3. Hyperkalemia - high K on admit. DCd lisinopril with hx high K+. Have educated patient  4. ESRD HD TTS, sp CRRT - Plan on HD today - high K likely bec of low flows using a 17ga needle w/ return through the catheter - We will give a trial w/ 16 ga needles x2 --> improved blood flows will result in better K clearance. - Hopefully we can get the catheter out in a week. 5. Volume - is already 3 kg under dry wt 6. HTN - bp's good on norvasc/ clon/ labet/ hydral 7. 2HPTH - stable 8. Anemia - changed Nulecit to 250mg  with each HD x 3 (only rx 125mg  x1 so far) 9. DM 10. Hx failed renal tx 11. Debility - PT working w pt  Subjective:   No complaints and she's asking to go home. Denies f/c/n/v/dyspnea/cp/cough.   Objective:   BP (!) 144/55 (BP Location: Left Wrist)   Pulse 60   Temp 98.1 F (36.7 C) (Oral)   Resp 20   Ht 5\' 5"  (1.651 m)   Wt 102.1 kg (225 lb)   SpO2 96%   BMI 37.44 kg/m   Intake/Output Summary (Last 24 hours) at 03/25/16 0955 Last data filed at 03/25/16 0400  Gross per 24 hour  Intake              480 ml  Output               50 ml  Net              430 ml   Weight change: 2.722 kg (6 lb)  Physical Exam: Alert, obese WF in no distress No jvd Chest clear bilat RRR no mrg; beautiful rt BCF which has been superficialized Abd soft ntnd obese, healing open wound SP area, no drainage Ext +edema LE's Neuro A & Ox 3  Imaging: No results found.  Labs: BMET  Recent Labs Lab 03/19/16 0255 03/20/16 0014 03/22/16 0400 03/23/16 0152 03/24/16 0415 03/24/16 1250 03/25/16 0431  NA 134* 133* 132* 132* 130*  --  133*  K 4.1 4.3 5.2* 5.3* 6.0* 5.8* 5.3*  CL 96* 95* 92* 94* 91*  --  93*  CO2 26 26 22 24  18*  --  22  GLUCOSE  117* 144* 97 94 78  --  105*  BUN 42* 35* 103* 61* 101*  --  127*  CREATININE 5.32* 4.13* 8.38* 5.96* 7.97*  --  9.18*  CALCIUM 9.0 9.2 9.7 9.7 10.3  --  9.9   CBC  Recent Labs Lab 03/22/16 0400 03/23/16 0152 03/24/16 0415 03/25/16 0431  WBC 9.2 10.8* 11.2* 9.8  HGB 7.7* 8.3* 8.5* 7.9*  HCT 24.1* 26.2* 27.0* 24.9*  MCV 86.7 87.9 89.4 86.8  PLT 513* 637* 421* 636*    Medications:    . amLODipine  10 mg Oral Daily  . atorvastatin  80 mg Oral q1800  . cloNIDine  0.2 mg Oral TID  . darbepoetin (ARANESP) injection - DIALYSIS  150 mcg Intravenous Q Tue-HD  . feeding supplement (NEPRO CARB STEADY)  237 mL Oral QHS  . ferric gluconate (FERRLECIT/NULECIT) IV  250 mg Intravenous Q T,Th,Sa-HD  . fluticasone  1 spray Each Nare Daily  .  gabapentin  300 mg Oral QHS  . hydrALAZINE  100 mg Oral Q8H  . isosorbide mononitrate  30 mg Oral Daily  . labetalol  200 mg Oral BID  . levothyroxine  75 mcg Oral QAC breakfast  . loratadine  10 mg Oral Q48H  . montelukast  10 mg Oral QHS  . polyethylene glycol  17 g Oral Daily  . predniSONE  5 mg Oral Q breakfast  . Warfarin - Pharmacist Dosing Inpatient   Does not apply [id number]      Paulene Floor, MD 03/25/2016, 9:55 AM

## 2016-03-25 NOTE — Progress Notes (Signed)
Spoke with Dr. Catha GosselinMikhail, patient's potassium has resulted at 3.8 and ok to discharge patient at this time.

## 2016-03-25 NOTE — Progress Notes (Signed)
ANTICOAGULATION CONSULT NOTE - Follow Up Consult  Pharmacy Consult for warfarin Indication: RLE DVT  Allergies  Allergen Reactions  . Tape Other (See Comments)    Tears skin  . 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    Patient Measurements: Height: 5\' 5"  (165.1 cm) Weight: 225 lb (102.1 kg) IBW/kg (Calculated) : 57  Vital Signs: Temp: 98.1 F (36.7 C) (01/30 0616) Temp Source: Oral (01/30 0616) BP: 144/55 (01/30 0616) Pulse Rate: 60 (01/30 0616)  Labs:  Recent Labs  03/23/16 0152 03/23/16 1027 03/24/16 0415 03/24/16 0636 03/25/16 0431  HGB 8.3*  --  8.5*  --  7.9*  HCT 26.2*  --  27.0*  --  24.9*  PLT 637*  --  421*  --  636*  LABPROT 21.3*  --   --  26.3* 29.1*  INR 1.82  --   --  2.37 2.68  HEPARINUNFRC 0.25* 0.50  --  0.14*  --   CREATININE 5.96*  --  7.97*  --  9.18*    Estimated Creatinine Clearance: 8 mL/min (by C-G formula based on SCr of 9.18 mg/dL (H)). On HD  Assessment: Patient is on warfarin for new onset DLE DVT. S/p heparin  INR therapeutic at 2.68 - large jump in INR after 10mg  warfarin x4 days and new start Doxycycline on 1/28-29.   Goal of Therapy:  INR 2-3  Heparin level 0.3-0.7 Monitor platelets by anticoagulation protocol: Yes   Plan:  Give warfarin 7 mg x 1 tonight (or before discharge today).  If patient to go home, recommend starting dose of warfarin 7 mg po daily and rechecking INR in 2 days.  Monitor daily INR, CBC, clinical course, s/sx of bleed, PO intake, DDI   Thank you for allowing us to participate in this patients care.  Signe Coltonya C Jessina Marse, PharmD Clinical phone for 03/25/2016 from 7a-3:30p: [phone] If after 3:30p, please call main pharmacy at: [phone] 03/25/2016 11:45 AM

## 2016-03-26 MED ORDER — WARFARIN SODIUM 5 MG PO TABS: 5.0000 mg | ORAL_TABLET | Freq: Every day | ORAL | 0 refills | Status: DC

## 2016-03-26 MED ORDER — CLONIDINE HCL 0.2 MG PO TABS: 0.2000 mg | ORAL_TABLET | Freq: Three times a day (TID) | ORAL | 0 refills | Status: DC

## 2016-03-26 MED ORDER — NEPRO/CARBSTEADY PO LIQD: 237.0000 mL | Freq: Every day | ORAL | 0 refills | Status: DC

## 2016-03-26 MED ORDER — ISOSORBIDE MONONITRATE ER 30 MG PO TB24: 30.0000 mg | ORAL_TABLET | Freq: Every day | ORAL | 0 refills | Status: DC

## 2016-03-26 MED ORDER — LABETALOL HCL 200 MG PO TABS: 200.0000 mg | ORAL_TABLET | Freq: Two times a day (BID) | ORAL | Status: DC

## 2016-03-26 MED ORDER — HYDRALAZINE HCL 100 MG PO TABS: 100.0000 mg | ORAL_TABLET | Freq: Three times a day (TID) | ORAL | 0 refills | Status: DC

## 2016-03-28 NOTE — Progress Notes (Addendum)
Late entry:  RN reviewed discharge instructions with patient and patient's significant other, answering all questions.  IV removed, telemetry box removed and telemetry notified of patient's discharge.  RN wheeled patient via wheelchair to hospital main entrance and assisted patient into significant other's truck.  Patient took all belongings at discharge.

## 2016-05-15 ENCOUNTER — Encounter: Payer: Self-pay | Admitting: Internal Medicine

## 2016-05-16 ENCOUNTER — Ambulatory Visit (INDEPENDENT_AMBULATORY_CARE_PROVIDER_SITE_OTHER): Payer: Commercial Managed Care - PPO | Admitting: Internal Medicine

## 2016-05-16 ENCOUNTER — Encounter: Payer: Self-pay | Admitting: Internal Medicine

## 2016-05-16 VITALS — BP 114/70 | HR 61 | Ht 65.0 in | Wt 209.0 lb

## 2016-05-16 DIAGNOSIS — G4733 Obstructive sleep apnea (adult) (pediatric): Secondary | ICD-10-CM | POA: Diagnosis not present

## 2016-05-16 DIAGNOSIS — Z940 Kidney transplant status: Secondary | ICD-10-CM | POA: Diagnosis not present

## 2016-05-16 NOTE — Assessment & Plan Note (Signed)
She has been very compliant with CPAP still using her original machine but needing replacement. She recognizes using the machine prevents her snoring that allows her to sleep better. She would like to try a different mask style and I will have her new machine set for auto titration range 5-15. We discussed comfort issues and communication with her DME company.

## 2016-05-16 NOTE — Progress Notes (Signed)
05/16/2016-56 year old female never smoker for sleep medicine evaluation.Former patient LOV 2011;. currently uses  CPAP through [employer] of 12.                Pt needs Rx for new machine with supplies.  NPSG 04/25/2005- AHI 51.8/ hr, desaturation to  73%, CPAP titrated to 12, body weight 220 lbs Medical problems include anemia, history cardiac arrest, history DVT, DM 2, history aspiration pneumonia, ESRD/ HD/ transplant failed , hypothyroid, HBP, Hospital 1/6-1/30/18- cardiac arrest/shock with K 7.5, /acute hypoxic hypercapneic respiratory failure, ESRD/dialysis, right popliteal DVT She says she has never had a break in CPAP therapy. Download confirms good compliance ( didn't record while using hospital machine during hospitalization in January). Husband sleep in same bed if she doesn't wear CPAP-it does prevent her snoring and she sleeps well using it. No ENT surgery, no lung diagnosis other than an aspiration pneumonia event in the past. Gets her dialysis in [place].  Prior to Admission medications   Medication Sig Start Date End Date Taking? Authorizing Provider  acetaminophen (TYLENOL) 500 MG tablet Take 500 mg by mouth every 6 (six) hours as needed for moderate pain.   Yes Historical Provider, MD  allopurinol (ZYLOPRIM) 300 MG tablet Take 300 mg by mouth daily.   Yes Historical Provider, MD  amLODipine (NORVASC) 5 MG tablet Take 10 mg by mouth daily.   Yes Historical Provider, MD  aspirin EC 81 MG tablet Take 81 mg by mouth daily. 01/14/16  Yes Historical Provider, MD  buPROPion (WELLBUTRIN XL) 150 MG 24 hr tablet Take 150 mg by mouth 2 (two) times daily.    Yes Historical Provider, MD  cloNIDine (CATAPRES) 0.2 MG tablet Take 1 tablet (0.2 mg total) by mouth 3 (three) times daily. 03/26/16  Yes Maryann Mikhail, DO  COLCRYS 0.6 MG tablet Take 1 tablet BID prn 04/15/16  Yes Historical Provider, MD  DULoxetine (CYMBALTA) 60 MG capsule Take 60 mg by mouth at bedtime.   Yes  Historical Provider, MD  fluticasone (FLONASE) 50 MCG/ACT nasal spray Place 1 spray into both nostrils every morning.   Yes Historical Provider, MD  gabapentin (NEURONTIN) 100 MG capsule Take 300 mg by mouth at bedtime.   Yes Historical Provider, MD  hydrALAZINE (APRESOLINE) 100 MG tablet Take 1 tablet (100 mg total) by mouth every 8 (eight) hours. 03/26/16  Yes Maryann Mikhail, DO  isosorbide mononitrate (IMDUR) 30 MG 24 hr tablet Take 1 tablet (30 mg total) by mouth daily. 03/26/16  Yes Maryann Mikhail, DO  levothyroxine (SYNTHROID, LEVOTHROID) 75 MCG tablet Take 1 tablet (75 mcg total) by mouth daily before breakfast. 04/30/15  Yes Jana Half, MD  loratadine (CLARITIN) 10 MG tablet Take 10 mg by mouth every morning.   Yes Historical Provider, MD  montelukast (SINGULAIR) 10 MG tablet Take 10 mg by mouth at bedtime.   Yes Historical Provider, MD  Nutritional Supplements (FEEDING SUPPLEMENT, NEPRO CARB STEADY,) LIQD Take 237 mLs by mouth at bedtime. 03/26/16  Yes Maryann Mikhail, DO  polyethylene glycol (MIRALAX / GLYCOLAX) packet Take 17 g by mouth daily.   Yes Historical Provider, MD  rosuvastatin (CRESTOR) 20 MG tablet Take 20 mg by mouth daily. 04/02/16  Yes Historical Provider, MD  SENSIPAR 30 MG tablet Take 1 tablet by mouth daily. 07/30/15  Yes Historical Provider, MD  sevelamer carbonate (RENVELA) 800 MG tablet Take 1,600 mg by mouth 3 (three) times daily with meals.    Yes Historical Provider, MD  warfarin (  COUMADIN) 5 MG tablet Take 1 tablet (5 mg total) by mouth daily at 6 PM. 03/26/16  Yes Edsel Petrin, DO   Past Medical History:  Diagnosis Date  . Anemia   . Arthritis    osteoarthritis  . Depression   . Diabetes mellitus without complication (HCC)    Type II  . Dyspnea    with exertion  . Fatty liver   . Heart murmur    just recently told she had a heart murmur, but had never heard this before  . High cholesterol   . Hypertension   . Hypothyroid   . Neuropathy (HCC)   .  Pneumonia   . PONV (postoperative nausea and vomiting)   . Renal disorder    dialysis on Tuesday/Thursday/Saturday  . Sleep apnea    uses cpap   Past Surgical History:  Procedure Laterality Date  . ABDOMINAL HYSTERECTOMY    . AV FISTULA PLACEMENT Right 10/03/2015   Procedure: CREATION-RIGHT BRACHIOCEPHALIC ARTERIOVENOUS (AV) FISTULA;  Surgeon: Fransisco Hertz, MD;  Location: [facility] OR;  Service: Vascular;  Laterality: Right;  . CARDIAC CATHETERIZATION N/A 03/14/2016   Procedure: Left Heart Cath and Coronary Angiography;  Surgeon: Yvonne Kendall, MD;  Location: [facility] INVASIVE CV LAB;  Service: Cardiovascular;  Laterality: N/A;  . CHOLECYSTECTOMY  2000  . COLONOSCOPY    . DEBRIDEMENT OF ABDOMINAL WALL ABSCESS  01/14/2016  . FISTULA SUPERFICIALIZATION Right 01/30/2016   Procedure: FISTULA SUPERFICIALIZATION RIGHT UPPER ARM;  Surgeon: Fransisco Hertz, MD;  Location: [facility] OR;  Service: Vascular;  Laterality: Right;  . KIDNEY TRANSPLANT  2000   Family History  Problem Relation Age of Onset  . Colon cancer Mother   . Thyroid cancer Father    Social History   Social History  . Marital status: Married    Spouse name: N/A  . Number of children: N/A  . Years of education: N/A   Occupational History  . Not on file.   Social History Main Topics  . Smoking status: Never Smoker  . Smokeless tobacco: Never Used  . Alcohol use No  . Drug use: No  . Sexual activity: Not on file   Other Topics Concern  . Not on file   Social History Narrative  . No narrative on file   ROS-see HPI   Negative unless "+" Constitutional:    weight loss, night sweats, fevers, chills, fatigue, lassitude. HEENT:    headaches, difficulty swallowing, tooth/dental problems, sore throat,       sneezing, itching, ear ache, nasal congestion, post nasal drip, snoring CV:    chest pain, orthopnea, PND, swelling in lower extremities, anasarca,                                                     dizziness, palpitations Resp:    shortness of breath with exertion or at rest.                productive cough,   non-productive cough, coughing up of blood.              change in color of mucus.  wheezing.   Skin:    rash or lesions. GI:  No-   heartburn, indigestion, abdominal pain, nausea, vomiting, diarrhea,  change in bowel habits, loss of appetite GU: dysuria, change in color of urine, no urgency or frequency.   flank pain. MS:   joint pain, stiffness, decreased range of motion, back pain. Neuro-     nothing unusual Psych:  change in mood or affect.  depression or anxiety.   memory loss.  OBJ- Physical Exam General- Alert, Oriented, Affect-appropriate, Distress- none acute Skin- rash-none, lesions- none, excoriation- none Lymphadenopathy- none Head- atraumatic            Eyes- Gross vision intact, PERRLA, conjunctivae and secretions clear            Ears- Hearing, canals-normal            Nose- Clear, no-Septal dev, mucus, polyps, erosion, perforation             Throat- Mallampati IV , mucosa clear , drainage- none, tonsils-present Neck- flexible , trachea midline, no stridor , thyroid nl, carotid no bruit Chest - symmetrical excursion , unlabored           Heart/CV- RRR ,  Murmur+ 1/6 LLSB , no gallop  , no rub, nl s1 s2                           - JVD- none , edema- none, stasis changes- none, varices- none           Lung- clear to P&A, wheeze- none, cough- none , dullness-none, rub- none           Chest wall-  Abd-  Br/ Gen/ Rectal- Not done, not indicated Extrem- cyanosis- none, clubbing, none, atrophy- none, strength- nl. HD access R upper arm Neuro- grossly intact to observation

## 2016-05-16 NOTE — Patient Instructions (Signed)
Order- DME [facility]- replace old CPAP machine, change to auto 5-15, mask of choice (she wants different mask), humidifier, supplies, AirView     Dx OSA  Please call as needed

## 2016-05-21 DIAGNOSIS — I80291 Phlebitis and thrombophlebitis of other deep vessels of right lower extremity: Secondary | ICD-10-CM | POA: Diagnosis not present

## 2016-05-21 DIAGNOSIS — Z832 Family history of diseases of the blood and blood-forming organs and certain disorders involving the immune mechanism: Secondary | ICD-10-CM | POA: Diagnosis not present

## 2016-05-26 ENCOUNTER — Telehealth: Payer: Self-pay | Admitting: Internal Medicine

## 2016-05-26 NOTE — Telephone Encounter (Signed)
atc Beth at [facility], office is closed.  wcb tomorrow.

## 2016-05-27 NOTE — Telephone Encounter (Signed)
Ov note faxed 

## 2016-06-18 ENCOUNTER — Telehealth: Payer: Self-pay | Admitting: Internal Medicine

## 2016-06-18 NOTE — Telephone Encounter (Signed)
lmtcb x1 for pt. 

## 2016-06-19 NOTE — Telephone Encounter (Signed)
lmtcb for pt.  

## 2016-06-20 NOTE — Telephone Encounter (Signed)
PCCs   Pt called and stated when she called the DME they stated all they received was insurance information on this pt that was faxed, no order. Can this be re-faxed thanks

## 2016-06-20 NOTE — Telephone Encounter (Signed)
PT RETURNING CALL AND CAN BE REACHED @ [phone].Caren Griffins

## 2016-06-20 NOTE — Telephone Encounter (Signed)
Called [facility] they have all that they need from [place] as of now. Called the patient she is aware that someone should be contacting her soon.

## 2016-06-25 DIAGNOSIS — Z86718 Personal history of other venous thrombosis and embolism: Secondary | ICD-10-CM | POA: Diagnosis not present

## 2016-07-18 ENCOUNTER — Other Ambulatory Visit (HOSPITAL_COMMUNITY): Payer: Medicare Other

## 2016-07-22 ENCOUNTER — Other Ambulatory Visit: Payer: Self-pay | Admitting: Vascular Surgery

## 2016-07-22 DIAGNOSIS — N186 End stage renal disease: Secondary | ICD-10-CM

## 2016-07-25 ENCOUNTER — Ambulatory Visit: Payer: Medicare Other | Admitting: Vascular Surgery

## 2016-08-08 ENCOUNTER — Encounter: Payer: Self-pay | Admitting: Vascular Surgery

## 2016-08-19 ENCOUNTER — Ambulatory Visit (HOSPITAL_COMMUNITY)
Admission: RE | Admit: 2016-08-19 | Discharge: 2016-08-19 | Disposition: A | Discharge status: Home / Self Care | Payer: Commercial Managed Care - PPO | Source: Ambulatory Visit | Attending: Vascular Surgery | Admitting: Vascular Surgery

## 2016-08-19 DIAGNOSIS — N186 End stage renal disease: Secondary | ICD-10-CM | POA: Insufficient documentation

## 2016-08-22 ENCOUNTER — Ambulatory Visit (INDEPENDENT_AMBULATORY_CARE_PROVIDER_SITE_OTHER): Payer: Commercial Managed Care - PPO | Admitting: Vascular Surgery

## 2016-08-22 ENCOUNTER — Encounter: Payer: Self-pay | Admitting: *Deleted

## 2016-08-22 ENCOUNTER — Other Ambulatory Visit: Payer: Self-pay | Admitting: *Deleted

## 2016-08-22 ENCOUNTER — Encounter: Payer: Self-pay | Admitting: Vascular Surgery

## 2016-08-22 VITALS — BP 181/83 | HR 67 | Temp 98.1°F | Resp 20 | Ht 65.0 in | Wt 202.0 lb

## 2016-08-22 DIAGNOSIS — N186 End stage renal disease: Secondary | ICD-10-CM | POA: Diagnosis not present

## 2016-08-22 DIAGNOSIS — Z992 Dependence on renal dialysis: Secondary | ICD-10-CM | POA: Diagnosis not present

## 2016-08-22 NOTE — Progress Notes (Signed)
Established Dialysis Access   History of Present Illness   Valerie Perez is a 56 y.o. (10/13/1960) female who presents for cc: right 4th-5th finger pain on HD.  The patient is left ight hand dominant.  Previous access procedures have been completed in the right arm: R BC AVF and R BC AVF superficialization with SBL x 3.  The denies any steal sx except sharp pain in right 4th and 5th fingers during HD.  This resolves nearly instantly after HD ends.  Past Medical History:  Diagnosis Date  . Anemia   . Arthritis    osteoarthritis  . Depression   . Diabetes mellitus without complication (HCC)    Type II  . Dyspnea    with exertion  . Fatty liver   . Heart murmur    just recently told she had a heart murmur, but had never heard this before  . High cholesterol   . Hypertension   . Hypothyroid   . Neuropathy   . Pneumonia   . PONV (postoperative nausea and vomiting)   . Renal disorder    dialysis on Tuesday/Thursday/Saturday  . Sleep apnea    uses cpap    Past Surgical History:  Procedure Laterality Date  . ABDOMINAL HYSTERECTOMY    . AV FISTULA PLACEMENT Right 10/03/2015   Procedure: CREATION-RIGHT BRACHIOCEPHALIC ARTERIOVENOUS (AV) FISTULA;  Surgeon: Fransisco Hertz, MD;  Location: [facility] OR;  Service: Vascular;  Laterality: Right;  . CARDIAC CATHETERIZATION N/A 03/14/2016   Procedure: Left Heart Cath and Coronary Angiography;  Surgeon: Yvonne Kendall, MD;  Location: [facility] INVASIVE CV LAB;  Service: Cardiovascular;  Laterality: N/A;  . CHOLECYSTECTOMY  2000  . COLONOSCOPY    . DEBRIDEMENT OF ABDOMINAL WALL ABSCESS  01/14/2016  . FISTULA SUPERFICIALIZATION Right 01/30/2016   Procedure: FISTULA SUPERFICIALIZATION RIGHT UPPER ARM;  Surgeon: Fransisco Hertz, MD;  Location: [facility] OR;  Service: Vascular;  Laterality: Right;  . KIDNEY TRANSPLANT  2000    Social History   Social History  . Marital status: Married    Spouse name: N/A  . Number of children: N/A  . Years of education: N/A     Occupational History  . Not on file.   Social History Main Topics  . Smoking status: Never Smoker  . Smokeless tobacco: Never Used  . Alcohol use No  . Drug use: No  . Sexual activity: Not on file   Other Topics Concern  . Not on file   Social History Narrative  . No narrative on file    Family History  Problem Relation Age of Onset  . Colon cancer Mother   . Thyroid cancer Father     Current Outpatient Prescriptions  Medication Sig Dispense Refill  . acetaminophen (TYLENOL) 500 MG tablet Take 500 mg by mouth every 6 (six) hours as needed for moderate pain.    Marland Kitchen allopurinol (ZYLOPRIM) 300 MG tablet Take 300 mg by mouth daily.    Marland Kitchen amLODipine (NORVASC) 5 MG tablet Take 10 mg by mouth daily.    Marland Kitchen aspirin EC 81 MG tablet Take 81 mg by mouth daily.    Marland Kitchen buPROPion (WELLBUTRIN XL) 150 MG 24 hr tablet Take 150 mg by mouth 2 (two) times daily.     . cloNIDine (CATAPRES) 0.2 MG tablet Take 1 tablet (0.2 mg total) by mouth 3 (three) times daily. 90 tablet 0  . COLCRYS 0.6 MG tablet Take 1 tablet BID prn    . DULoxetine (CYMBALTA)  60 MG capsule Take 60 mg by mouth at bedtime.    . fluticasone (FLONASE) 50 MCG/ACT nasal spray Place 1 spray into both nostrils every morning.    . gabapentin (NEURONTIN) 100 MG capsule Take 300 mg by mouth at bedtime.    . hydrALAZINE (APRESOLINE) 100 MG tablet Take 1 tablet (100 mg total) by mouth every 8 (eight) hours. 90 tablet 0  . isosorbide mononitrate (IMDUR) 30 MG 24 hr tablet Take 1 tablet (30 mg total) by mouth daily. 30 tablet 0  . levothyroxine (SYNTHROID, LEVOTHROID) 75 MCG tablet Take 1 tablet (75 mcg total) by mouth daily before breakfast. 30 tablet 0  . loratadine (CLARITIN) 10 MG tablet Take 10 mg by mouth every morning.    . montelukast (SINGULAIR) 10 MG tablet Take 10 mg by mouth at bedtime.    . Nutritional Supplements (FEEDING SUPPLEMENT, NEPRO CARB STEADY,) LIQD Take 237 mLs by mouth at bedtime. 30 Can 0  . polyethylene glycol  (MIRALAX / GLYCOLAX) packet Take 17 g by mouth daily.    . rosuvastatin (CRESTOR) 20 MG tablet Take 20 mg by mouth daily.    . SENSIPAR 30 MG tablet Take 1 tablet by mouth daily.    . sevelamer carbonate (RENVELA) 800 MG tablet Take 1,600 mg by mouth 3 (three) times daily with meals.      No current facility-administered medications for this visit.      Allergies  Allergen Reactions  . Tape Other (See Comments)    Tears skin  . Atorvastatin     Other reaction(s): Cramps (ALLERGY/intolerance) Pt experienced severe leg cramping.  . 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    REVIEW OF SYSTEMS (negative unless checked):   Cardiac:  []  Chest pain or chest pressure? []  Shortness of breath upon activity? []  Shortness of breath when lying flat? []  Irregular heart rhythm?  Vascular:  []  Pain in calf, thigh, or hip brought on by walking? []  Pain in feet at night that wakes you up from your sleep? []  Blood clot in your veins? []  Leg swelling?  Pulmonary:  []  Oxygen at home? []  Productive cough? []  Wheezing?  Neurologic:  []  Sudden weakness in arms or legs? []  Sudden numbness in arms or legs? []  Sudden onset of difficult speaking or slurred speech? []  Temporary loss of vision in one eye? []  Problems with dizziness?  Gastrointestinal:  []  Blood in stool? []  Vomited blood?  Genitourinary:  []  Burning when urinating? []  Blood in urine? []  end stage renal disease-HD: T/R/S   Psychiatric:  []  Major depression  Hematologic:  []  Bleeding problems? []  Problems with blood clotting?  Dermatologic:  []  Rashes or ulcers?  Constitutional:  []  Fever or chills?  Ear/Nose/Throat:  []  Change in hearing? []  Nose bleeds? []  Sore throat?  Musculoskeletal:  []  Back pain? []  Joint pain? []  Muscle pain?   Physical Examination   Vitals:   08/22/16 1308 08/22/16 1314  BP: (!) 176/81 (!) 181/83  Pulse: 67   Resp: 20   Temp: 98.1 F (36.7 C)    TempSrc: Oral   SpO2: 97%   Weight: 202 lb (91.6 kg)   Height: 5\' 5"  (1.651 m)    Body mass index is 33.61 kg/m.  General Alert, O x 3, WD, NAD  Pulmonary Sym exp, good B air movt, CTA B  Cardiac RRR, Nl S1, S2, no Murmurs, No rubs, No S3,S4  Vascular Vessel Right Left  Radial Not  palpable Not palpable  Brachial Palpable Palpable  Ulnar Not palpable Not palpable    Musculo- skeletal M/S 5/5 throughout  , Extremities without ischemic changes    Neurologic Pain and light touch intact in extremities , Motor exam as listed above     Non-invasive Vascular Imaging   right Arm Steal Duplex  (08/19/16):   Evidence of flow reversal in ulnar artery   Medical Decision Making   Valerie Perez is a 56 y.o. female who presents with ESRD requiring hemodialysis.    The steal study wasn't done properly but there is some evidence of flow reversal that might be consistent with steal.  I would get a R arm angiogram, possible intervention to look for inflow disease that can be treated endovascularly.  I will also complete a R arm fistulogram at the same time. I discussed with the patient the nature of angiographic procedures, especially the limited patencies of any endovascular intervention.   The patient is aware of that the risks of an angiographic procedure include but are not limited to: bleeding, infection, access site complications, renal failure, embolization, rupture of vessel, dissection, arteriovenous fistula, possible need for emergent surgical intervention, possible need for surgical procedures to treat the patient's pathology, anaphylactic reaction to contrast, and stroke and death.    The patient has agreed to proceed with the above procedure which will be scheduled 16 JUL 18.   Leonides Sake, MD, FACS [facility] Office: [phone] Pager: [phone]

## 2016-09-01 ENCOUNTER — Ambulatory Visit (INDEPENDENT_AMBULATORY_CARE_PROVIDER_SITE_OTHER): Payer: Commercial Managed Care - PPO | Admitting: Internal Medicine

## 2016-09-01 ENCOUNTER — Telehealth: Payer: Self-pay | Admitting: *Deleted

## 2016-09-01 ENCOUNTER — Encounter: Payer: Self-pay | Admitting: Internal Medicine

## 2016-09-01 VITALS — BP 168/82 | HR 70 | Ht 65.0 in | Wt 200.6 lb

## 2016-09-01 DIAGNOSIS — I10 Essential (primary) hypertension: Secondary | ICD-10-CM

## 2016-09-01 DIAGNOSIS — G4733 Obstructive sleep apnea (adult) (pediatric): Secondary | ICD-10-CM | POA: Diagnosis not present

## 2016-09-01 NOTE — Telephone Encounter (Signed)
Called [facility] to make them aware of the Pt's elevated BP's while she was seen at our office today.  Spoke to a Charity fundraiserN at [facility] about the elevated BP's.  She asked me why the pt what the pt was seen at our office today for, and after my telling her of the follow-up, RN told me that she would relay the BP's to the doctor at the office.  Nothing further needed at this time.

## 2016-09-01 NOTE — Patient Instructions (Signed)
Order- DME [facility]-   Please change CPAP to fixed pressure 12, continue mask of choice, humidifier, supplies, AirView   Dx OSA  We will call your nephrologist, Dr Azalia BilisWeb's office and let them know about your blood pressure today.

## 2016-09-01 NOTE — Telephone Encounter (Signed)
After receiving a return phone call from Dr. Marland McalpineWebb's office, was told that the pt was not a pt at their office that she was a pt at the dialysis center in [place].  Was given the correct contact information for me to contact so I could relay the pt's elevated BP's to them.  Nothing further needed.

## 2016-09-01 NOTE — Progress Notes (Signed)
05/16/2016-56 year old female never smoker for sleep medicine evaluation.Former patient LOV 2011;. currently uses  CPAP through [employer] of 12.                Pt needs Rx for new machine with supplies.  NPSG 04/25/2005- AHI 51.8/ hr, desaturation to  73%, CPAP titrated to 12, body weight 220 lbs Medical problems include anemia, history cardiac arrest, history DVT, DM 2, history aspiration pneumonia, ESRD/ HD/ transplant failed , hypothyroid, HBP, Hospital 1/6-1/30/18- cardiac arrest/shock with K 7.5, /acute hypoxic hypercapneic respiratory failure, ESRD/dialysis, right popliteal DVT She says she has never had a break in CPAP therapy. Download confirms good compliance ( didn't record while using hospital machine during hospitalization in January). Husband won't sleep in same bed if she doesn't wear CPAP-it does prevent her snoring and she sleeps well using it. No ENT surgery, no lung diagnosis other than an aspiration pneumonia event in the past. Gets her dialysis in [place].  09/01/16- 56 year old female never smoker followed for OSA complicated by ESRD/dialysis, right popliteal DVT, history cardiac arrest/shock/acute hypoxic hypercapnic respiratory failure 2018 CPAP auto 5-15/[facility] Pt states that she is not sleeping as good now as she did with her other CPAP machine. Pt states that she is dreaming throughout the night and wakes up more tired. BP on arrival today 180/90, weight 200 pounds Feels more restless at night. Not sure if there is really a difference and comfort between her previous fixed CPAP pressure and current AutoPap but she thinks she would like to go back to fixed pressure 12.  ROS-see HPI   Negative unless "+" Constitutional:    weight loss, night sweats, fevers, chills, fatigue, lassitude. HEENT:    headaches, difficulty swallowing, tooth/dental problems, sore throat,       sneezing, itching, ear ache, nasal congestion, post nasal drip, snoring CV:     chest pain, orthopnea, PND, swelling in lower extremities, anasarca,                                                     dizziness, palpitations Resp:  + shortness of breath with exertion or at rest.                productive cough,   non-productive cough, coughing up of blood.              change in color of mucus.  wheezing.   Skin:    rash or lesions. GI:  No-   heartburn, indigestion, abdominal pain, nausea, vomiting, diarrhea,                 change in bowel habits, loss of appetite GU: dysuria, change in color of urine, no urgency or frequency.   flank pain. MS:   joint pain, stiffness, decreased range of motion, back pain. Neuro-     nothing unusual Psych:  change in mood or affect.  depression or anxiety.   memory loss.  OBJ- Physical Exam     stable baseline exam General- Alert, Oriented, Affect-appropriate, Distress- none acute Skin- rash-none, lesions- none, excoriation- none Lymphadenopathy- none Head- atraumatic            Eyes- Gross vision intact, PERRLA, conjunctivae and secretions clear            Ears- Hearing, canals-normal  Nose- Clear, no-Septal dev, mucus, polyps, erosion, perforation             Throat- Mallampati IV , mucosa clear , drainage- none, tonsils-present Neck- flexible , trachea midline, no stridor , thyroid nl, carotid no bruit Chest - symmetrical excursion , unlabored           Heart/CV- RRR ,  Murmur+ 1/6 LLSB , no gallop  , no rub, nl s1 s2                           - JVD- none , edema- none, stasis changes- none, varices- none           Lung- clear to P&A, wheeze- none, cough- none , dullness-none, rub- none           Chest wall-  Abd-  Br/ Gen/ Rectal- Not done, not indicated Extrem- cyanosis- none, clubbing, none, atrophy- none, strength- nl. HD access R upper arm Neuro- grossly intact to observation

## 2016-09-04 NOTE — Assessment & Plan Note (Signed)
Blood pressure was too high-180/90-on arrival today. She reports BP management is an ongoing problem. Plan-we are notifying her dialysis office of her record pressures today for their information

## 2016-09-04 NOTE — Assessment & Plan Note (Signed)
We're going to put her back to fixed CPAP 12 to see if this improves her sleep quality. I doubt that is really the problem.

## 2016-09-08 ENCOUNTER — Ambulatory Visit (HOSPITAL_COMMUNITY)
Admission: RE | Admit: 2016-09-08 | Discharge: 2016-09-08 | Disposition: A | Discharge status: Home / Self Care | Payer: Commercial Managed Care - PPO | Source: Ambulatory Visit | Attending: Vascular Surgery | Admitting: Vascular Surgery

## 2016-09-08 ENCOUNTER — Encounter (HOSPITAL_COMMUNITY)
Admission: RE | Disposition: A | Discharge status: Home / Self Care | Payer: Self-pay | Source: Ambulatory Visit | Attending: Vascular Surgery

## 2016-09-08 ENCOUNTER — Other Ambulatory Visit: Payer: Self-pay

## 2016-09-08 DIAGNOSIS — Y832 Surgical operation with anastomosis, bypass or graft as the cause of abnormal reaction of the patient, or of later complication, without mention of misadventure at the time of the procedure: Secondary | ICD-10-CM | POA: Insufficient documentation

## 2016-09-08 DIAGNOSIS — G4730 Sleep apnea, unspecified: Secondary | ICD-10-CM | POA: Insufficient documentation

## 2016-09-08 DIAGNOSIS — Z7982 Long term (current) use of aspirin: Secondary | ICD-10-CM | POA: Diagnosis not present

## 2016-09-08 DIAGNOSIS — I708 Atherosclerosis of other arteries: Secondary | ICD-10-CM | POA: Insufficient documentation

## 2016-09-08 DIAGNOSIS — E1140 Type 2 diabetes mellitus with diabetic neuropathy, unspecified: Secondary | ICD-10-CM | POA: Diagnosis not present

## 2016-09-08 DIAGNOSIS — F329 Major depressive disorder, single episode, unspecified: Secondary | ICD-10-CM | POA: Insufficient documentation

## 2016-09-08 DIAGNOSIS — Z940 Kidney transplant status: Secondary | ICD-10-CM | POA: Diagnosis not present

## 2016-09-08 DIAGNOSIS — Z992 Dependence on renal dialysis: Secondary | ICD-10-CM | POA: Insufficient documentation

## 2016-09-08 DIAGNOSIS — E7800 Pure hypercholesterolemia, unspecified: Secondary | ICD-10-CM | POA: Insufficient documentation

## 2016-09-08 DIAGNOSIS — M1990 Unspecified osteoarthritis, unspecified site: Secondary | ICD-10-CM | POA: Diagnosis not present

## 2016-09-08 DIAGNOSIS — N186 End stage renal disease: Secondary | ICD-10-CM | POA: Diagnosis not present

## 2016-09-08 DIAGNOSIS — E039 Hypothyroidism, unspecified: Secondary | ICD-10-CM | POA: Diagnosis not present

## 2016-09-08 DIAGNOSIS — I120 Hypertensive chronic kidney disease with stage 5 chronic kidney disease or end stage renal disease: Secondary | ICD-10-CM | POA: Insufficient documentation

## 2016-09-08 DIAGNOSIS — K760 Fatty (change of) liver, not elsewhere classified: Secondary | ICD-10-CM | POA: Insufficient documentation

## 2016-09-08 DIAGNOSIS — Z7951 Long term (current) use of inhaled steroids: Secondary | ICD-10-CM | POA: Diagnosis not present

## 2016-09-08 DIAGNOSIS — T82898A Other specified complication of vascular prosthetic devices, implants and grafts, initial encounter: Secondary | ICD-10-CM | POA: Diagnosis present

## 2016-09-08 DIAGNOSIS — E1122 Type 2 diabetes mellitus with diabetic chronic kidney disease: Secondary | ICD-10-CM | POA: Diagnosis not present

## 2016-09-08 DIAGNOSIS — T82510A Breakdown (mechanical) of surgically created arteriovenous fistula, initial encounter: Secondary | ICD-10-CM | POA: Diagnosis not present

## 2016-09-08 HISTORY — PX: UPPER EXTREMITY ANGIOGRAPHY: CATH118270

## 2016-09-08 LAB — POCT I-STAT, CHEM 8
BUN: 47 mg/dL — ABNORMAL HIGH (ref 6–20)
CREATININE: 8 mg/dL — AB (ref 0.44–1.00)
Calcium, Ion: 1.01 mmol/L — ABNORMAL LOW (ref 1.15–1.40)
Chloride: 97 mmol/L — ABNORMAL LOW (ref 101–111)
Glucose, Bld: 97 mg/dL (ref 65–99)
HEMATOCRIT: 30 % — AB (ref 36.0–46.0)
HEMOGLOBIN: 10.2 g/dL — AB (ref 12.0–15.0)
POTASSIUM: 4.1 mmol/L (ref 3.5–5.1)
SODIUM: 139 mmol/L (ref 135–145)
TCO2: 26 mmol/L (ref 0–100)

## 2016-09-08 SURGERY — UPPER EXTREMITY ANGIOGRAPHY
Anesthesia: LOCAL

## 2016-09-08 MED ORDER — HEPARIN (PORCINE) IN NACL 2-0.9 UNIT/ML-% IJ SOLN
INTRAMUSCULAR | Status: AC
  Filled 2016-09-08: qty 1000

## 2016-09-08 MED ORDER — HEPARIN (PORCINE) IN NACL 2-0.9 UNIT/ML-% IJ SOLN
INTRAMUSCULAR | Status: AC | PRN
  Administered 2016-09-08: 1000 mL

## 2016-09-08 MED ORDER — HYDRALAZINE HCL 20 MG/ML IJ SOLN: 10.0000 mg | INTRAMUSCULAR | Status: DC | PRN

## 2016-09-08 MED ORDER — HYDRALAZINE HCL 20 MG/ML IJ SOLN
INTRAMUSCULAR | Status: DC | PRN
  Administered 2016-09-08: 10 mg via INTRAVENOUS

## 2016-09-08 MED ORDER — FENTANYL CITRATE (PF) 100 MCG/2ML IJ SOLN
INTRAMUSCULAR | Status: AC
  Filled 2016-09-08: qty 2

## 2016-09-08 MED ORDER — SODIUM CHLORIDE 0.9% FLUSH: 3.0000 mL | INTRAVENOUS | Status: DC | PRN

## 2016-09-08 MED ORDER — SODIUM CHLORIDE 0.9 % IV SOLN: 250.0000 mL | INTRAVENOUS | Status: DC | PRN

## 2016-09-08 MED ORDER — MIDAZOLAM HCL 2 MG/2ML IJ SOLN
INTRAMUSCULAR | Status: DC | PRN
  Administered 2016-09-08: 1 mg via INTRAVENOUS

## 2016-09-08 MED ORDER — MIDAZOLAM HCL 2 MG/2ML IJ SOLN
INTRAMUSCULAR | Status: AC
  Filled 2016-09-08: qty 2

## 2016-09-08 MED ORDER — LIDOCAINE HCL (PF) 1 % IJ SOLN
INTRAMUSCULAR | Status: DC | PRN
  Administered 2016-09-08: 18 mL

## 2016-09-08 MED ORDER — SODIUM CHLORIDE 0.9% FLUSH: 3.0000 mL | Freq: Two times a day (BID) | INTRAVENOUS | Status: DC

## 2016-09-08 MED ORDER — ACETAMINOPHEN 325 MG PO TABS: 650.0000 mg | ORAL_TABLET | ORAL | Status: DC | PRN

## 2016-09-08 MED ORDER — LIDOCAINE HCL (PF) 1 % IJ SOLN
INTRAMUSCULAR | Status: AC
Start: 2016-09-08 — End: ?
  Filled 2016-09-08: qty 30

## 2016-09-08 MED ORDER — FENTANYL CITRATE (PF) 100 MCG/2ML IJ SOLN
INTRAMUSCULAR | Status: DC | PRN
  Administered 2016-09-08: 50 ug via INTRAVENOUS

## 2016-09-08 MED ORDER — IODIXANOL 320 MG/ML IV SOLN
INTRAVENOUS | Status: DC | PRN
  Administered 2016-09-08: 100 mL via INTRA_ARTERIAL

## 2016-09-08 SURGICAL SUPPLY — 13 items
CATH ANGIO 5F PIGTAIL 100CM (CATHETERS) ×2 IMPLANT
CATH HEADHUNTER H1 5F 100CM (CATHETERS) ×2 IMPLANT
CATH NAVICROSS ST .035X135CM (MICROCATHETER) ×2 IMPLANT
COVER PRB 48X5XTLSCP FOLD TPE (BAG) ×1 IMPLANT
COVER PROBE 5X48 (BAG) ×1
GUIDEWIRE ANGLED .035X260CM (WIRE) ×2 IMPLANT
KIT PV (KITS) ×2 IMPLANT
SHEATH PINNACLE 5F 10CM (SHEATH) ×2 IMPLANT
SYR MEDRAD MARK V 150ML (SYRINGE) ×2 IMPLANT
TRANSDUCER W/STOPCOCK (MISCELLANEOUS) ×2 IMPLANT
TRAY PV CATH (CUSTOM PROCEDURE TRAY) ×2 IMPLANT
WIRE BENTSON .035X145CM (WIRE) ×2 IMPLANT
WIRE HI TORQ VERSACORE J 260CM (WIRE) ×2 IMPLANT

## 2016-09-08 NOTE — Discharge Instructions (Signed)
° °  [facility] ° °Discharge Instructions ° °Lower Extremity Angiogram; Angioplasty/Stenting ° °Please refer to the following instructions for your post-procedure care. Your surgeon or physician assistant will discuss any changes with you. ° °Activity ° °Avoid lifting more than 8 pounds (1 gallons of milk) for 72 hours (3 days) after your procedure. You may walk as much as you can tolerate. It's OK to drive after 72 hours. ° °Bathing/Showering ° °You may shower the day after your procedure. If you have a bandage, you may remove it at 24- 48 hours. Clean your incision site with mild soap and water. Pat the area dry with a clean towel. ° °Diet ° °Resume your pre-procedure diet. There are no special food restrictions following this procedure. All patients with peripheral vascular disease should follow a low fat/low cholesterol diet. In order to heal from your surgery, it is CRITICAL to get adequate nutrition. Your body requires vitamins, minerals, and protein. Vegetables are the best source of vitamins and minerals. Vegetables also provide the perfect balance of protein. Processed food has little nutritional value, so try to avoid this. ° °Medications ° °Resume taking all of your medications unless your doctor tells you not to. If your incision is causing pain, you may take over-the-counter pain relievers such as acetaminophen (Tylenol) ° °Follow Up ° °Follow up will be arranged at the time of your procedure. You may have an office visit scheduled or may be scheduled for surgery. Ask your surgeon if you have any questions. ° °Please call us immediately for any of the following conditions: °•Severe or worsening pain your legs or feet at rest or with walking. °•Increased pain, redness, drainage at your groin puncture site. °•Fever of 101 degrees or higher. °•If you have any mild or slow bleeding from your puncture site: lie down, apply firm constant pressure over the area with a piece of  gauze or a clean wash cloth for 30 minutes- no peeking!, call 911 right away if you are still bleeding after 30 minutes, or if the bleeding is heavy and unmanageable. ° °Reduce your risk factors of vascular disease: ° °Stop smoking. If you would like help call [facility] at 1-800-QUIT-NOW (1-800-784-8669) or [place] at [phone]. °Manage your cholesterol °Maintain a desired weight °Control your diabetes °Keep your blood pressure down ° °If you have any questions, please call the office at [phone] ° °

## 2016-09-08 NOTE — Op Note (Addendum)
OPERATIVE NOTE   PROCEDURE: 1.  right common femoral artery cannulation under ultrasound guidance 2.  Placement of catheter in aorta 3.  Arch Aortogram 4.  Third order arterial selection 5.  Right arm angiogram via catheter 6.  Right arm fistulogram via catheter 7.  Conscious sedation 33 minutes  PRE-OPERATIVE DIAGNOSIS: possible right hand steal syndrome  POST-OPERATIVE DIAGNOSIS: same as above   SURGEON: Leonides SakeBrian Chen, MD  ANESTHESIA: local anesthesia  ESTIMATED BLOOD LOSS: 50 cc  CONTRAST: 100 cc  FINDING(S):  Type 2 aortic arch: minimal disease  Innominate artery: proximal calcification, <10% stenosis  R common carotid artery: proximal widely patent  R subclavian artery: widely patent  R vertebral artery: proximal widely patent  Left common carotid artery: proximal widely patent  Left subclavian artery: widely patent  Left vertebral artery: proximal widely patent  Widely patent right axillary, brachial, ulnar and radial arteries  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 female who presents with right hand steal symptoms on hemodialysis.  The patient presents for: arch aortogram, right arm angiogram with fistulogram.  I discussed with the patient the nature of angiographic procedures, especially the limited patencies of any endovascular intervention.  The patient is aware of that the risks of an angiographic procedure include but are not limited to: bleeding, infection, access site complications, renal failure, embolization, rupture of vessel, dissection, possible need for emergent surgical intervention,  possible need for surgical procedures to treat the patient's pathology, and stroke and death.  The patient is aware of the risks and agrees to proceed.  DESCRIPTION: After full informed consent was obtained from the patient, the patient was brought back to the angiography suite.  The patient was placed supine upon the angiography table and connected to cardiopulmonary monitoring equipment.  A circulating radiologic technician maintained continuous monitoring of the patient's cardiopulmonary status.  Additionally, the control room radiologic technician provided backup monitoring throughout the procedure.  The patient was prepped and drape in the standard fashion for an angiographic procedure.  At this point, attention was turned to the right groin.  Under ultrasound guidance,  The subcutaneous tissue surrounding the right common femoral artery was anesthesized with 1% lidocaine with epinephrine.  The artery was then cannulated with a 18 gauge needle.  The Bentson wire was passed up into the aorta.  The needle was exchanged for a 5-Fr sheath, which was advanced over the wire into the common femoral artery.  The dilator was then removed.  The long pigtail catheter was then loaded over the wire up to the level of ascending aorta.  The catheter was connected to the power injector circuit.  After de-airring and de-clotting the circuit, a power injector arch aortogram was completed at 40 degree LAO.    The catheter was then pulled into the descending thoracic aorta.  The catheter was exchanged for a H1 catheter over the Bentson wire.  Using this combination, I selected the innominate artery.  The wire continued to select the right common carotid artery, so I did a 30 degree RAO, 15 degree Caudal injection to splay the right subclavian artery and right common carotid artery bifurcation.  I selected  the right subclavian artery with a glidewire.  I advanced the H1 catheter into the distal subclavian artery.  The wire  was removed and then the catheter was connected to the power injector.  The proximal right arm angiogram was completed.  The findings are listed above.  To definitely interrogate for hemodynamically significant steal, I elected to place the catheter into the distal brachial artery, proximal to the anastomosis of the fistula.  I place the Versacore wire through the catheter and exchanged the catheter for a long Navicross catheter.  I removed the wire and connected the catheter to the power injector circuit.  The injection demonstrated NO blood flow distally into the radial and ulnar arteries with preferential shunting of blood in the fistula.  This injection was repeated with compression of the fistula.  This demonstrated no obvious disease in the ulnar or radial arteries distal to the anastomosis.  A distal injection of the wrist and hand was completed.  The findings are listed above.  Based on the images, I doubt any peripheral arterial disease is causing her mild steal syndrome symptoms.  I removed this Navicross catheter.  The sheath was aspirated.  No clots were present and the sheath was reloaded with heparinized saline.     Based on the relatively limited symptoms this patient is having, only during hemodialysis, I recommended trying a banding procedure of the distal anastomosis prior to consider any other interventions.    COMPLICATIONS: none  CONDITION: stable   Leonides Sake, MD, [facility] [place] Office: [phone] Pager: [phone]  09/08/2016, 1:54 PM

## 2016-09-08 NOTE — Interval H&P Note (Signed)
History and Physical Interval Note:  09/08/2016 9:28 AM  Renella CunasKatherine C Munter  has presented today for surgery, with the diagnosis of steal syndrome cpt code [id number]  The various methods of treatment have been discussed with the patient and family. After consideration of risks, benefits and other options for treatment, the patient has consented to  Procedure(s): Upper Extremity Angiography - Right Arm (N/A) A/V Fistulagram - Right Arm (N/A) as a surgical intervention .  The patient's history has been reviewed, patient examined, no change in status, stable for surgery.  I have reviewed the patient's chart and labs.  Questions were answered to the patient's satisfaction.     Leonides SakeBrian Jeneva Schweizer

## 2016-09-08 NOTE — H&P (View-Only) (Signed)
Established Dialysis Access   History of Present Illness   Valerie Perez is a 56 y.o. (10/13/1960) female who presents for cc: right 4th-5th finger pain on HD.  The patient is left ight hand dominant.  Previous access procedures have been completed in the right arm: R BC AVF and R BC AVF superficialization with SBL x 3.  The denies any steal sx except sharp pain in right 4th and 5th fingers during HD.  This resolves nearly instantly after HD ends.  Past Medical History:  Diagnosis Date  . Anemia   . Arthritis    osteoarthritis  . Depression   . Diabetes mellitus without complication (HCC)    Type II  . Dyspnea    with exertion  . Fatty liver   . Heart murmur    just recently told she had a heart murmur, but had never heard this before  . High cholesterol   . Hypertension   . Hypothyroid   . Neuropathy   . Pneumonia   . PONV (postoperative nausea and vomiting)   . Renal disorder    dialysis on Tuesday/Thursday/Saturday  . Sleep apnea    uses cpap    Past Surgical History:  Procedure Laterality Date  . ABDOMINAL HYSTERECTOMY    . AV FISTULA PLACEMENT Right 10/03/2015   Procedure: CREATION-RIGHT BRACHIOCEPHALIC ARTERIOVENOUS (AV) FISTULA;  Surgeon: Fransisco Hertz, MD;  Location: [facility] OR;  Service: Vascular;  Laterality: Right;  . CARDIAC CATHETERIZATION N/A 03/14/2016   Procedure: Left Heart Cath and Coronary Angiography;  Surgeon: Yvonne Kendall, MD;  Location: [facility] INVASIVE CV LAB;  Service: Cardiovascular;  Laterality: N/A;  . CHOLECYSTECTOMY  2000  . COLONOSCOPY    . DEBRIDEMENT OF ABDOMINAL WALL ABSCESS  01/14/2016  . FISTULA SUPERFICIALIZATION Right 01/30/2016   Procedure: FISTULA SUPERFICIALIZATION RIGHT UPPER ARM;  Surgeon: Fransisco Hertz, MD;  Location: [facility] OR;  Service: Vascular;  Laterality: Right;  . KIDNEY TRANSPLANT  2000    Social History   Social History  . Marital status: Married    Spouse name: N/A  . Number of children: N/A  . Years of education: N/A     Occupational History  . Not on file.   Social History Main Topics  . Smoking status: Never Smoker  . Smokeless tobacco: Never Used  . Alcohol use No  . Drug use: No  . Sexual activity: Not on file   Other Topics Concern  . Not on file   Social History Narrative  . No narrative on file    Family History  Problem Relation Age of Onset  . Colon cancer Mother   . Thyroid cancer Father     Current Outpatient Prescriptions  Medication Sig Dispense Refill  . acetaminophen (TYLENOL) 500 MG tablet Take 500 mg by mouth every 6 (six) hours as needed for moderate pain.    Marland Kitchen allopurinol (ZYLOPRIM) 300 MG tablet Take 300 mg by mouth daily.    Marland Kitchen amLODipine (NORVASC) 5 MG tablet Take 10 mg by mouth daily.    Marland Kitchen aspirin EC 81 MG tablet Take 81 mg by mouth daily.    Marland Kitchen buPROPion (WELLBUTRIN XL) 150 MG 24 hr tablet Take 150 mg by mouth 2 (two) times daily.     . cloNIDine (CATAPRES) 0.2 MG tablet Take 1 tablet (0.2 mg total) by mouth 3 (three) times daily. 90 tablet 0  . COLCRYS 0.6 MG tablet Take 1 tablet BID prn    . DULoxetine (CYMBALTA)  60 MG capsule Take 60 mg by mouth at bedtime.    . fluticasone (FLONASE) 50 MCG/ACT nasal spray Place 1 spray into both nostrils every morning.    . gabapentin (NEURONTIN) 100 MG capsule Take 300 mg by mouth at bedtime.    . hydrALAZINE (APRESOLINE) 100 MG tablet Take 1 tablet (100 mg total) by mouth every 8 (eight) hours. 90 tablet 0  . isosorbide mononitrate (IMDUR) 30 MG 24 hr tablet Take 1 tablet (30 mg total) by mouth daily. 30 tablet 0  . levothyroxine (SYNTHROID, LEVOTHROID) 75 MCG tablet Take 1 tablet (75 mcg total) by mouth daily before breakfast. 30 tablet 0  . loratadine (CLARITIN) 10 MG tablet Take 10 mg by mouth every morning.    . montelukast (SINGULAIR) 10 MG tablet Take 10 mg by mouth at bedtime.    . Nutritional Supplements (FEEDING SUPPLEMENT, NEPRO CARB STEADY,) LIQD Take 237 mLs by mouth at bedtime. 30 Can 0  . polyethylene glycol  (MIRALAX / GLYCOLAX) packet Take 17 g by mouth daily.    . rosuvastatin (CRESTOR) 20 MG tablet Take 20 mg by mouth daily.    . SENSIPAR 30 MG tablet Take 1 tablet by mouth daily.    . sevelamer carbonate (RENVELA) 800 MG tablet Take 1,600 mg by mouth 3 (three) times daily with meals.      No current facility-administered medications for this visit.      Allergies  Allergen Reactions  . Tape Other (See Comments)    Tears skin  . Atorvastatin     Other reaction(s): Cramps (ALLERGY/intolerance) Pt experienced severe leg cramping.  . 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    REVIEW OF SYSTEMS (negative unless checked):   Cardiac:  []  Chest pain or chest pressure? []  Shortness of breath upon activity? []  Shortness of breath when lying flat? []  Irregular heart rhythm?  Vascular:  []  Pain in calf, thigh, or hip brought on by walking? []  Pain in feet at night that wakes you up from your sleep? []  Blood clot in your veins? []  Leg swelling?  Pulmonary:  []  Oxygen at home? []  Productive cough? []  Wheezing?  Neurologic:  []  Sudden weakness in arms or legs? []  Sudden numbness in arms or legs? []  Sudden onset of difficult speaking or slurred speech? []  Temporary loss of vision in one eye? []  Problems with dizziness?  Gastrointestinal:  []  Blood in stool? []  Vomited blood?  Genitourinary:  []  Burning when urinating? []  Blood in urine? []  end stage renal disease-HD: T/R/S   Psychiatric:  []  Major depression  Hematologic:  []  Bleeding problems? []  Problems with blood clotting?  Dermatologic:  []  Rashes or ulcers?  Constitutional:  []  Fever or chills?  Ear/Nose/Throat:  []  Change in hearing? []  Nose bleeds? []  Sore throat?  Musculoskeletal:  []  Back pain? []  Joint pain? []  Muscle pain?   Physical Examination   Vitals:   08/22/16 1308 08/22/16 1314  BP: (!) 176/81 (!) 181/83  Pulse: 67   Resp: 20   Temp: 98.1 F (36.7 C)    TempSrc: Oral   SpO2: 97%   Weight: 202 lb (91.6 kg)   Height: 5\' 5"  (1.651 m)    Body mass index is 33.61 kg/m.  General Alert, O x 3, WD, NAD  Pulmonary Sym exp, good B air movt, CTA B  Cardiac RRR, Nl S1, S2, no Murmurs, No rubs, No S3,S4  Vascular Vessel Right Left  Radial Not  palpable Not palpable  Brachial Palpable Palpable  Ulnar Not palpable Not palpable    Musculo- skeletal M/S 5/5 throughout  , Extremities without ischemic changes    Neurologic Pain and light touch intact in extremities , Motor exam as listed above     Non-invasive Vascular Imaging   right Arm Steal Duplex  (08/19/16):   Evidence of flow reversal in ulnar artery   Medical Decision Making   Valerie Perez is a 56 y.o. female who presents with ESRD requiring hemodialysis.    The steal study wasn't done properly but there is some evidence of flow reversal that might be consistent with steal.  I would get a R arm angiogram, possible intervention to look for inflow disease that can be treated endovascularly.  I will also complete a R arm fistulogram at the same time. I discussed with the patient the nature of angiographic procedures, especially the limited patencies of any endovascular intervention.   The patient is aware of that the risks of an angiographic procedure include but are not limited to: bleeding, infection, access site complications, renal failure, embolization, rupture of vessel, dissection, arteriovenous fistula, possible need for emergent surgical intervention, possible need for surgical procedures to treat the patient's pathology, anaphylactic reaction to contrast, and stroke and death.    The patient has agreed to proceed with the above procedure which will be scheduled 16 JUL 18.   Leonides Sake, MD, FACS [facility] [place] Office: [phone] Pager: [phone]

## 2016-09-09 ENCOUNTER — Encounter (HOSPITAL_COMMUNITY): Payer: Self-pay | Admitting: Vascular Surgery

## 2016-09-09 NOTE — Progress Notes (Signed)
Anesthesia Chart Review:  Pt is a same day work up.   Pt is a 56 year old female scheduled for banding of R arm brachiocephalic AV fistula on 09/10/2016 with Leonides SakeBrian Chen, MD  PMH includes:  HTN, DM, hyperlipidemia, fatty liver, hypothyroidism, ESRD on hemodialysis, anemia, post-op N/V. Never smoker. BMI 33. S/p AV fistula creation 10/03/15 with superficialization 01/30/16.   - Hospitalized 1/16-30/18 for VF/PEA cardiac arrest for 45 minutes (K > 7.5), acute hypoxemic hypercarbic respiratory failure (secondary to aspiration pneumonia and cardiac arrest), cardiogenic shock, R popliteal DVT, acute encephalopathy. Cardiac cath showed mild RCA stenosis. EF normal on echo.  Cause of arrest determined to be hyperkalemia. Pt is not followed by cardiology.   Medications include: Amlodipine, ASA 81 mg, clonidine, iron, hydralazine, Imdur, levothyroxine, rosuvastatin  Labs will be obtained DOS.   1 view CXR 03/16/16:  1. Cardiomegaly and probable mild edema. 2. Left retrocardiac opacity is identified, more prominent in the the interval.  EKG 03/12/16: NSR. LAFB. Minimal voltage criteria for LVH, may be normal variant  Cardiac cath 03/14/16:  1. Minimal coronary artery disease, with 20% proximal RCA stenosis. No significant disease involving the left coronary artery. 2. Upper normal left ventricular filling pressure.  Echo 03/11/16:  - Left ventricle: The cavity size was normal. Wall thickness was increased in a pattern of severe LVH. Systolic function was normal. The estimated ejection fraction was in the range of 60% to 65%. Wall motion was normal; there were no regional wall motion abnormalities. - Aortic valve: There was trivial regurgitation.  If labs acceptable DOS, I anticipate pt can proceed as scheduled.   Rica Mastngela Cecil Vandyke, FNP-BC [facility]iology Phone: [phone] 09/09/2016 4:18 PM

## 2016-09-10 ENCOUNTER — Encounter (HOSPITAL_COMMUNITY)
Admission: RE | Disposition: A | Discharge status: Home / Self Care | Payer: Self-pay | Source: Ambulatory Visit | Attending: Vascular Surgery

## 2016-09-10 ENCOUNTER — Ambulatory Visit (HOSPITAL_COMMUNITY): Payer: Commercial Managed Care - PPO | Admitting: Emergency Medicine

## 2016-09-10 ENCOUNTER — Ambulatory Visit (HOSPITAL_COMMUNITY)
Admission: RE | Admit: 2016-09-10 | Discharge: 2016-09-10 | Disposition: A | Discharge status: Home / Self Care | Payer: Commercial Managed Care - PPO | Source: Ambulatory Visit | Attending: Vascular Surgery | Admitting: Vascular Surgery

## 2016-09-10 ENCOUNTER — Encounter (HOSPITAL_COMMUNITY): Payer: Self-pay | Admitting: *Deleted

## 2016-09-10 ENCOUNTER — Telehealth: Payer: Self-pay | Admitting: Vascular Surgery

## 2016-09-10 DIAGNOSIS — Z940 Kidney transplant status: Secondary | ICD-10-CM | POA: Insufficient documentation

## 2016-09-10 DIAGNOSIS — R011 Cardiac murmur, unspecified: Secondary | ICD-10-CM | POA: Insufficient documentation

## 2016-09-10 DIAGNOSIS — E1122 Type 2 diabetes mellitus with diabetic chronic kidney disease: Secondary | ICD-10-CM | POA: Insufficient documentation

## 2016-09-10 DIAGNOSIS — Y832 Surgical operation with anastomosis, bypass or graft as the cause of abnormal reaction of the patient, or of later complication, without mention of misadventure at the time of the procedure: Secondary | ICD-10-CM | POA: Diagnosis not present

## 2016-09-10 DIAGNOSIS — M1990 Unspecified osteoarthritis, unspecified site: Secondary | ICD-10-CM | POA: Diagnosis not present

## 2016-09-10 DIAGNOSIS — Z7982 Long term (current) use of aspirin: Secondary | ICD-10-CM | POA: Insufficient documentation

## 2016-09-10 DIAGNOSIS — G4730 Sleep apnea, unspecified: Secondary | ICD-10-CM | POA: Insufficient documentation

## 2016-09-10 DIAGNOSIS — T82898A Other specified complication of vascular prosthetic devices, implants and grafts, initial encounter: Secondary | ICD-10-CM | POA: Insufficient documentation

## 2016-09-10 DIAGNOSIS — K760 Fatty (change of) liver, not elsewhere classified: Secondary | ICD-10-CM | POA: Diagnosis not present

## 2016-09-10 DIAGNOSIS — Z992 Dependence on renal dialysis: Secondary | ICD-10-CM | POA: Insufficient documentation

## 2016-09-10 DIAGNOSIS — N186 End stage renal disease: Secondary | ICD-10-CM | POA: Insufficient documentation

## 2016-09-10 DIAGNOSIS — Z6833 Body mass index (BMI) 33.0-33.9, adult: Secondary | ICD-10-CM | POA: Insufficient documentation

## 2016-09-10 DIAGNOSIS — E039 Hypothyroidism, unspecified: Secondary | ICD-10-CM | POA: Diagnosis not present

## 2016-09-10 DIAGNOSIS — Z7951 Long term (current) use of inhaled steroids: Secondary | ICD-10-CM | POA: Insufficient documentation

## 2016-09-10 DIAGNOSIS — F329 Major depressive disorder, single episode, unspecified: Secondary | ICD-10-CM | POA: Insufficient documentation

## 2016-09-10 DIAGNOSIS — I120 Hypertensive chronic kidney disease with stage 5 chronic kidney disease or end stage renal disease: Secondary | ICD-10-CM | POA: Diagnosis not present

## 2016-09-10 DIAGNOSIS — E669 Obesity, unspecified: Secondary | ICD-10-CM | POA: Diagnosis not present

## 2016-09-10 DIAGNOSIS — E1140 Type 2 diabetes mellitus with diabetic neuropathy, unspecified: Secondary | ICD-10-CM | POA: Diagnosis not present

## 2016-09-10 DIAGNOSIS — E7800 Pure hypercholesterolemia, unspecified: Secondary | ICD-10-CM | POA: Insufficient documentation

## 2016-09-10 DIAGNOSIS — T82510A Breakdown (mechanical) of surgically created arteriovenous fistula, initial encounter: Secondary | ICD-10-CM | POA: Diagnosis not present

## 2016-09-10 HISTORY — DX: Constipation, unspecified: K59.00

## 2016-09-10 HISTORY — PX: REVISON OF ARTERIOVENOUS FISTULA: SHX6074

## 2016-09-10 HISTORY — DX: Acute embolism and thrombosis of unspecified deep veins of unspecified lower extremity: I82.409

## 2016-09-10 LAB — POCT I-STAT 4, (NA,K, GLUC, HGB,HCT)
GLUCOSE: 91 mg/dL (ref 65–99)
HEMATOCRIT: 33 % — AB (ref 36.0–46.0)
Hemoglobin: 11.2 g/dL — ABNORMAL LOW (ref 12.0–15.0)
POTASSIUM: 4 mmol/L (ref 3.5–5.1)
Sodium: 138 mmol/L (ref 135–145)

## 2016-09-10 LAB — SURGICAL PCR SCREEN
MRSA, PCR: NEGATIVE
STAPHYLOCOCCUS AUREUS: NEGATIVE

## 2016-09-10 SURGERY — REVISON OF ARTERIOVENOUS FISTULA
Anesthesia: General | Site: Arm Upper | Laterality: Right

## 2016-09-10 MED ORDER — MIDAZOLAM HCL 5 MG/5ML IJ SOLN
INTRAMUSCULAR | Status: DC | PRN
  Administered 2016-09-10: 2 mg via INTRAVENOUS

## 2016-09-10 MED ORDER — MIDAZOLAM HCL 2 MG/2ML IJ SOLN
INTRAMUSCULAR | Status: AC
  Filled 2016-09-10: qty 2

## 2016-09-10 MED ORDER — FENTANYL CITRATE (PF) 100 MCG/2ML IJ SOLN
INTRAMUSCULAR | Status: AC
  Administered 2016-09-10: 50 ug via INTRAVENOUS
  Filled 2016-09-10: qty 2

## 2016-09-10 MED ORDER — DEXTROSE 5 % IV SOLN
1.5000 g | INTRAVENOUS | Status: AC
  Administered 2016-09-10: 1.5 g via INTRAVENOUS

## 2016-09-10 MED ORDER — OXYCODONE HCL 5 MG PO TABS: 5.0000 mg | ORAL_TABLET | Freq: Four times a day (QID) | ORAL | 0 refills | Status: DC | PRN

## 2016-09-10 MED ORDER — SODIUM CHLORIDE 0.9 % IV SOLN
INTRAVENOUS | Status: DC | PRN
  Administered 2016-09-10: 09:00:00 500 mL

## 2016-09-10 MED ORDER — ONDANSETRON HCL 4 MG/2ML IJ SOLN
INTRAMUSCULAR | Status: AC
  Filled 2016-09-10: qty 2

## 2016-09-10 MED ORDER — FENTANYL CITRATE (PF) 100 MCG/2ML IJ SOLN
INTRAMUSCULAR | Status: DC | PRN
  Administered 2016-09-10: 50 ug via INTRAVENOUS
  Administered 2016-09-10 (×2): 25 ug via INTRAVENOUS

## 2016-09-10 MED ORDER — PROPOFOL 1000 MG/100ML IV EMUL
INTRAVENOUS | Status: AC
  Filled 2016-09-10: qty 100

## 2016-09-10 MED ORDER — ISOSORBIDE MONONITRATE ER 30 MG PO TB24: 60.0000 mg | ORAL_TABLET | Freq: Two times a day (BID) | ORAL | Status: DC

## 2016-09-10 MED ORDER — NEPRO/CARBSTEADY PO LIQD: 237.0000 mL | ORAL | Status: DC

## 2016-09-10 MED ORDER — DEXTROSE 5 % IV SOLN
INTRAVENOUS | Status: AC
  Filled 2016-09-10: qty 1.5

## 2016-09-10 MED ORDER — ARTIFICIAL TEARS OPHTHALMIC OINT
TOPICAL_OINTMENT | OPHTHALMIC | Status: AC
  Filled 2016-09-10: qty 3.5

## 2016-09-10 MED ORDER — CHLORHEXIDINE GLUCONATE CLOTH 2 % EX PADS: 6.0000 | MEDICATED_PAD | Freq: Once | CUTANEOUS | Status: DC

## 2016-09-10 MED ORDER — FENTANYL CITRATE (PF) 250 MCG/5ML IJ SOLN
INTRAMUSCULAR | Status: AC
  Filled 2016-09-10: qty 5

## 2016-09-10 MED ORDER — SODIUM CHLORIDE 0.9 % IV SOLN
INTRAVENOUS | Status: DC
  Administered 2016-09-10: 08:00:00 via INTRAVENOUS

## 2016-09-10 MED ORDER — ONDANSETRON HCL 4 MG/2ML IJ SOLN
INTRAMUSCULAR | Status: DC | PRN
Start: 2016-09-10 — End: 2016-09-10
  Administered 2016-09-10: 4 mg via INTRAVENOUS

## 2016-09-10 MED ORDER — LIDOCAINE HCL (CARDIAC) 20 MG/ML IV SOLN
INTRAVENOUS | Status: AC
  Filled 2016-09-10: qty 5

## 2016-09-10 MED ORDER — MUPIROCIN 2 % EX OINT
TOPICAL_OINTMENT | CUTANEOUS | Status: AC
  Filled 2016-09-10: qty 22

## 2016-09-10 MED ORDER — PROPOFOL 10 MG/ML IV BOLUS
INTRAVENOUS | Status: DC | PRN
  Administered 2016-09-10: 180 mg via INTRAVENOUS

## 2016-09-10 MED ORDER — LIDOCAINE 2% (20 MG/ML) 5 ML SYRINGE
INTRAMUSCULAR | Status: DC | PRN
  Administered 2016-09-10: 60 mg via INTRAVENOUS

## 2016-09-10 MED ORDER — PHENYLEPHRINE HCL 10 MG/ML IJ SOLN
INTRAVENOUS | Status: DC | PRN
  Administered 2016-09-10: 25 ug/min via INTRAVENOUS

## 2016-09-10 MED ORDER — FENTANYL CITRATE (PF) 100 MCG/2ML IJ SOLN
25.0000 ug | INTRAMUSCULAR | Status: DC | PRN
  Administered 2016-09-10 (×3): 50 ug via INTRAVENOUS

## 2016-09-10 MED ORDER — PROPOFOL 10 MG/ML IV BOLUS
INTRAVENOUS | Status: AC
  Filled 2016-09-10: qty 20

## 2016-09-10 MED ORDER — MUPIROCIN 2 % EX OINT
1.0000 "application " | TOPICAL_OINTMENT | Freq: Once | CUTANEOUS | Status: AC
  Administered 2016-09-10: 1 via TOPICAL

## 2016-09-10 MED ORDER — LIDOCAINE HCL (PF) 1 % IJ SOLN
INTRAMUSCULAR | Status: AC
  Filled 2016-09-10: qty 30

## 2016-09-10 MED ORDER — ONDANSETRON HCL 4 MG/2ML IJ SOLN: 4.0000 mg | Freq: Once | INTRAMUSCULAR | Status: DC | PRN

## 2016-09-10 MED ORDER — 0.9 % SODIUM CHLORIDE (POUR BTL) OPTIME
TOPICAL | Status: DC | PRN
  Administered 2016-09-10: 1000 mL

## 2016-09-10 SURGICAL SUPPLY — 35 items
ARMBAND PINK RESTRICT EXTREMIT (MISCELLANEOUS) ×3 IMPLANT
CANISTER SUCT 3000ML PPV (MISCELLANEOUS) ×3 IMPLANT
CLIP VESOCCLUDE MED 6/CT (CLIP) ×3 IMPLANT
CLIP VESOCCLUDE SM WIDE 6/CT (CLIP) ×3 IMPLANT
COVER PROBE W GEL 5X96 (DRAPES) IMPLANT
DECANTER SPIKE VIAL GLASS SM (MISCELLANEOUS) ×3 IMPLANT
DERMABOND ADVANCED (GAUZE/BANDAGES/DRESSINGS) ×2
DERMABOND ADVANCED .7 DNX12 (GAUZE/BANDAGES/DRESSINGS) ×1 IMPLANT
ELECT REM PT RETURN 9FT ADLT (ELECTROSURGICAL) ×3
ELECTRODE REM PT RTRN 9FT ADLT (ELECTROSURGICAL) ×1 IMPLANT
GLOVE BIO SURGEON STRL SZ 6.5 (GLOVE) ×6 IMPLANT
GLOVE BIO SURGEON STRL SZ7 (GLOVE) ×3 IMPLANT
GLOVE BIO SURGEONS STRL SZ 6.5 (GLOVE) ×3
GLOVE BIOGEL PI IND STRL 6.5 (GLOVE) ×3 IMPLANT
GLOVE BIOGEL PI IND STRL 7.5 (GLOVE) ×1 IMPLANT
GLOVE BIOGEL PI INDICATOR 6.5 (GLOVE) ×6
GLOVE BIOGEL PI INDICATOR 7.5 (GLOVE) ×2
GOWN STRL REUS W/ TWL LRG LVL3 (GOWN DISPOSABLE) ×3 IMPLANT
GOWN STRL REUS W/TWL LRG LVL3 (GOWN DISPOSABLE) ×9
GRAFT GORETEX 6X10 (Vascular Products) ×3 IMPLANT
HEMOSTAT SPONGE AVITENE ULTRA (HEMOSTASIS) IMPLANT
KIT BASIN OR (CUSTOM PROCEDURE TRAY) ×3 IMPLANT
KIT ROOM TURNOVER OR (KITS) ×3 IMPLANT
NS IRRIG 1000ML POUR BTL (IV SOLUTION) ×3 IMPLANT
PACK CV ACCESS (CUSTOM PROCEDURE TRAY) ×3 IMPLANT
PAD ARMBOARD 7.5X6 YLW CONV (MISCELLANEOUS) ×6 IMPLANT
STAPLER VISISTAT 35W (STAPLE) IMPLANT
SUT MNCRL AB 4-0 PS2 18 (SUTURE) ×3 IMPLANT
SUT PROLENE 5 0 C 1 24 (SUTURE) ×9 IMPLANT
SUT PROLENE 6 0 BV (SUTURE) IMPLANT
SUT PROLENE 7 0 BV 1 (SUTURE) ×3 IMPLANT
SUT VIC AB 3-0 SH 27 (SUTURE) ×3
SUT VIC AB 3-0 SH 27X BRD (SUTURE) ×1 IMPLANT
UNDERPAD 30X30 (UNDERPADS AND DIAPERS) ×3 IMPLANT
WATER STERILE IRR 1000ML POUR (IV SOLUTION) ×3 IMPLANT

## 2016-09-10 NOTE — Anesthesia Postprocedure Evaluation (Signed)
Anesthesia Post Note  Patient: Valerie Perez  Procedure(s) Performed: Procedure(s) (LRB): BANDING OF RIGHT ARM BRACHIOCEPHALIC ARTERIOVENOUS FISTULA (Right)     Patient location during evaluation: PACU Anesthesia Type: General Level of consciousness: awake, awake and alert and oriented Pain management: pain level controlled Vital Signs Assessment: post-procedure vital signs reviewed and stable Respiratory status: spontaneous breathing, nonlabored ventilation and respiratory function stable Cardiovascular status: blood pressure returned to baseline Anesthetic complications: no    Last Vitals:  Vitals:   09/10/16 1045 09/10/16 1053  BP: (!) 118/57 (!) 117/58  Pulse: (!) 58 (!) 58  Resp: 17 18  Temp:  36.6 C    Last Pain:  Vitals:   09/10/16 1106  TempSrc:   PainSc: 0-No pain                 Merland Holness COKER

## 2016-09-10 NOTE — Transfer of Care (Signed)
Immediate Anesthesia Transfer of Care Note  Patient: Renella CunasKatherine C Maddy  Procedure(s) Performed: Procedure(s): BANDING OF RIGHT ARM BRACHIOCEPHALIC ARTERIOVENOUS FISTULA (Right)  Patient Location: PACU  Anesthesia Type:General  Level of Consciousness: awake, alert , oriented and patient cooperative  Airway & Oxygen Therapy: Patient Spontanous Breathing and Patient connected to nasal cannula oxygen  Post-op Assessment: Report given to RN, Post -op Vital signs reviewed and stable and Patient moving all extremities X 4  Post vital signs: Reviewed and stable  Last Vitals:  Vitals:   09/10/16 0957 09/10/16 1000  BP:  (!) 125/93  Pulse: 64 65  Resp:  15  Temp: 36.7 C     Last Pain:  Vitals:   09/10/16 0650  TempSrc: Oral      Patients Stated Pain Goal: 8 (09/10/16 0655)  Complications: No apparent anesthesia complications

## 2016-09-10 NOTE — Op Note (Signed)
OPERATIVE NOTE   PROCEDURE: 1. Banding of right brachiocephalic arteriovenous fistula (revision of right brachiocephalic arteriovenous fistula)  PRE-OPERATIVE DIAGNOSIS: steal syndrome right hand  POST-OPERATIVE DIAGNOSIS: same as above   SURGEON: Leonides SakeBrian Ziyan Hillmer, MD  ASSISTANT(S): Doreatha MassedSamantha Rhyne, PAC   ANESTHESIA: general  ESTIMATED BLOOD LOSS: 30 cc  FINDING(S): 1.  Distal brachiocephalic arteriovenous fistula measures ~15 mm in diameter 2.  Occlusion of fistula with external banding of distal brachiocephalic arteriovenous fistula: immediately reversed 3.  Preservation of thrill after plication of distal brachiocephalic arteriovenous fistula  4.  Improvement of radial signal from dampened monophasic to barely biphasic 5.  Improvement of ulnar signal from barely audible to strong monophasic  SPECIMEN(S):  none  INDICATIONS:   Valerie Perez is a 56 y.o. female who presents with steal syndrome.  Recent right arm angiogram and fistulogram demonstrated hemodynamically significant steal without obvious peripheral arterial disease in right arm.  I recommended banding right brachiocephalic arteriovenous fistula given the patient only has symptoms on hemodialysis.  Risk, benefits, and alternatives to access surgery were discussed.  The patient is aware the risks include but are not limited to: bleeding, infection, steal syndrome, nerve damage, ischemic monomelic neuropathy, thrombosis, failure to mature, complications related to venous hypertension, need for additional procedures, death and stroke.  The patient agrees to proceed forward with the procedure.  DESCRIPTION: After obtaining full informed written consent, the patient was brought back to the operating room and placed supine upon the operating table.  The patient received IV antibiotics prior to induction.  A procedure time out was completed and the correct surgical site was verified.  After obtaining adequate anesthesia, the  patient was prepped and draped in the standard fashion for: right arm access procedure.  First, I marked out the radial and ulnar arteries with a continuous doppler.  The waveform in the radial artery was a dampened monophasic.  In the ulnar artery, I could barely get a signal.  This was consistent with the finding on the right arm angiogram.  Next, I made a transverse incision through the prior incision and dissected out the distal brachiocephalic arteriovenous fistula which was aneurysmal.  The anatomic location of the aneurysmal segment was as predicted by the fistulogram; however, the aneurysmal diameter appeared to be even greater than predicted by the fistulogram, measuring roughly 15 mm in diameter.    Initially, I obtained a 6 mm segment of PTFE and transected a 3 cm length and opened this up longitudinally.  I briefly clamped the fistula just distal to the anastomosis and then sewed the PTFE cuff around the distal fistula with interrupted 5-0 Prolene stitches.  I released the clamp and there was no thrill or pulse in the fistula distally.  I suspected that with the redundant fistula wall, effectively the PTFE cuff was causing occlusion of the fistula.  I sharply removed the stitches and removed the PTFE cuff.  I then obtain a Colley clamp and clamped it on the distal fistula longitudinally, preserving a 6-8 mm lumen in the distal segment.  Distally the ulnar and radial signal augmented appropriately.  I then oversewed the medial wall of this fistula with a double layer of 5-0 Prolene.  I removed the clamp and the excluded portion of the wall remained collapsed.  Distally, the radial signal augmented to barely biphasic and the ulnar signal augmented to strong monophasic.  I washed out the surgical incision.  There was no active bleeding.  I repaired the subcutaneous tissue  with a running stitch of 3-0 Vicryl.  The skin was reapproximated with a running subcuticular stitch of 4-0 Monocryl.  The skin  was cleaned, dried, and reinforced with Dermabond.   COMPLICATIONS: none  CONDITION: stable   Leonides Sake, MD, [facility] [place] Office: [phone] Pager: [phone]  09/10/2016, 9:43 AM

## 2016-09-10 NOTE — Anesthesia Preprocedure Evaluation (Addendum)
Anesthesia Evaluation  Patient identified by MRN, date of birth, ID band Patient awake    Reviewed: Allergy & Precautions, NPO status , Patient's Chart, lab work & pertinent test results  Airway Mallampati: II  TM Distance: >3 FB Neck ROM: Full    Dental  (+) Teeth Intact, Dental Advisory Given   Pulmonary    breath sounds clear to auscultation       Cardiovascular hypertension,  Rhythm:Regular Rate:Normal     Neuro/Psych    GI/Hepatic   Endo/Other  diabetes  Renal/GU      Musculoskeletal   Abdominal (+) + obese,   Peds  Hematology   Anesthesia Other Findings   Reproductive/Obstetrics                            Anesthesia Physical Anesthesia Plan  ASA: III  Anesthesia Plan: General   Post-op Pain Management:    Induction: Intravenous  PONV Risk Score and Plan: Ondansetron, Propofol and Midazolam  Airway Management Planned: LMA  Additional Equipment:   Intra-op Plan:   Post-operative Plan:   Informed Consent: I have reviewed the patients History and Physical, chart, labs and discussed the procedure including the risks, benefits and alternatives for the proposed anesthesia with the patient or authorized representative who has indicated his/her understanding and acceptance.   Dental advisory given  Plan Discussed with: CRNA and Anesthesiologist  Anesthesia Plan Comments:        Anesthesia Quick Evaluation

## 2016-09-10 NOTE — Telephone Encounter (Signed)
Sched appt 10/15/16 at 8:45. Lm on hm#.

## 2016-09-10 NOTE — H&P (View-Only) (Signed)
Established Dialysis Access   History of Present Illness   Valerie Perez is a 56 y.o. (10/13/1960) female who presents for cc: right 4th-5th finger pain on HD.  The patient is left ight hand dominant.  Previous access procedures have been completed in the right arm: R BC AVF and R BC AVF superficialization with SBL x 3.  The denies any steal sx except sharp pain in right 4th and 5th fingers during HD.  This resolves nearly instantly after HD ends.  Past Medical History:  Diagnosis Date  . Anemia   . Arthritis    osteoarthritis  . Depression   . Diabetes mellitus without complication (HCC)    Type II  . Dyspnea    with exertion  . Fatty liver   . Heart murmur    just recently told she had a heart murmur, but had never heard this before  . High cholesterol   . Hypertension   . Hypothyroid   . Neuropathy   . Pneumonia   . PONV (postoperative nausea and vomiting)   . Renal disorder    dialysis on Tuesday/Thursday/Saturday  . Sleep apnea    uses cpap    Past Surgical History:  Procedure Laterality Date  . ABDOMINAL HYSTERECTOMY    . AV FISTULA PLACEMENT Right 10/03/2015   Procedure: CREATION-RIGHT BRACHIOCEPHALIC ARTERIOVENOUS (AV) FISTULA;  Surgeon: Fransisco Hertz, MD;  Location: [facility] OR;  Service: Vascular;  Laterality: Right;  . CARDIAC CATHETERIZATION N/A 03/14/2016   Procedure: Left Heart Cath and Coronary Angiography;  Surgeon: Yvonne Kendall, MD;  Location: [facility] INVASIVE CV LAB;  Service: Cardiovascular;  Laterality: N/A;  . CHOLECYSTECTOMY  2000  . COLONOSCOPY    . DEBRIDEMENT OF ABDOMINAL WALL ABSCESS  01/14/2016  . FISTULA SUPERFICIALIZATION Right 01/30/2016   Procedure: FISTULA SUPERFICIALIZATION RIGHT UPPER ARM;  Surgeon: Fransisco Hertz, MD;  Location: [facility] OR;  Service: Vascular;  Laterality: Right;  . KIDNEY TRANSPLANT  2000    Social History   Social History  . Marital status: Married    Spouse name: N/A  . Number of children: N/A  . Years of education: N/A     Occupational History  . Not on file.   Social History Main Topics  . Smoking status: Never Smoker  . Smokeless tobacco: Never Used  . Alcohol use No  . Drug use: No  . Sexual activity: Not on file   Other Topics Concern  . Not on file   Social History Narrative  . No narrative on file    Family History  Problem Relation Age of Onset  . Colon cancer Mother   . Thyroid cancer Father     Current Outpatient Prescriptions  Medication Sig Dispense Refill  . acetaminophen (TYLENOL) 500 MG tablet Take 500 mg by mouth every 6 (six) hours as needed for moderate pain.    Marland Kitchen allopurinol (ZYLOPRIM) 300 MG tablet Take 300 mg by mouth daily.    Marland Kitchen amLODipine (NORVASC) 5 MG tablet Take 10 mg by mouth daily.    Marland Kitchen aspirin EC 81 MG tablet Take 81 mg by mouth daily.    Marland Kitchen buPROPion (WELLBUTRIN XL) 150 MG 24 hr tablet Take 150 mg by mouth 2 (two) times daily.     . cloNIDine (CATAPRES) 0.2 MG tablet Take 1 tablet (0.2 mg total) by mouth 3 (three) times daily. 90 tablet 0  . COLCRYS 0.6 MG tablet Take 1 tablet BID prn    . DULoxetine (CYMBALTA)  60 MG capsule Take 60 mg by mouth at bedtime.    . fluticasone (FLONASE) 50 MCG/ACT nasal spray Place 1 spray into both nostrils every morning.    . gabapentin (NEURONTIN) 100 MG capsule Take 300 mg by mouth at bedtime.    . hydrALAZINE (APRESOLINE) 100 MG tablet Take 1 tablet (100 mg total) by mouth every 8 (eight) hours. 90 tablet 0  . isosorbide mononitrate (IMDUR) 30 MG 24 hr tablet Take 1 tablet (30 mg total) by mouth daily. 30 tablet 0  . levothyroxine (SYNTHROID, LEVOTHROID) 75 MCG tablet Take 1 tablet (75 mcg total) by mouth daily before breakfast. 30 tablet 0  . loratadine (CLARITIN) 10 MG tablet Take 10 mg by mouth every morning.    . montelukast (SINGULAIR) 10 MG tablet Take 10 mg by mouth at bedtime.    . Nutritional Supplements (FEEDING SUPPLEMENT, NEPRO CARB STEADY,) LIQD Take 237 mLs by mouth at bedtime. 30 Can 0  . polyethylene glycol  (MIRALAX / GLYCOLAX) packet Take 17 g by mouth daily.    . rosuvastatin (CRESTOR) 20 MG tablet Take 20 mg by mouth daily.    . SENSIPAR 30 MG tablet Take 1 tablet by mouth daily.    . sevelamer carbonate (RENVELA) 800 MG tablet Take 1,600 mg by mouth 3 (three) times daily with meals.      No current facility-administered medications for this visit.      Allergies  Allergen Reactions  . Tape Other (See Comments)    Tears skin  . Atorvastatin     Other reaction(s): Cramps (ALLERGY/intolerance) Pt experienced severe leg cramping.  . 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    REVIEW OF SYSTEMS (negative unless checked):   Cardiac:  []  Chest pain or chest pressure? []  Shortness of breath upon activity? []  Shortness of breath when lying flat? []  Irregular heart rhythm?  Vascular:  []  Pain in calf, thigh, or hip brought on by walking? []  Pain in feet at night that wakes you up from your sleep? []  Blood clot in your veins? []  Leg swelling?  Pulmonary:  []  Oxygen at home? []  Productive cough? []  Wheezing?  Neurologic:  []  Sudden weakness in arms or legs? []  Sudden numbness in arms or legs? []  Sudden onset of difficult speaking or slurred speech? []  Temporary loss of vision in one eye? []  Problems with dizziness?  Gastrointestinal:  []  Blood in stool? []  Vomited blood?  Genitourinary:  []  Burning when urinating? []  Blood in urine? []  end stage renal disease-HD: T/R/S   Psychiatric:  []  Major depression  Hematologic:  []  Bleeding problems? []  Problems with blood clotting?  Dermatologic:  []  Rashes or ulcers?  Constitutional:  []  Fever or chills?  Ear/Nose/Throat:  []  Change in hearing? []  Nose bleeds? []  Sore throat?  Musculoskeletal:  []  Back pain? []  Joint pain? []  Muscle pain?   Physical Examination   Vitals:   08/22/16 1308 08/22/16 1314  BP: (!) 176/81 (!) 181/83  Pulse: 67   Resp: 20   Temp: 98.1 F (36.7 C)    TempSrc: Oral   SpO2: 97%   Weight: 202 lb (91.6 kg)   Height: 5\' 5"  (1.651 m)    Body mass index is 33.61 kg/m.  General Alert, O x 3, WD, NAD  Pulmonary Sym exp, good B air movt, CTA B  Cardiac RRR, Nl S1, S2, no Murmurs, No rubs, No S3,S4  Vascular Vessel Right Left  Radial Not  palpable Not palpable  Brachial Palpable Palpable  Ulnar Not palpable Not palpable    Musculo- skeletal M/S 5/5 throughout  , Extremities without ischemic changes    Neurologic Pain and light touch intact in extremities , Motor exam as listed above     Non-invasive Vascular Imaging   right Arm Steal Duplex  (08/19/16):   Evidence of flow reversal in ulnar artery   Medical Decision Making   Valerie Perez is a 56 y.o. female who presents with ESRD requiring hemodialysis.    The steal study wasn't done properly but there is some evidence of flow reversal that might be consistent with steal.  I would get a R arm angiogram, possible intervention to look for inflow disease that can be treated endovascularly.  I will also complete a R arm fistulogram at the same time. I discussed with the patient the nature of angiographic procedures, especially the limited patencies of any endovascular intervention.   The patient is aware of that the risks of an angiographic procedure include but are not limited to: bleeding, infection, access site complications, renal failure, embolization, rupture of vessel, dissection, arteriovenous fistula, possible need for emergent surgical intervention, possible need for surgical procedures to treat the patient's pathology, anaphylactic reaction to contrast, and stroke and death.    The patient has agreed to proceed with the above procedure which will be scheduled 16 JUL 18.   Leonides Sake, MD, FACS [facility] [place] Office: [phone] Pager: [phone]

## 2016-09-10 NOTE — Interval H&P Note (Signed)
   History and Physical Update  The patient was interviewed and re-examined.  The patient's previous History and Physical has been reviewed and is unchanged from my consult except for: interval fistulogram done via right femoral cannulation that demonstrates hemodynamically significant steal.  The plan is banding of distal right brachiocephalic arteriovenous fistula.   Risk, benefits, and alternatives to access surgery were discussed.    The patient is aware the risks include but are not limited to: bleeding, infection, steal syndrome, nerve damage, ischemic monomelic neuropathy, thrombosis, failure to mature, complications related to venous hypertension, need for additional procedures, death and stroke.    The patient agrees to proceed forward with the procedure.  Leonides SakeBrian Chen, MD, FACS [facility] [place] Office: [phone] Pager: [phone]  09/10/2016, 7:11 AM

## 2016-09-10 NOTE — Telephone Encounter (Signed)
-----   Message from Sharee PimpleMarilyn K McChesney, RN sent at 09/10/2016 10:05 AM EDT ----- Regarding: 2-3 weeks   ----- Message ----- From: Dara Lordshyne, Samantha J, PA-C Sent: 09/10/2016  10:01 AM To: Vvs Charge Pool  S/p banding of right BC AVF 09/10/16.  F/u with Dr. Imogene Burnhen in 2-3 weeks.  Thanks!

## 2016-09-10 NOTE — Discharge Instructions (Signed)
[facility]  Discharge Instructions  AV Fistula or Graft Surgery for Dialysis Access  Please refer to the following instructions for your post-procedure care. Your surgeon or physician assistant will discuss any changes with you.  Activity  You may drive the day following your surgery, if you are comfortable and no longer taking prescription pain medication. Resume full activity as the soreness in your incision resolves.  Bathing/Showering  You may shower after you go home. Keep your incision dry for 48 hours. Do not soak in a bathtub, hot tub, or swim until the incision heals completely. You may not shower if you have a hemodialysis catheter.  Incision Care  Clean your incision with mild soap and water after 48 hours. Pat the area dry with a clean towel. You do not need a bandage unless otherwise instructed. Do not apply any ointments or creams to your incision. You may have skin glue on your incision. Do not peel it off. It will come off on its own in about one week. Your arm may swell a bit after surgery. To reduce swelling use pillows to elevate your arm so it is above your heart. Your doctor will tell you if you need to lightly wrap your arm with an ACE bandage.  Diet  Resume your normal diet. There are not special food restrictions following this procedure. In order to heal from your surgery, it is CRITICAL to get adequate nutrition. Your body requires vitamins, minerals, and protein. Vegetables are the best source of vitamins and minerals. Vegetables also provide the perfect balance of protein. Processed food has little nutritional value, so try to avoid this.  Medications  Resume taking all of your medications. If your incision is causing pain, you may take over-the counter pain relievers such as acetaminophen (Tylenol). If you were prescribed a stronger pain medication, please be aware these medications can cause nausea and constipation. Prevent  nausea by taking the medication with a snack or meal. Avoid constipation by drinking plenty of fluids and eating foods with high amount of fiber, such as fruits, vegetables, and grains. Do not take Tylenol if you are taking prescription pain medications.     Follow up Your surgeon may want to see you in the office following your access surgery. If so, this will be arranged at the time of your surgery.  Please call us immediately for any of the following conditions:  Increased pain, redness, drainage (pus) from your incision site Fever of 101 degrees or higher Severe or worsening pain at your incision site Hand pain or numbness.  Reduce your risk of vascular disease:  Stop smoking. If you would like help, call [facility] at 1-800-QUIT-NOW (484-175-55951-(610)414-2028) or [place] at [phone]  Manage your cholesterol Maintain a desired weight Control your diabetes Keep your blood pressure down  Dialysis  It will take several weeks to several months for your new dialysis access to be ready for use. Your surgeon will determine when it is OK to use it. Your nephrologist will continue to direct your dialysis. You can continue to use your Permcath until your new access is ready for use.   09/10/2016 Renella CunasKatherine C Recht [id number] 09/29/1960  Surgeon(s): Fransisco Hertzhen, Brian L, MD  Procedure(s): BANDING OF RIGHT ARM BRACHIOCEPHALIC ARTERIOVENOUS FISTULA   x May stick graft on designated area only:  Do NOT stick over new incision for 8 weeks. May stick above incision immediately.   If you have any questions, please call the office  at [phone].

## 2016-09-10 NOTE — Anesthesia Procedure Notes (Signed)
Procedure Name: LMA Insertion Date/Time: 09/10/2016 8:39 AM Performed by: Annabelle HarmanSMITH, Tacey Dimaggio A Pre-anesthesia Checklist: Patient identified, Emergency Drugs available, Suction available and Patient being monitored Patient Re-evaluated:Patient Re-evaluated prior to induction Oxygen Delivery Method: Circle system utilized Preoxygenation: Pre-oxygenation with 100% oxygen Induction Type: IV induction Ventilation: Mask ventilation without difficulty LMA: LMA flexible inserted LMA Size: 4.0 Number of attempts: 1 Placement Confirmation: positive ETCO2 and breath sounds checked- equal and bilateral Tube secured with: Tape Dental Injury: Teeth and Oropharynx as per pre-operative assessment

## 2016-09-11 ENCOUNTER — Encounter (HOSPITAL_COMMUNITY): Payer: Self-pay | Admitting: Vascular Surgery

## 2016-10-02 NOTE — Progress Notes (Signed)
    Postoperative Access Visit   History of Present Illness   Valerie Perez is a 56 y.o. year old female who presents for postoperative follow-up for: banding via plication of right brachiocephalic arteriovenous fistula (Date: 09/10/16).  The patient's wounds are healed.  The patient notes improvement in steal symptoms.  The patient is able to complete their activities of daily living.  The patient's current symptoms are: none.  The patient has found that cushioning her L elbow has helped eliminate the prior steal sx during HD.   Physical Examination   Vitals:   10/15/16 0841  BP: (!) 157/73  Pulse: 68  Resp: 18  Temp: 97.8 F (36.6 C)  SpO2: 96%  Weight: 198 lb (89.8 kg)  Height: 5' 5.5" (1.664 m)    RUE: Incision is healed, skin feels warm, hand grip is 5/5, sensation in digits is intact, palpable thrill, bruit can be auscultated, radial pulse is faintly palpable   Medical Decision Making   Valerie Perez is a 56 y.o. year old female who presents s/p banding via plication right brachiocephalic arteriovenous fistula   Unclear to me if this patient has some element of ulnar nerve compression from cubital tunnel syndrome.  If her sx worsen, would refer her to Hand Surgery for evaluation.  Regardless, pt now has a palpable radial pulse, so I doubt she will develop any digital ischemia from any steal syndrome.  Follow as needed.  Thank you for allowing us to participate in this patient's care.   Leonides SakeBrian Draven Laine, MD, FACS [facility] Office: [phone] Pager: [phone]

## 2016-10-07 ENCOUNTER — Encounter: Payer: Self-pay | Admitting: Vascular Surgery

## 2016-10-15 ENCOUNTER — Ambulatory Visit (INDEPENDENT_AMBULATORY_CARE_PROVIDER_SITE_OTHER): Payer: Self-pay | Admitting: Vascular Surgery

## 2016-10-15 ENCOUNTER — Encounter: Payer: Self-pay | Admitting: Vascular Surgery

## 2016-10-15 VITALS — BP 157/73 | HR 68 | Temp 97.8°F | Resp 18 | Ht 65.5 in | Wt 198.0 lb

## 2016-10-15 DIAGNOSIS — Z992 Dependence on renal dialysis: Secondary | ICD-10-CM

## 2016-10-15 DIAGNOSIS — N186 End stage renal disease: Secondary | ICD-10-CM

## 2017-01-02 ENCOUNTER — Ambulatory Visit: Payer: Commercial Managed Care - PPO | Admitting: Internal Medicine

## 2017-01-09 ENCOUNTER — Encounter: Payer: Self-pay | Admitting: *Deleted

## 2017-01-12 ENCOUNTER — Encounter: Payer: Self-pay | Admitting: Neurology

## 2017-01-12 ENCOUNTER — Other Ambulatory Visit: Payer: Self-pay

## 2017-01-12 ENCOUNTER — Ambulatory Visit (INDEPENDENT_AMBULATORY_CARE_PROVIDER_SITE_OTHER): Payer: Commercial Managed Care - PPO | Admitting: Neurology

## 2017-01-12 VITALS — BP 140/76 | HR 80 | Ht 65.0 in | Wt 198.0 lb

## 2017-01-12 DIAGNOSIS — G253 Myoclonus: Secondary | ICD-10-CM | POA: Diagnosis not present

## 2017-01-12 NOTE — Patient Instructions (Signed)
   Call our office if there is any change in your condition.

## 2017-01-12 NOTE — Progress Notes (Signed)
Reason for visit: Myoclonus  Referring physician: Dr. Isabelle CourseUppin  Valerie Perez is a 56 y.o. female  History of present illness:  Valerie Perez is a 56 year old left-handed white female with a history of diabetes with a diabetic peripheral neuropathy and end-stage renal disease on hemodialysis.  The patient does have some burning sensations in the feet and numbness in the toes associated with her neuropathy.  Valerie Perez was on gabapentin taking 100 mg 3 times daily, but over the last 6 months Valerie Perez has developed some issues with myoclonus affecting the arms and the legs.  The episodes of twitching of the muscles may affect her ability to use a mouse on the computer.  Valerie Perez does not have overt jerking of the entire arm or leg.  The patient denies any asterixis.  Valerie Perez has not had any falls because of the episodes of jerking.  The patient was switched from gabapentin to Lyrica taking 50 mg twice daily 3 months ago and her twitches and jerks significantly improved, but did not go away completely.  The patient has not had any weakness of the extremities.  Valerie Perez denies issues controlling the bowels of the bladder.  Valerie Perez still makes a small amount of urine.  The patient is sent to this office for further evaluation of the above problems.  Past Medical History:  Diagnosis Date  . Anemia   . Arthritis    osteoarthritis  . Constipation   . Depression   . Diabetes mellitus without complication (HCC)    Type II  . DVT (deep venous thrombosis) (HCC) 02/2016  . Dyspnea    with exertion  . Fatty liver   . Heart murmur    just recently told Valerie Perez had a heart murmur, but had never heard this before  . High cholesterol   . Hypertension   . Hypothyroid   . Neuropathy   . Neuropathy   . Pneumonia    09/09/16- "years ago"  . PONV (postoperative nausea and vomiting)   . Renal disorder    dialysis on Tuesday/Thursday/Saturday  . Sleep apnea    uses cpap    Past Surgical History:  Procedure Laterality Date  .  ABDOMINAL HYSTERECTOMY    . BANDING OF RIGHT ARM BRACHIOCEPHALIC ARTERIOVENOUS FISTULA Right 09/10/2016   Performed by Fransisco Hertzhen, Brian L, [facility]  . CHOLECYSTECTOMY  2000  . COLONOSCOPY    . CREATION-RIGHT BRACHIOCEPHALIC ARTERIOVENOUS (AV) FISTULA Right 10/03/2015   Performed by Fransisco Hertzhen, Brian L, [facility] at [facility]  . DEBRIDEMENT OF ABDOMINAL WALL ABSCESS  01/14/2016  . EYE SURGERY Bilateral    Cataract  . FISTULA SUPERFICIALIZATION RIGHT UPPER ARM Right 01/30/2016   Performed by Fransisco Hert[facility]hen, [facility] L, [facility]  . KIDNEY TRANSPLANT  2000  . Left Heart Cath and Coronary Angiography N/A 03/14/2016   Performed by Yvonne KendallEnd, Christopher, MD [facility]  . Upper Extremity Angiography - Right Arm N/A 09/08/2016   Performed by Fransisco Hert[facility]hen, Brian L[facility]    Family History  Problem Relation Age of Onset  . Colon cancer Mother   . Thyroid cancer Father   . Colon cancer Sister   . Hypertension Sister   . Prostate cancer Brother     Social history:  reports that  has never smoked. Valerie Perez has never used smokeless tobacco. Valerie Perez reports that Valerie Perez does not drink alcohol or use drugs.  Medications:  Prior to Admission medications   Medication  Sig Start Date End Date Taking? Authorizing Provider  acetaminophen (TYLENOL) 500 MG tablet Take 1,000 mg by mouth every 6 (six) hours as needed for moderate pain.    Yes [provider]  allopurinol (ZYLOPRIM) 300 MG tablet Take 300 mg by mouth at bedtime.    Yes [provider]  amLODipine (NORVASC) 5 MG tablet Take 5 mg by mouth every evening.    Yes [provider]  aspirin EC 81 MG tablet Take 81 mg by mouth daily.  01/14/16  Yes [provider]  buPROPion (WELLBUTRIN SR) 150 MG 12 hr tablet Take 150 mg by mouth 2 (two) times daily.   Yes [provider]  cloNIDine (CATAPRES) 0.2 MG tablet Take 1 tablet (0.2 mg total) by mouth 3 (three) times daily. 03/26/16  Yes Mikhail, Nita Sells, DO  DULoxetine (CYMBALTA) 60 MG  capsule Take 60 mg by mouth at bedtime.   Yes [provider]  ferric citrate (AURYXIA) 1 GM 210 MG(Fe) tablet Take 420 mg by mouth 3 (three) times daily with meals. May take an additional 420mg  with snacks   Yes [provider]  fluticasone (FLONASE) 50 MCG/ACT nasal spray Place 1 spray into both nostrils daily as needed for allergies.    Yes [provider]  hydrALAZINE (APRESOLINE) 100 MG tablet Take 1 tablet (100 mg total) by mouth every 8 (eight) hours. Patient taking differently: Take 100 mg by mouth every 8 (eight) hours. Takes at 0600, 1400 and 2200 03/26/16  Yes Mikhail, Peters, DO  isosorbide mononitrate (IMDUR) 30 MG 24 hr tablet Take 2 tablets (60 mg total) by mouth 2 (two) times daily. 09/10/16  Yes Rhyne, Ames Coupe, PA-C  levothyroxine (SYNTHROID, LEVOTHROID) 75 MCG tablet Take 1 tablet (75 mcg total) by mouth daily before breakfast. 04/30/15  Yes Jana Half, MD  loratadine (CLARITIN) 10 MG tablet Take 10 mg by mouth daily.    Yes [provider]  montelukast (SINGULAIR) 10 MG tablet Take 10 mg by mouth at bedtime.    Yes [provider]  Naphazoline HCl (CLEAR EYES OP) Apply 1 drop to eye daily as needed (dry eyes).   Yes [provider]  polyethylene glycol (MIRALAX / GLYCOLAX) packet Take 17 g by mouth daily.   Yes [provider]  rosuvastatin (CRESTOR) 20 MG tablet Take 20 mg by mouth daily. 04/02/16  Yes [provider]  SENSIPAR 30 MG tablet Take 30 mg by mouth daily.  07/30/15  Yes [provider]      Allergies  Allergen Reactions  . Atorvastatin Other (See Comments)    Pt experienced severe leg cramping.  . Tape Other (See Comments)    Tears skin Has to use paper tape  . 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    ROS:  Out of a complete 14 system review of symptoms, the patient complains only of the following symptoms, and all other reviewed systems are  negative.  Fatigue Snoring Constipation Moles Easy bruising Increased thirst Joint pain Memory loss, confusion, dizziness, tremor  Blood pressure 140/76, pulse 80, height 5\' 5"  (1.651 m), weight 198 lb (89.8 kg).  Physical Exam  General: The patient is alert and cooperative at the time of the examination.  The patient is moderately obese.  Eyes: Pupils are equal, round, and reactive to light. Discs are flat bilaterally.  Neck: The neck is supple, no carotid bruits are noted.  Respiratory: The respiratory examination is  clear.  Cardiovascular: The cardiovascular examination reveals a regular rate and rhythm, no obvious murmurs or rubs are noted.  Skin: Extremities are without significant edema.  Neurologic Exam  Mental status: The patient is alert and oriented x 3 at the time of the examination. The patient has apparent normal recent and remote memory, with an apparently normal attention span and concentration ability.  Cranial nerves: Facial symmetry is present. There is good sensation of the face to pinprick and soft touch bilaterally. The strength of the facial muscles and the muscles to head turning and shoulder shrug are normal bilaterally. Speech is well enunciated, no aphasia or dysarthria is noted. Extraocular movements are full. Visual fields are full. The tongue is midline, and the patient has symmetric elevation of the soft palate. No obvious hearing deficits are noted.  Motor: The motor testing reveals 5 over 5 strength of all 4 extremities. Good symmetric motor tone is noted throughout.  Sensory: Sensory testing is intact to pinprick, soft touch, vibration sensation, and position sense on all 4 extremities. No evidence of extinction is noted.  Coordination: Cerebellar testing reveals good finger-nose-finger and heel-to-shin bilaterally.  No asterixis was seen with the arms outstretched.  No episodes of myoclonus were noted.  Gait and station: Gait is normal. Tandem  gait is unsteady. Romberg is negative. No drift is seen.  Reflexes: Deep tendon reflexes are symmetric, but are slightly depressed bilaterally. Toes are downgoing bilaterally.   Assessment/Plan:  1.  Diabetic peripheral neuropathy  2.  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  Valerie Perez has done better on the Lyrica in this regard.  If needed, the Lyrica can be reduced taking the 25 mg capsules rather than the 50 mg capsules.  The patient does not believe that the problem is significant enough at this time to warrant a medication change.  I will have the patient contact me if her condition changes or worsens in the future.  Marlan Palau. Keith Claris Guymon MD 01/12/2017 10:18 AM  [facility] [address] [place], [place] [id number]  Phone [phone] Fax [phone]

## 2017-01-30 ENCOUNTER — Ambulatory Visit: Payer: Commercial Managed Care - PPO | Admitting: Vascular Surgery

## 2017-02-24 DIAGNOSIS — Z940 Kidney transplant status: Secondary | ICD-10-CM

## 2017-02-24 HISTORY — DX: Kidney transplant status: Z94.0

## 2017-02-26 NOTE — Progress Notes (Signed)
Established Dialysis Access   History of Present Illness   Valerie Perez is a 57 y.o. (1960/04/11) female who presents for cc:  Pain in hand during HD.  This patient previously underwent R BC AVF on 10/03/15 with superficialization on 01/30/16.  She developed steal sx and underwent R arm angiogram on 09/08/16 which did not demonstrate significant radial or ulnar arterial disease.  She underwent banding of the R BC AVF on 09/10/16.  This appeared to resolve her steal sx on follow up on 10/15/16.  Her current sx are: similar to previous, L hand pain and numbness during HD.  Pt notes compression of R elbow worsens her sx.  The patient's PMH, PSH, SH, and FamHx are unchanged from 10/15/16.  Current Outpatient Medications  Medication Sig Dispense Refill  . acetaminophen (TYLENOL) 500 MG tablet Take 1,000 mg by mouth every 6 (six) hours as needed for moderate pain.     Marland Kitchen. allopurinol (ZYLOPRIM) 300 MG tablet Take 300 mg by mouth at bedtime.     Marland Kitchen. amLODipine (NORVASC) 5 MG tablet Take 5 mg by mouth every evening.     Marland Kitchen. aspirin EC 81 MG tablet Take 81 mg by mouth daily.     Marland Kitchen. buPROPion (WELLBUTRIN SR) 150 MG 12 hr tablet Take 150 mg by mouth 2 (two) times daily.    . cloNIDine (CATAPRES) 0.2 MG tablet Take 1 tablet (0.2 mg total) by mouth 3 (three) times daily. 90 tablet 0  . DULoxetine (CYMBALTA) 60 MG capsule Take 60 mg by mouth at bedtime.    . ferric citrate (AURYXIA) 1 GM 210 MG(Fe) tablet Take 420 mg by mouth 3 (three) times daily with meals. May take an additional 420mg  with snacks    . fluticasone (FLONASE) 50 MCG/ACT nasal spray Place 1 spray into both nostrils daily as needed for allergies.     . hydrALAZINE (APRESOLINE) 100 MG tablet Take 1 tablet (100 mg total) by mouth every 8 (eight) hours. (Patient taking differently: Take 100 mg by mouth every 8 (eight) hours. Takes at 0600, 1400 and 2200) 90 tablet 0  . isosorbide mononitrate (IMDUR) 30 MG 24 hr tablet Take 2 tablets (60 mg total) by  mouth 2 (two) times daily.    Marland Kitchen. levothyroxine (SYNTHROID, LEVOTHROID) 75 MCG tablet Take 1 tablet (75 mcg total) by mouth daily before breakfast. 30 tablet 0  . loratadine (CLARITIN) 10 MG tablet Take 10 mg by mouth daily.     . montelukast (SINGULAIR) 10 MG tablet Take 10 mg by mouth at bedtime.     . Naphazoline HCl (CLEAR EYES OP) Apply 1 drop to eye daily as needed (dry eyes).    . polyethylene glycol (MIRALAX / GLYCOLAX) packet Take 17 g by mouth daily.    . rosuvastatin (CRESTOR) 20 MG tablet Take 20 mg by mouth daily.    . SENSIPAR 30 MG tablet Take 30 mg by mouth daily.      No current facility-administered medications for this visit.     On ROS today: +cubital tunnel sx, no muscle atrophy   Physical Examination   Vitals:   02/27/17 1417  BP: (!) 149/82  Pulse: 82  Resp: 16  Temp: (!) 97.2 F (36.2 C)  TempSrc: Oral  SpO2: 96%  Weight: 200 lb (90.7 kg)  Height: 5\' 5"  (1.651 m)   Body mass index is 33.28 kg/m.  General Alert, O x 3, WD, NAD  Pulmonary Sym exp, good B air movt,  CTA B  Cardiac RRR, Nl S1, S2, no Murmurs, No rubs, No S3,S4  Vascular Vessel Right Left  Radial Not palpable Faintly palpable  Brachial Palpable Palpable  Ulnar Palpable Palpable    Musculo- skeletal M/S 5/5 throughout  , Extremities without ischemic changes    Neurologic Pain and light touch intact in extremities , Motor exam as listed above    Medical Decision Making   Valerie Perez is a 57 y.o. 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    I think its probably worthwhile getting Hand Surgery to evaluate for such prior considering other options.  There is no long term data on DRIL so I'm reluctant to ligate a brachial artery as part of a DRIL in <49 year old patient.  Will have the patient follow up after the Hand Surgery evaluation.   Leonides Sake, MD, FACS [facility] Office: [phone] Pager: [phone]

## 2017-02-27 ENCOUNTER — Ambulatory Visit (INDEPENDENT_AMBULATORY_CARE_PROVIDER_SITE_OTHER): Payer: Commercial Managed Care - PPO | Admitting: Vascular Surgery

## 2017-02-27 ENCOUNTER — Encounter: Payer: Self-pay | Admitting: Vascular Surgery

## 2017-02-27 VITALS — BP 149/82 | HR 82 | Temp 97.2°F | Resp 16 | Ht 65.0 in | Wt 200.0 lb

## 2017-02-27 DIAGNOSIS — Z992 Dependence on renal dialysis: Secondary | ICD-10-CM | POA: Diagnosis not present

## 2017-02-27 DIAGNOSIS — G5621 Lesion of ulnar nerve, right upper limb: Secondary | ICD-10-CM | POA: Diagnosis not present

## 2017-02-27 DIAGNOSIS — N186 End stage renal disease: Secondary | ICD-10-CM

## 2017-03-23 NOTE — Progress Notes (Deleted)
Established Dialysis Access   History of Present Illness   Valerie Perez is a 57 y.o. (November 04, 1960) female who presents for cc:  Pain in hand during HD.  This patient previously underwent R BC AVF on 10/03/15 with superficialization on 01/30/16.  She developed steal sx and underwent R arm angiogram on 09/08/16 which did not demonstrate significant radial or ulnar arterial disease.  She underwent banding of the R BC AVF on 09/10/16.  This appeared to resolve her steal sx on follow up on 10/15/16.  Her sx were consistent with cubital tunnel syndrome so I referred her to Hand Surgery for evaluation.  ***  The patient's PMH, PSH, SH, and FamHx are unchanged from 02/27/17  Current Outpatient Medications  Medication Sig Dispense Refill  . acetaminophen (TYLENOL) 500 MG tablet Take 1,000 mg by mouth every 6 (six) hours as needed for moderate pain.     Marland Kitchen. allopurinol (ZYLOPRIM) 300 MG tablet Take 300 mg by mouth at bedtime.     Marland Kitchen. amLODipine (NORVASC) 5 MG tablet Take 5 mg by mouth every evening.     Marland Kitchen. aspirin EC 81 MG tablet Take 81 mg by mouth daily.     Marland Kitchen. buPROPion (WELLBUTRIN SR) 150 MG 12 hr tablet Take 150 mg by mouth 2 (two) times daily.    . cloNIDine (CATAPRES) 0.2 MG tablet Take 1 tablet (0.2 mg total) by mouth 3 (three) times daily. 90 tablet 0  . DULoxetine (CYMBALTA) 60 MG capsule Take 60 mg by mouth at bedtime.    . ferric citrate (AURYXIA) 1 GM 210 MG(Fe) tablet Take 420 mg by mouth 3 (three) times daily with meals. May take an additional 420mg  with snacks    . fluticasone (FLONASE) 50 MCG/ACT nasal spray Place 1 spray into both nostrils daily as needed for allergies.     . hydrALAZINE (APRESOLINE) 100 MG tablet Take 1 tablet (100 mg total) by mouth every 8 (eight) hours. (Patient taking differently: Take 100 mg by mouth every 8 (eight) hours. Takes at 0600, 1400 and 2200) 90 tablet 0  . isosorbide mononitrate (IMDUR) 30 MG 24 hr tablet Take 2 tablets (60 mg total) by mouth 2 (two)  times daily.    Marland Kitchen. levothyroxine (SYNTHROID, LEVOTHROID) 75 MCG tablet Take 1 tablet (75 mcg total) by mouth daily before breakfast. 30 tablet 0  . loratadine (CLARITIN) 10 MG tablet Take 10 mg by mouth daily.     . montelukast (SINGULAIR) 10 MG tablet Take 10 mg by mouth at bedtime.     . Naphazoline HCl (CLEAR EYES OP) Apply 1 drop to eye daily as needed (dry eyes).    . polyethylene glycol (MIRALAX / GLYCOLAX) packet Take 17 g by mouth daily.    . pregabalin (LYRICA) 25 MG capsule Take 25 mg by mouth 2 (two) times daily.    . rosuvastatin (CRESTOR) 20 MG tablet Take 20 mg by mouth daily.    . SENSIPAR 30 MG tablet Take 30 mg by mouth daily.      No current facility-administered medications for this visit.     On ROS today: ***, ***   Physical Examination   ***There were no vitals filed for this visit. ***There is no height or weight on file to calculate BMI.  General Alert, O x 3, WD, NAD  Pulmonary Sym exp, good B air movt, CTA B  Cardiac RRR, Nl S1, S2, no Murmurs, No rubs, No S3,S4  Vascular Vessel Right Left  Radial Not palpable Faintly palpable  Brachial Palpable Palpable  Ulnar Palpable Palpable    Musculo- skeletal M/S 5/5 throughout  , Extremities without ischemic changes    Neurologic Pain and light touch intact in extremities , Motor exam as listed above    Medical Decision Making   Valerie Perez is a 57 y.o. 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    I think its probably worthwhile getting Hand Surgery to evaluate for such prior considering other options.  There is no long term data on DRIL so I'm reluctant to ligate a brachial artery as part of a DRIL in <64 year old patient.  Will have the patient follow up after the Hand Surgery evaluation.   Valerie Sake, MD, FACS [facility] Office:  [phone] Pager: [phone]

## 2017-03-27 ENCOUNTER — Ambulatory Visit: Payer: Commercial Managed Care - PPO | Admitting: Vascular Surgery

## 2017-04-27 ENCOUNTER — Ambulatory Visit (INDEPENDENT_AMBULATORY_CARE_PROVIDER_SITE_OTHER): Payer: Commercial Managed Care - PPO | Admitting: Internal Medicine

## 2017-04-27 ENCOUNTER — Encounter: Payer: Self-pay | Admitting: Internal Medicine

## 2017-04-27 VITALS — BP 114/68 | HR 60 | Ht 65.0 in | Wt 201.8 lb

## 2017-04-27 DIAGNOSIS — G4733 Obstructive sleep apnea (adult) (pediatric): Secondary | ICD-10-CM | POA: Diagnosis not present

## 2017-04-27 DIAGNOSIS — N186 End stage renal disease: Secondary | ICD-10-CM

## 2017-04-27 DIAGNOSIS — Z992 Dependence on renal dialysis: Secondary | ICD-10-CM

## 2017-04-27 NOTE — Assessment & Plan Note (Signed)
She feels she continues to do well with CPAP auto 5-15/[facility].  Incidental increased morning sleepiness just in the last few weeks may change with season.  She is satisfied she is better off with CPAP and wishes to continue.

## 2017-04-27 NOTE — Progress Notes (Signed)
HPI 57 year old female never smoker followed for OSA complicated by ESRD/dialysis, right popliteal DVT, history cardiac arrest/shock/acute hypoxic hypercapnic respiratory failure 2018 NPSG 04/25/2005- AHI 51.8/ hr, desaturation to  73%, CPAP titrated to 12, body weight 220 lbs  ------------------------------------------------------------------------ 09/01/16- 57 year old female never smoker followed for OSA complicated by ESRD/dialysis, right popliteal DVT, history cardiac arrest/shock/acute hypoxic hypercapnic respiratory failure 2018 CPAP auto 5-15/[facility] Pt states that she is not sleeping as good now as she did with her other CPAP machine. Pt states that she is dreaming throughout the night and wakes up more tired. BP on arrival today 180/90, weight 200 pounds Feels more restless at night. Not sure if there is really a difference and comfort between her previous fixed CPAP pressure and current AutoPap but she thinks she would like to go back to fixed pressure 12.  04/27/17- 57 year old female never smoker followed for OSA complicated by ESRD/dialysis, right popliteal DVT, history cardiac arrest/shock/acute hypoxic hypercapnic respiratory failure 2018 CPAP auto 5-15/[facility] ----OSA: DME: [facility]. Wears CPAP for at least 7-7.5 hours each night. No new supplies needed at this time,  Sleep here in the mornings for the past 3 or 4 weeks with no change in sleep pattern medications or other reasons she identifies.  Little waking at night.  No sleep medicines. Remains comfortable with CPAP auto 5-15.  Machine new in the past year.  She is satisfied she continues to benefit.  ROS-see HPI   + = positive Constitutional:    weight loss, night sweats, fevers, chills, fatigue, lassitude. HEENT:    headaches, difficulty swallowing, tooth/dental problems, sore throat,       sneezing, itching, ear ache, nasal congestion, post nasal drip, snoring CV:    chest pain, orthopnea,  PND, swelling in lower extremities, anasarca,                                                     dizziness, palpitations Resp:  + shortness of breath with exertion or at rest.                productive cough,   non-productive cough, coughing up of blood.              change in color of mucus.  wheezing.   Skin:    rash or lesions. GI:  No-   heartburn, indigestion, abdominal pain, nausea, vomiting, diarrhea,                 change in bowel habits, loss of appetite GU: dysuria, change in color of urine, no urgency or frequency.   flank pain. MS:   joint pain, stiffness, decreased range of motion, back pain. Neuro-     nothing unusual Psych:  change in mood or affect.  depression or anxiety.   memory loss.  OBJ- Physical Exam      General- Alert, Oriented, Affect-appropriate, Distress- none acute Skin- rash-none, lesions- none, excoriation- none Lymphadenopathy- none Head- atraumatic            Eyes- Gross vision intact, PERRLA, conjunctivae and secretions clear            Ears- Hearing, canals-normal            Nose- Clear, no-Septal dev, mucus, polyps, erosion, perforation  Throat- Mallampati IV , mucosa clear , drainage- none, tonsils-present Neck- flexible , trachea midline, no stridor , thyroid nl, carotid no bruit Chest - symmetrical excursion , unlabored           Heart/CV- RRR ,  Murmur+ 2/6 LLSB , no gallop  , no rub, nl s1 s2                           - JVD- none , edema- none, stasis changes- none, varices- none           Lung- clear to P&A, wheeze- none, cough- none , dullness-none, rub- none           Chest wall-  Abd-  Br/ Gen/ Rectal- Not done, not indicated Extrem- cyanosis- none, clubbing, none, atrophy- none, strength- nl. +HD access R upper arm Neuro- grossly intact to observation

## 2017-04-27 NOTE — Assessment & Plan Note (Signed)
She continues dialysis managed by nephrology.

## 2017-04-27 NOTE — Patient Instructions (Signed)
We can continue CPAP auto 5-15, mask of choice, humidifier, supplies  Please call if we can help

## 2017-12-09 IMAGING — CR DG CHEST 1V PORT
1 series · 1 of 1 positions shown · non-contrast
Comparison: March 11, 2016

CLINICAL DATA: Hypoxia

EXAM:
PORTABLE CHEST 1 VIEW

[AP]
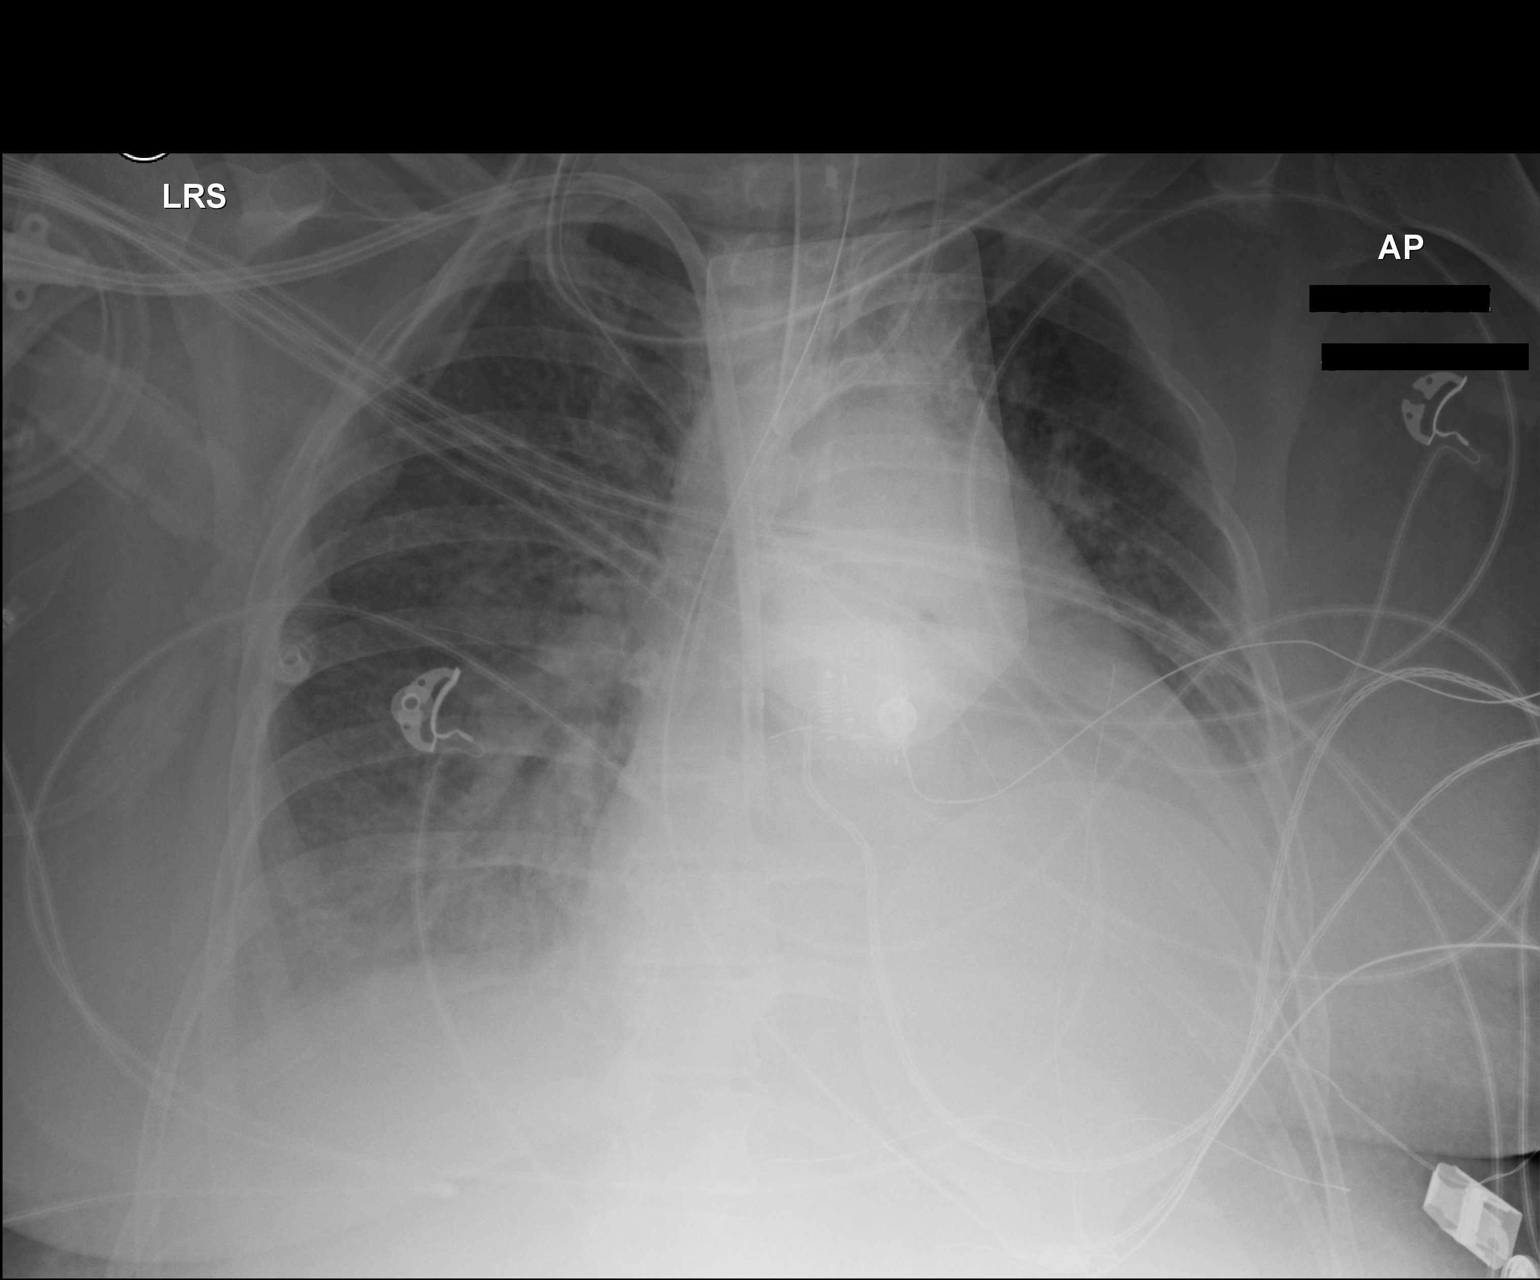

[1 of 1 positions shown; findings below may reference images not displayed]

FINDINGS: Endotracheal tube tip is 3.1 cm above the carina. Central catheter
tip is at the cavoatrial junction. Nasogastric tube tip and side
port below the diaphragm. No pneumothorax. There are bilateral
pleural effusions with mild pulmonary edema ; there is slightly less
interstitial pulmonary edema compared to 1 day prior. There is
stable cardiomegaly with mild pulmonary venous hypertension. No new
opacity. No evident adenopathy. No bone lesions.
IMPRESSION: Tube and catheter positions as described without pneumothorax.
Slightly less edema compared to 1 day prior. There are pleural
effusions bilaterally as well as cardiomegaly and pulmonary venous
hypertension consistent with a degree of persistent congestive heart
failure. No new opacity.

## 2017-12-10 IMAGING — CR DG CHEST 1V PORT
1 series · 1 of 1 positions shown · non-contrast
Comparison: 03/12/2016

CLINICAL DATA: Cardiac arrest. Difficulty breathing. History of
diabetes and hypertension.

EXAM:
PORTABLE CHEST 1 VIEW

[AP]
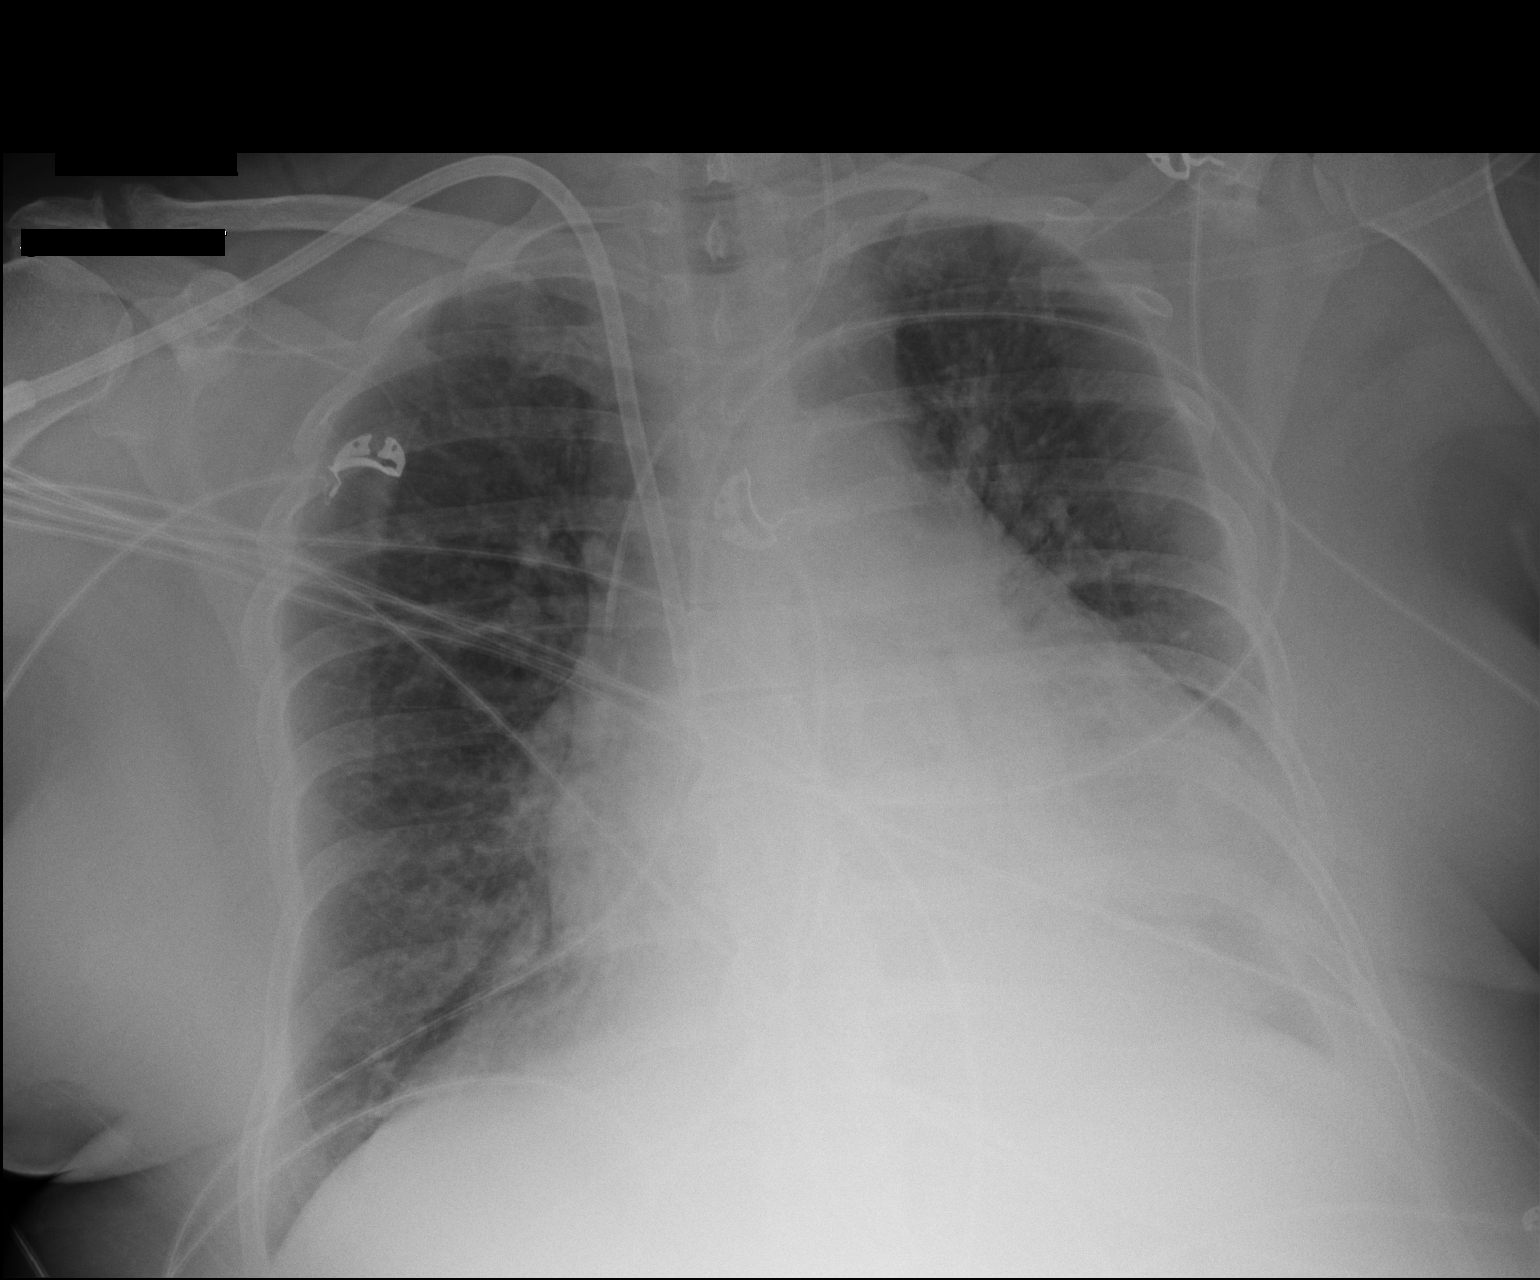

[1 of 1 positions shown; findings below may reference images not displayed]

FINDINGS: There is a left IJ catheter with tip in the projection of the SVC.
There is a right subclavian dialysis catheter with tips in the
projection of the cavoatrial junction. Moderate cardiac enlargement.
Pulmonary vascular congestion identified.
IMPRESSION: 1. Cardiac enlargement and pulmonary vascular congestion.

## 2017-12-10 IMAGING — CR DG TIBIA/FIBULA PORT 2V*R*
3 series · 3 of 3 positions shown · non-contrast
Comparison: None.

CLINICAL DATA: right knee and lower leg pain

EXAM:
PORTABLE RIGHT TIBIA AND FIBULA - 2 VIEW

[AP (1 of 2)]
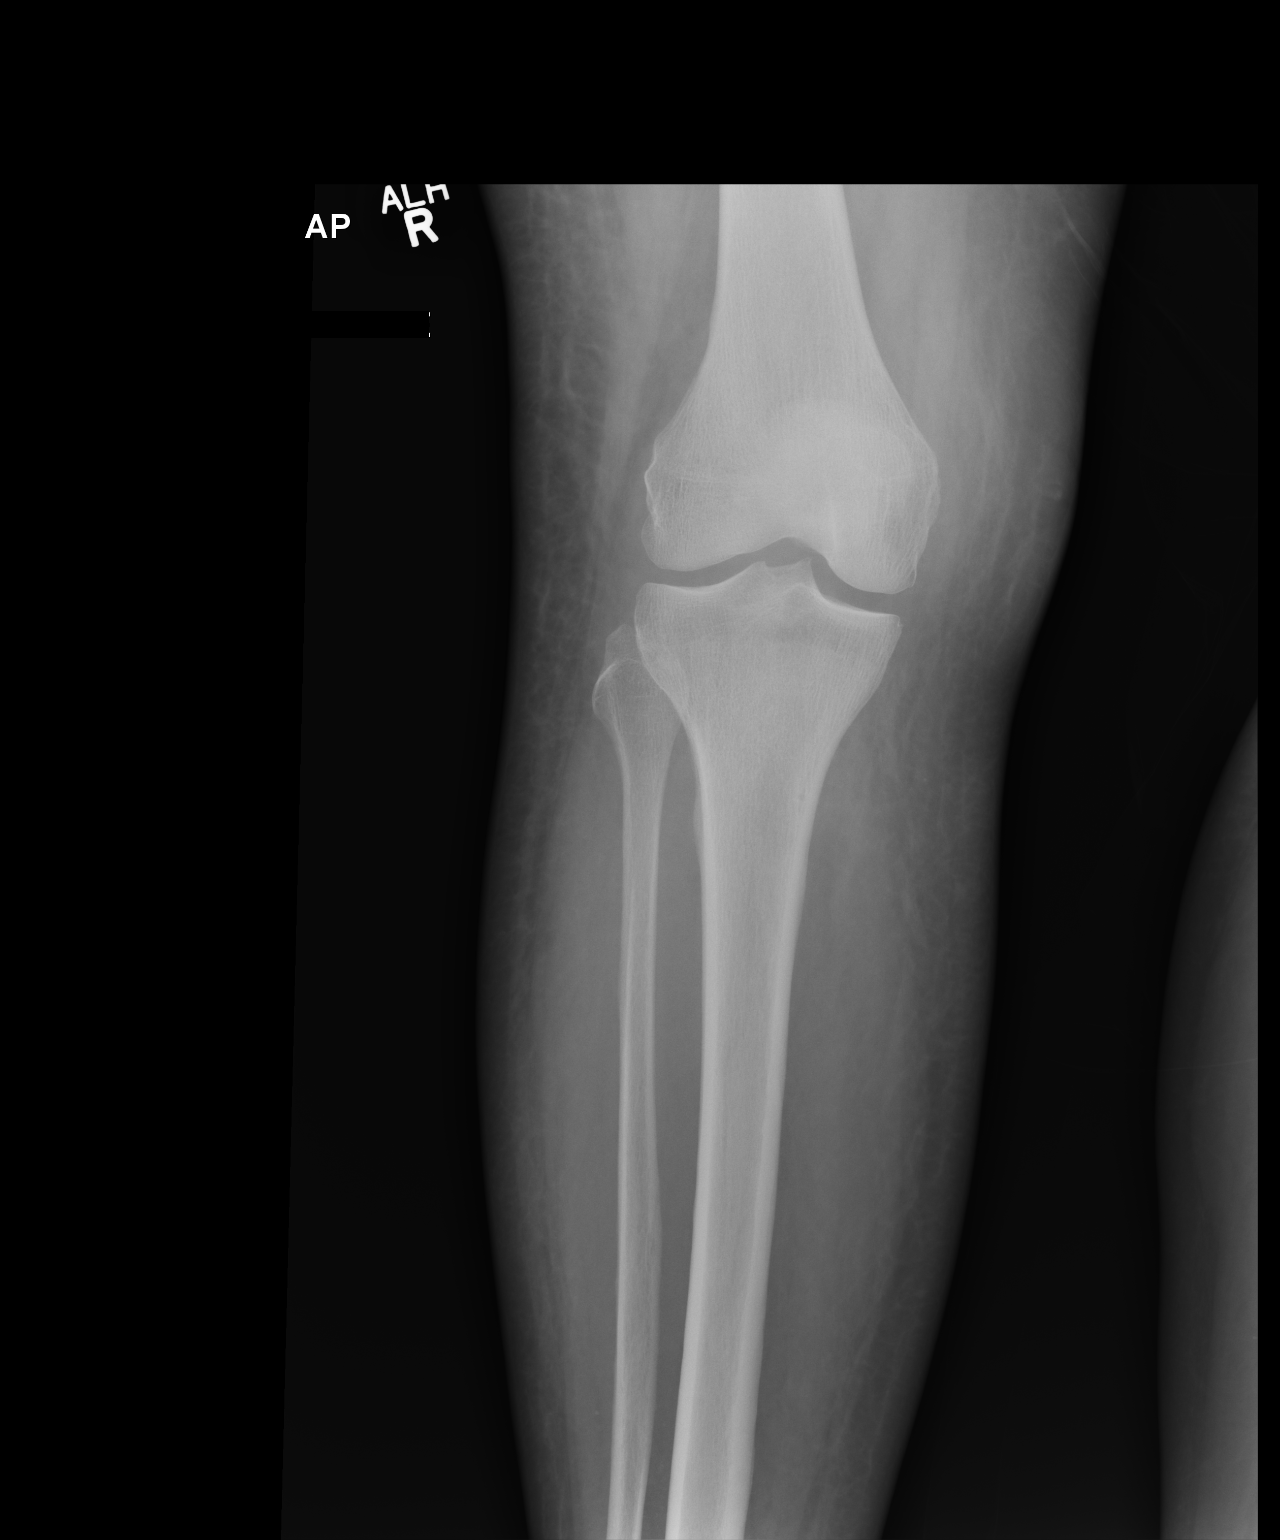

[AP (2 of 2)]
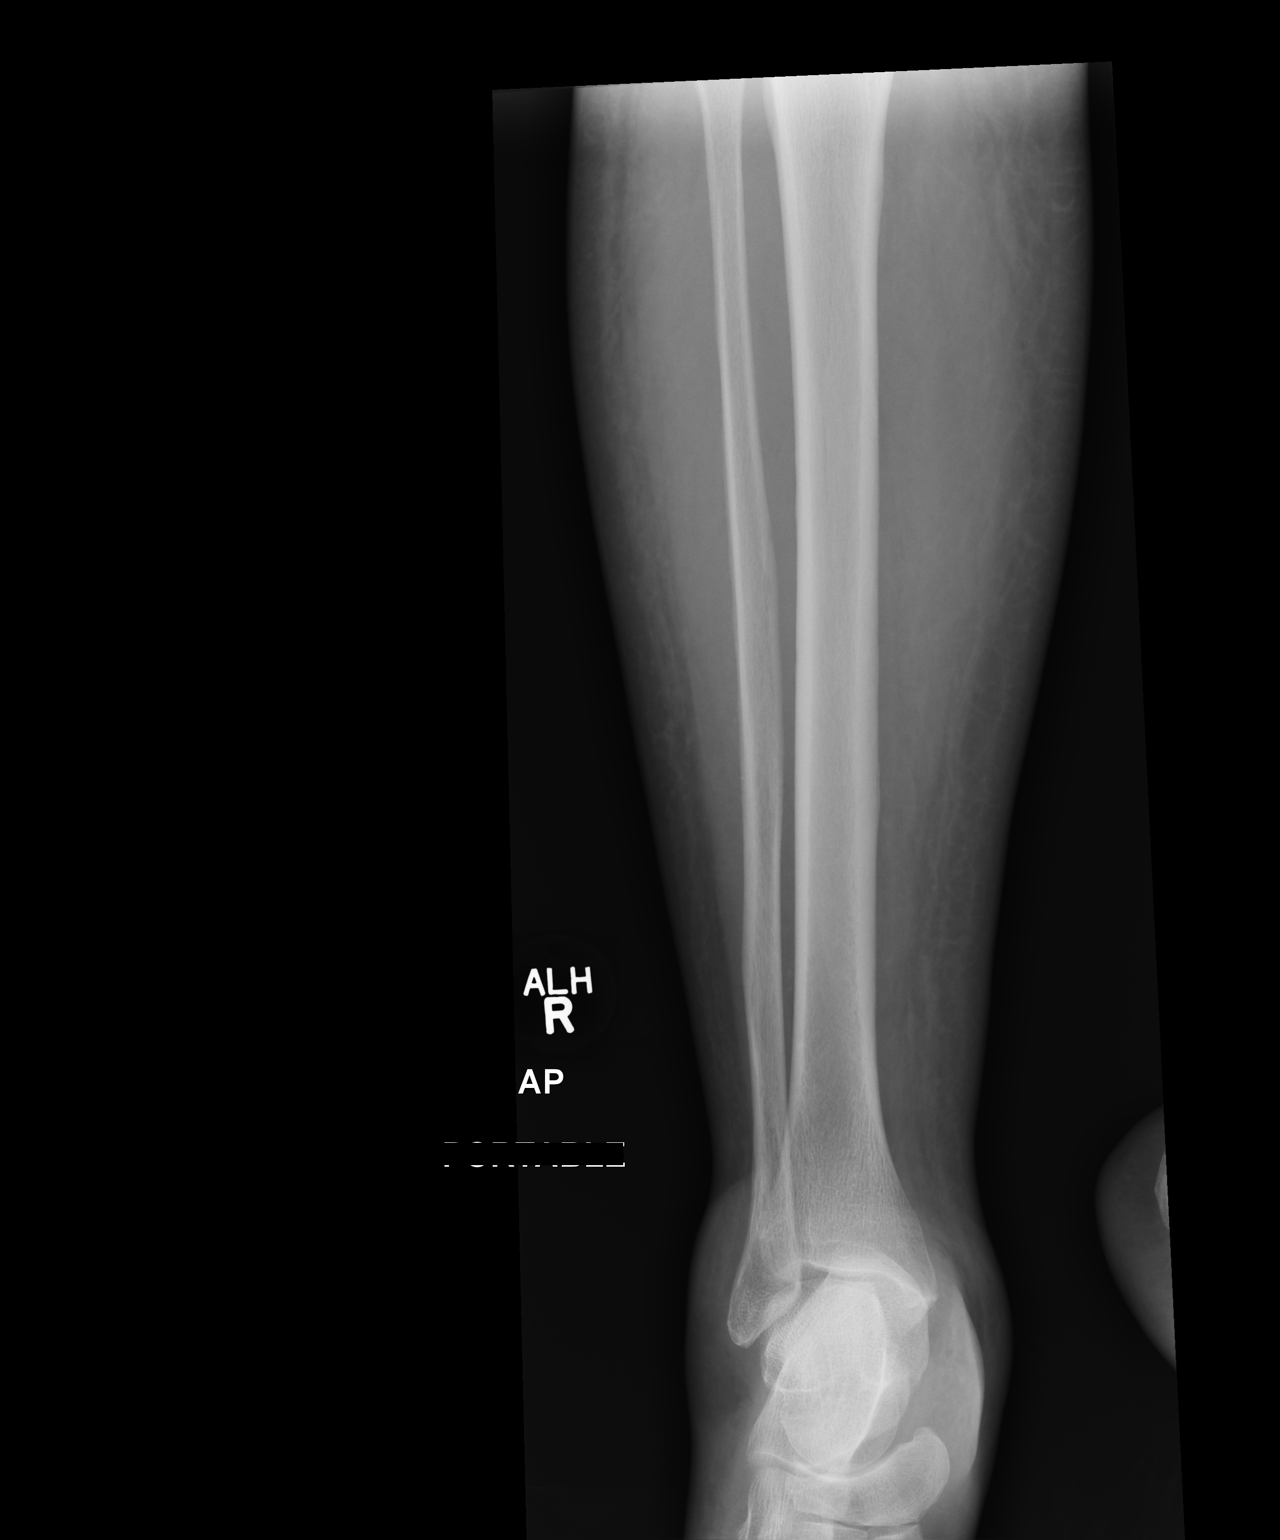

[lateral]
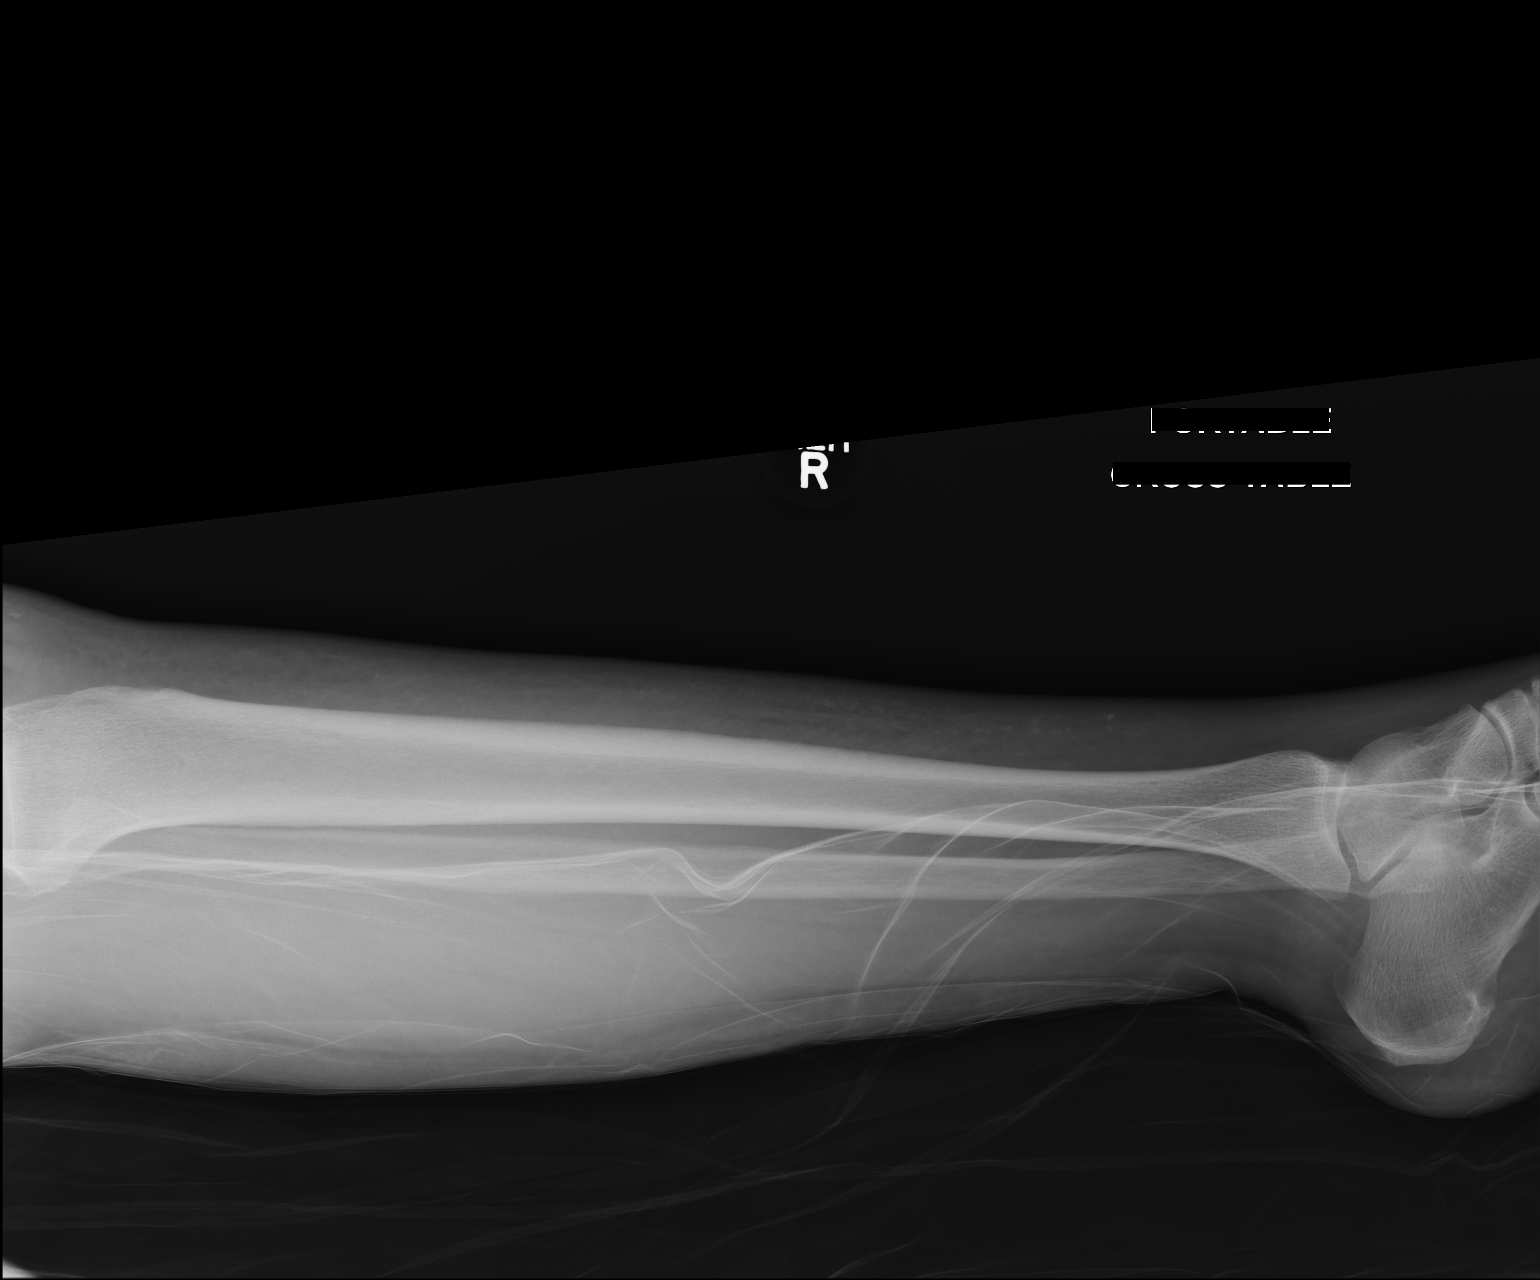

[3 of 3 positions shown; findings below may reference images not displayed]

FINDINGS: There is mild diffuse soft tissue swelling. There is no underlying
fracture or subluxation. Mild degenerative changes are noted within
the right knee.
IMPRESSION: 1. No acute bone abnormality.
2. Soft tissue swelling.

## 2018-04-28 ENCOUNTER — Ambulatory Visit: Payer: Commercial Managed Care - PPO | Admitting: Internal Medicine

## 2019-04-07 ENCOUNTER — Ambulatory Visit: Payer: Medicare Other

## 2019-09-29 ENCOUNTER — Ambulatory Visit (INDEPENDENT_AMBULATORY_CARE_PROVIDER_SITE_OTHER): Payer: Medicare Other

## 2019-09-29 ENCOUNTER — Ambulatory Visit
Admission: EM | Admit: 2019-09-29 | Discharge: 2019-09-29 | Disposition: A | Discharge status: Home / Self Care | Payer: Medicare Other

## 2019-09-29 ENCOUNTER — Other Ambulatory Visit: Payer: Self-pay

## 2019-09-29 ENCOUNTER — Encounter: Payer: Self-pay | Admitting: Emergency Medicine

## 2019-09-29 DIAGNOSIS — S62101A Fracture of unspecified carpal bone, right wrist, initial encounter for closed fracture: Secondary | ICD-10-CM

## 2019-09-29 DIAGNOSIS — M25531 Pain in right wrist: Secondary | ICD-10-CM

## 2019-09-29 DIAGNOSIS — W1830XA Fall on same level, unspecified, initial encounter: Secondary | ICD-10-CM

## 2019-09-29 NOTE — ED Triage Notes (Addendum)
Pt presents to [facility] for assessment after tripping and falling onto a brick walkway.  States she hit her head on the gutter.  Denies LOC.  C/o right wrist/hand pain, right knee pain/abrasion.  Pt takes an 81mg  aspirin every day.  Pt has a fistula in her right upper arm, but is not being used (successful kidney transplant)

## 2019-09-29 NOTE — ED Provider Notes (Signed)
[facility]    CSN: [id number] Arrival date & time: 09/29/19  1150      History   Chief Complaint Chief Complaint  Patient presents with  . Fall    HPI Valerie Perez is a 59 y.o. female presenting for right knee pain and abrasion as well as right wrist pain and swelling s/p fall.  Patient provides history: States that she tripped on a brick walkway today.  Hit her head on the gutter: No LOC, change in vision, chest pain, palpitations, vomiting, memory loss.  No numbness or deformity.    Past Medical History:  Diagnosis Date  . Anemia   . Arthritis    osteoarthritis  . Constipation   . Depression   . Diabetes mellitus without complication (HCC)    Type II  . DVT (deep venous thrombosis) (HCC) 02/2016  . Dyspnea    with exertion  . Fatty liver   . Heart murmur    just recently told she had a heart murmur, but had never heard this before  . High cholesterol   . Hypertension   . Hypothyroid   . Neuropathy   . Neuropathy   . Pneumonia    09/09/16- "years ago"  . PONV (postoperative nausea and vomiting)   . Renal disorder    dialysis on Tuesday/Thursday/Saturday  . Sleep apnea    uses cpap    Patient Active Problem List   Diagnosis Date Noted  . Myoclonus 01/12/2017  . Deep vein thrombosis (DVT) of distal vein of lower extremity (HCC)   . Aspiration pneumonia (HCC)   . Elevated troponin   . Cardiac arrest (HCC) 03/11/2016  . Essential hypertension 04/28/2015  . Hypothyroidism 04/28/2015  . Immunosuppression (HCC) 04/28/2015  . End stage renal disease on dialysis (HCC) 04/28/2015  . Obstructive sleep apnea 05/29/2008  . KIDNEY TRANSPLANTATION, HX OF 05/29/2008    Past Surgical History:  Procedure Laterality Date  . ABDOMINAL HYSTERECTOMY    . AV FISTULA PLACEMENT Right 10/03/2015   Procedure: CREATION-RIGHT BRACHIOCEPHALIC ARTERIOVENOUS (AV) FISTULA;  Surgeon: Fransisco Hertz, MD;  Location: [facility] OR;  Service: Vascular;  Laterality: Right;  .  CARDIAC CATHETERIZATION N/A 03/14/2016   Procedure: Left Heart Cath and Coronary Angiography;  Surgeon: Yvonne Kendall, MD;  Location: [facility] INVASIVE CV LAB;  Service: Cardiovascular;  Laterality: N/A;  . CHOLECYSTECTOMY  2000  . COLONOSCOPY    . DEBRIDEMENT OF ABDOMINAL WALL ABSCESS  01/14/2016  . EYE SURGERY Bilateral    Cataract  . FISTULA SUPERFICIALIZATION Right 01/30/2016   Procedure: FISTULA SUPERFICIALIZATION RIGHT UPPER ARM;  Surgeon: Fransisco Hertz, MD;  Location: [facility] OR;  Service: Vascular;  Laterality: Right;  . KIDNEY TRANSPLANT  2000  . KIDNEY TRANSPLANT    . REVISON OF ARTERIOVENOUS FISTULA Right 09/10/2016   Procedure: BANDING OF RIGHT ARM BRACHIOCEPHALIC ARTERIOVENOUS FISTULA;  Surgeon: Fransisco Hertz, MD;  Location: [facility] OR;  Service: Vascular;  Laterality: Right;  . UPPER EXTREMITY ANGIOGRAPHY N/A 09/08/2016   Procedure: Upper Extremity Angiography - Right Arm;  Surgeon: Fransisco Hertz, MD;  Location: [facility] INVASIVE CV LAB;  Service: Cardiovascular;  Laterality: N/A;  rt.. arm fistula    OB History   No obstetric history on file.      Home Medications    Prior to Admission medications   Medication Sig Start Date End Date Taking? Authorizing Provider  allopurinol (ZYLOPRIM) 300 MG tablet Take 300 mg by mouth at bedtime.    Yes [provider]  aspirin EC 81 MG tablet Take 81 mg by mouth daily.  01/14/16  Yes [provider]  buPROPion (WELLBUTRIN SR) 150 MG 12 hr tablet Take 150 mg by mouth 2 (two) times daily.   Yes [provider]  cloNIDine (CATAPRES) 0.2 MG tablet Take 1 tablet (0.2 mg total) by mouth 3 (three) times daily. 03/26/16  Yes Mikhail, Nita SellsMaryann, DO  cycloSPORINE modified (EON BRAND) 100 MG capsule Take 200 mg by mouth 2 (two) times daily.   Yes [provider]  DULoxetine (CYMBALTA) 60 MG capsule Take 60 mg by mouth at bedtime.   Yes [provider]  fenofibrate 54 MG tablet Take 54 mg by mouth daily.   Yes [provider]  gabapentin (NEURONTIN) 100 MG capsule Take 100-300 mg by mouth at bedtime.   Yes [provider]  K Phos Mono-Sod Phos Di & Mono (PHOSPHA 250 NEUTRAL PO) Take by mouth.   Yes [provider]  levothyroxine (SYNTHROID, LEVOTHROID) 75 MCG tablet Take 1 tablet (75 mcg total) by mouth daily before breakfast. 04/30/15  Yes Jana Halfaylor, Nicholas A, MD  loratadine (CLARITIN) 10 MG tablet Take 10 mg by mouth daily.    Yes [provider]  magnesium oxide (MAG-OX) 400 MG tablet Take 400 mg by mouth daily.   Yes [provider]  metFORMIN (GLUCOPHAGE) 500 MG tablet Take by mouth 2 (two) times daily with a meal.   Yes [provider]  montelukast (SINGULAIR) 10 MG tablet Take 10 mg by mouth at bedtime.    Yes [provider]  mycophenolate (MYFORTIC) 180 MG EC tablet Take 180 mg by mouth 2 (two) times daily.   Yes [provider]  NIFEdipine (ADALAT CC) 90 MG 24 hr tablet Take 90 mg by mouth daily.   Yes [provider]  omeprazole (PRILOSEC) 20 MG capsule Take 20 mg by mouth daily.   Yes [provider]  predniSONE (DELTASONE) 5 MG tablet Take 5 mg by mouth daily with breakfast.   Yes [provider]  SENSIPAR 30 MG tablet Take 30 mg by mouth daily.  07/30/15  Yes [provider]  sodium bicarbonate 650 MG tablet Take 650 mg by mouth 2 (two) times daily.   Yes [provider]  acetaminophen (TYLENOL) 500 MG tablet Take 1,000 mg by mouth every 6 (six) hours as needed for moderate pain.     [provider]  amLODipine (NORVASC) 5 MG tablet Take 5 mg by mouth every evening.     [provider]  ferric citrate (AURYXIA) 1 GM 210 MG(Fe) tablet Take 420 mg by mouth 3 (three) times daily with meals. May take an additional 420mg  with snacks    [provider]  fluticasone (FLONASE) 50 MCG/ACT nasal spray Place 1 spray into both nostrils daily as needed for allergies.      [provider]  hydrALAZINE (APRESOLINE) 100 MG tablet Take 1 tablet (100 mg total) by mouth every 8 (eight) hours. Patient taking differently: Take 100 mg by mouth every 8 (eight) hours. Takes at 0600, 1400 and 2200 03/26/16   Edsel PetrinMikhail, Maryann, DO  isosorbide mononitrate (IMDUR) 30 MG 24 hr tablet Take 2 tablets (60 mg total) by mouth 2 (two) times daily. 09/10/16   Rhyne, Ames CoupeSamantha J, PA-C  Naphazoline HCl (CLEAR EYES OP) Apply 1 drop to eye daily as needed (dry eyes).    [provider]  polyethylene glycol (MIRALAX / GLYCOLAX) packet Take 17  g by mouth daily.    [provider]  pregabalin (LYRICA) 25 MG capsule Take 25 mg by mouth 2 (two) times daily.    [provider]  rosuvastatin (CRESTOR) 20 MG tablet Take 20 mg by mouth daily. 04/02/16   [provider]    Family History Family History  Problem Relation Age of Onset  . Colon cancer Mother   . Thyroid cancer Father   . Colon cancer Sister   . Hypertension Sister   . Prostate cancer Brother     Social History Social History   Tobacco Use  . Smoking status: Never Smoker  . Smokeless tobacco: Never Used  Vaping Use  . Vaping Use: Never used  Substance Use Topics  . Alcohol use: No  . Drug use: No     Allergies   Iron dextran, Iron sucrose, Atorvastatin, Tape, Latex, and Aspirin   Review of Systems As per HPI   Physical Exam Triage Vital Signs ED Triage Vitals  Enc Vitals Group     BP      Pulse      Resp      Temp      Temp src      SpO2      Weight      Height      Head Circumference      Peak Flow      Pain Score      Pain Loc      Pain Edu?      Excl. in GC?    No data found.  Updated Vital Signs BP (!) 178/86 (BP Location: Left Arm)   Pulse 89   Temp 97.7 F (36.5 C) (Oral)   Resp (!) 22   SpO2 95%   Visual Acuity Right Eye Distance:   Left Eye Distance:   Bilateral Distance:    Right Eye Near:   Left Eye Near:    Bilateral Near:      Physical Exam Constitutional:      General: She is not in acute distress. HENT:     Head: Normocephalic and atraumatic.  Eyes:     General: No scleral icterus.    Pupils: Pupils are equal, round, and reactive to light.  Cardiovascular:     Rate and Rhythm: Normal rate.  Pulmonary:     Effort: Pulmonary effort is normal.  Musculoskeletal:        General: Swelling and tenderness present.     Comments: Bruising to right forearm noted.  Patient has wrist tenderness (radius> ulna).  NVI. Right knee with mild swelling, minimal crepitus.  Superficial abrasion.  Full ROM, NVI.  Skin:    Coloration: Skin is not jaundiced or pale.  Neurological:     Mental Status: She is alert and oriented to person, place, and time.      UC Treatments / Results  Labs (all labs ordered are listed, but only abnormal results are displayed) Labs Reviewed - No data to display  EKG   Radiology DG Wrist Complete Right  Result Date: 09/29/2019 CLINICAL DATA:  Right wrist pain after fall today. EXAM: RIGHT WRIST - COMPLETE 3+ VIEW COMPARISON:  None. FINDINGS: Mildly displaced fracture is seen involving the distal right radius with possible intra-articular extension. Joint spaces are intact. No soft tissue abnormality is noted. IMPRESSION: Mildly displaced distal right radial fracture with possible intra-articular extension. Electronically Signed   By: Lupita Raider M.D.   On: 09/29/2019 12:29  Procedures Procedures (including critical care time)  Medications Ordered in UC Medications - No data to display  Initial Impression / Assessment and Plan / UC Course  I have reviewed the triage vital signs and the nursing notes.  Pertinent labs & imaging results that were available during my care of the patient were reviewed by me and considered in my medical decision making (see chart for details).     No LOC, systemic symptoms that are concerning for TBI as outlined in HPI.  No neurocognitive deficit.   X-ray done in office, reviewed by me radiology: Mildly displaced distal right radial fracture with possible intra-articular extension.  Patient placed in splint which she tolerated well.  We will have her follow-up with hand in 1 week for repeat evaluation and further management.  Return precautions discussed, pt verbalized understanding and is agreeable to plan. Final Clinical Impressions(s) / UC Diagnoses   Final diagnoses:  Closed fracture of right wrist, initial encounter     Discharge Instructions     RICE: rest, ice, compression, elevation as needed for pain.   Cold therapy (ice packs) can be used to help swelling both after injury and after prolonged use of areas of chronic pain/aches.  Pain medication:  350 mg-1000 mg of Tylenol (acetaminophen) and/or 200 mg - 800 mg of Advil (ibuprofen, Motrin) every 8 hours as needed.  May alternate between the two throughout the day as they are generally safe to take together.  DO NOT exceed more than 3000 mg of Tylenol or 3200 mg of ibuprofen in a 24 hour period as this could damage your stomach, kidneys, liver, or increase your bleeding risk.  Important to follow up with specialist(s) below for further evaluation/management if your symptoms persist or worsen.    ED Prescriptions    None     PDMP not reviewed this encounter.   Hall[place], [place], [place] 09/29/19 1356

## 2019-09-29 NOTE — Discharge Instructions (Addendum)
RICE: rest, ice, compression, elevation as needed for pain.   Cold therapy (ice packs) can be used to help swelling both after injury and after prolonged use of areas of chronic pain/aches.  Pain medication:  350 mg-1000 mg of Tylenol (acetaminophen) and/or 200 mg - 800 mg of Advil (ibuprofen, Motrin) every 8 hours as needed.  May alternate between the two throughout the day as they are generally safe to take together.  DO NOT exceed more than 3000 mg of Tylenol or 3200 mg of ibuprofen in a 24 hour period as this could damage your stomach, kidneys, liver, or increase your bleeding risk.   Important to follow up with specialist(s) below for further evaluation/management if your symptoms persist or worsen. 

## 2019-10-02 ENCOUNTER — Telehealth: Payer: Self-pay | Admitting: Physician Assistant

## 2019-10-02 MED ORDER — TRAMADOL HCL 50 MG PO TABS: 50.0000 mg | ORAL_TABLET | Freq: Three times a day (TID) | ORAL | 0 refills | Status: DC | PRN

## 2019-10-02 NOTE — Telephone Encounter (Signed)
Patient seen 3 days ago with mildly displaced distal radial fracture. She has been taking tylenol without good control of pain. Unable to take NSAIDs due to history of kidney transplant. She has not been able to tolerate ice compress due to pain. States has intermittent numbness to the 4th-5th finger. Denies swelling to these finger. States good ROM. Discussed to apply ice to help with swelling, and monitor the numbness/tingling. Will call in tramadol for further pain relief. Strict return precautions given. Otherwise, patient to follow up with hand as scheduled in 3 days. Patient expresses understanding and agrees to plan.

## 2019-10-05 ENCOUNTER — Other Ambulatory Visit (HOSPITAL_COMMUNITY)
Admission: RE | Admit: 2019-10-05 | Discharge: 2019-10-05 | Disposition: A | Discharge status: Home / Self Care | Payer: Medicare Other | Source: Ambulatory Visit | Attending: Orthopedic Surgery | Admitting: Orthopedic Surgery

## 2019-10-05 ENCOUNTER — Encounter (HOSPITAL_BASED_OUTPATIENT_CLINIC_OR_DEPARTMENT_OTHER): Payer: Self-pay | Admitting: Orthopedic Surgery

## 2019-10-05 ENCOUNTER — Other Ambulatory Visit: Payer: Self-pay | Admitting: Orthopedic Surgery

## 2019-10-05 ENCOUNTER — Encounter (HOSPITAL_BASED_OUTPATIENT_CLINIC_OR_DEPARTMENT_OTHER)
Admission: RE | Admit: 2019-10-05 | Discharge: 2019-10-05 | Disposition: A | Discharge status: Home / Self Care | Payer: Medicare Other | Source: Ambulatory Visit | Attending: Orthopedic Surgery | Admitting: Orthopedic Surgery

## 2019-10-05 ENCOUNTER — Other Ambulatory Visit: Payer: Self-pay

## 2019-10-05 DIAGNOSIS — R9431 Abnormal electrocardiogram [ECG] [EKG]: Secondary | ICD-10-CM | POA: Insufficient documentation

## 2019-10-05 DIAGNOSIS — E119 Type 2 diabetes mellitus without complications: Secondary | ICD-10-CM | POA: Diagnosis not present

## 2019-10-05 DIAGNOSIS — Z20822 Contact with and (suspected) exposure to covid-19: Secondary | ICD-10-CM | POA: Insufficient documentation

## 2019-10-05 DIAGNOSIS — I119 Hypertensive heart disease without heart failure: Secondary | ICD-10-CM | POA: Insufficient documentation

## 2019-10-05 DIAGNOSIS — Z01812 Encounter for preprocedural laboratory examination: Secondary | ICD-10-CM | POA: Insufficient documentation

## 2019-10-05 LAB — SARS CORONAVIRUS 2 (TAT 6-24 HRS): SARS Coronavirus 2: NEGATIVE

## 2019-10-05 NOTE — Progress Notes (Signed)
EKG obtained and results reviewed with Dr. Chaney Malling. Although abnormal EKG he states that it is ok to continue surgery at [facility], [place].

## 2019-10-05 NOTE — Progress Notes (Signed)
Chart reviewed by Dr Chaney Malling, OK for [facility], will need stat BMP dos.

## 2019-10-07 ENCOUNTER — Encounter (HOSPITAL_BASED_OUTPATIENT_CLINIC_OR_DEPARTMENT_OTHER): Payer: Self-pay | Admitting: Orthopedic Surgery

## 2019-10-07 ENCOUNTER — Ambulatory Visit (HOSPITAL_BASED_OUTPATIENT_CLINIC_OR_DEPARTMENT_OTHER): Payer: Medicare Other | Admitting: Certified Registered"

## 2019-10-07 ENCOUNTER — Ambulatory Visit (HOSPITAL_BASED_OUTPATIENT_CLINIC_OR_DEPARTMENT_OTHER)
Admission: RE | Admit: 2019-10-07 | Discharge: 2019-10-07 | Disposition: A | Discharge status: Home / Self Care | Payer: Medicare Other | Attending: Orthopedic Surgery | Admitting: Orthopedic Surgery

## 2019-10-07 ENCOUNTER — Encounter (HOSPITAL_BASED_OUTPATIENT_CLINIC_OR_DEPARTMENT_OTHER)
Admission: RE | Disposition: A | Discharge status: Home / Self Care | Payer: Self-pay | Source: Home / Self Care | Attending: Orthopedic Surgery

## 2019-10-07 ENCOUNTER — Other Ambulatory Visit: Payer: Self-pay

## 2019-10-07 DIAGNOSIS — Z888 Allergy status to other drugs, medicaments and biological substances status: Secondary | ICD-10-CM | POA: Insufficient documentation

## 2019-10-07 DIAGNOSIS — Z940 Kidney transplant status: Secondary | ICD-10-CM | POA: Insufficient documentation

## 2019-10-07 DIAGNOSIS — E1140 Type 2 diabetes mellitus with diabetic neuropathy, unspecified: Secondary | ICD-10-CM | POA: Insufficient documentation

## 2019-10-07 DIAGNOSIS — M1990 Unspecified osteoarthritis, unspecified site: Secondary | ICD-10-CM | POA: Diagnosis not present

## 2019-10-07 DIAGNOSIS — I10 Essential (primary) hypertension: Secondary | ICD-10-CM | POA: Insufficient documentation

## 2019-10-07 DIAGNOSIS — S52571A Other intraarticular fracture of lower end of right radius, initial encounter for closed fracture: Secondary | ICD-10-CM | POA: Diagnosis not present

## 2019-10-07 DIAGNOSIS — Z792 Long term (current) use of antibiotics: Secondary | ICD-10-CM | POA: Diagnosis not present

## 2019-10-07 DIAGNOSIS — K219 Gastro-esophageal reflux disease without esophagitis: Secondary | ICD-10-CM | POA: Diagnosis not present

## 2019-10-07 DIAGNOSIS — Z79899 Other long term (current) drug therapy: Secondary | ICD-10-CM | POA: Insufficient documentation

## 2019-10-07 DIAGNOSIS — Z886 Allergy status to analgesic agent status: Secondary | ICD-10-CM | POA: Insufficient documentation

## 2019-10-07 DIAGNOSIS — E039 Hypothyroidism, unspecified: Secondary | ICD-10-CM | POA: Diagnosis not present

## 2019-10-07 DIAGNOSIS — Z86718 Personal history of other venous thrombosis and embolism: Secondary | ICD-10-CM | POA: Insufficient documentation

## 2019-10-07 DIAGNOSIS — Z7984 Long term (current) use of oral hypoglycemic drugs: Secondary | ICD-10-CM | POA: Insufficient documentation

## 2019-10-07 DIAGNOSIS — Z79890 Hormone replacement therapy (postmenopausal): Secondary | ICD-10-CM | POA: Insufficient documentation

## 2019-10-07 DIAGNOSIS — Z7952 Long term (current) use of systemic steroids: Secondary | ICD-10-CM | POA: Insufficient documentation

## 2019-10-07 DIAGNOSIS — K5900 Constipation, unspecified: Secondary | ICD-10-CM | POA: Diagnosis not present

## 2019-10-07 DIAGNOSIS — E7800 Pure hypercholesterolemia, unspecified: Secondary | ICD-10-CM | POA: Diagnosis not present

## 2019-10-07 DIAGNOSIS — F329 Major depressive disorder, single episode, unspecified: Secondary | ICD-10-CM | POA: Diagnosis not present

## 2019-10-07 DIAGNOSIS — W19XXXA Unspecified fall, initial encounter: Secondary | ICD-10-CM | POA: Insufficient documentation

## 2019-10-07 DIAGNOSIS — G4730 Sleep apnea, unspecified: Secondary | ICD-10-CM | POA: Insufficient documentation

## 2019-10-07 DIAGNOSIS — Z7982 Long term (current) use of aspirin: Secondary | ICD-10-CM | POA: Insufficient documentation

## 2019-10-07 HISTORY — DX: Gastro-esophageal reflux disease without esophagitis: K21.9

## 2019-10-07 HISTORY — DX: Unspecified fracture of the lower end of right radius, initial encounter for closed fracture: S52.501A

## 2019-10-07 HISTORY — PX: OPEN REDUCTION INTERNAL FIXATION (ORIF) DISTAL RADIAL FRACTURE: SHX5989

## 2019-10-07 LAB — GLUCOSE, CAPILLARY
Glucose-Capillary: 162 mg/dL — ABNORMAL HIGH (ref 70–99)
Glucose-Capillary: 142 mg/dL — ABNORMAL HIGH (ref 70–99)

## 2019-10-07 SURGERY — OPEN REDUCTION INTERNAL FIXATION (ORIF) DISTAL RADIUS FRACTURE
Anesthesia: Monitor Anesthesia Care | Site: Wrist | Laterality: Right

## 2019-10-07 MED ORDER — 0.9 % SODIUM CHLORIDE (POUR BTL) OPTIME
TOPICAL | Status: DC | PRN
  Administered 2019-10-07: 1000 mL

## 2019-10-07 MED ORDER — ONDANSETRON HCL 4 MG/2ML IJ SOLN
INTRAMUSCULAR | Status: DC | PRN
  Administered 2019-10-07: 4 mg via INTRAVENOUS

## 2019-10-07 MED ORDER — MIDAZOLAM HCL 2 MG/2ML IJ SOLN
2.0000 mg | Freq: Once | INTRAMUSCULAR | Status: AC
  Administered 2019-10-07: 2 mg via INTRAVENOUS

## 2019-10-07 MED ORDER — PROMETHAZINE HCL 25 MG/ML IJ SOLN: 6.2500 mg | INTRAMUSCULAR | Status: DC | PRN

## 2019-10-07 MED ORDER — ROPIVACAINE HCL 5 MG/ML IJ SOLN
INTRAMUSCULAR | Status: DC | PRN
Start: 2019-10-07 — End: 2019-10-07
  Administered 2019-10-07: 30 mL via PERINEURAL

## 2019-10-07 MED ORDER — MIDAZOLAM HCL 2 MG/2ML IJ SOLN
INTRAMUSCULAR | Status: AC
  Filled 2019-10-07: qty 2

## 2019-10-07 MED ORDER — PROPOFOL 500 MG/50ML IV EMUL
INTRAVENOUS | Status: AC
  Filled 2019-10-07: qty 100

## 2019-10-07 MED ORDER — PROPOFOL 500 MG/50ML IV EMUL
INTRAVENOUS | Status: DC | PRN
  Administered 2019-10-07: 75 ug/kg/min via INTRAVENOUS

## 2019-10-07 MED ORDER — PROPOFOL 10 MG/ML IV BOLUS
INTRAVENOUS | Status: AC
  Filled 2019-10-07: qty 20

## 2019-10-07 MED ORDER — CEFAZOLIN SODIUM-DEXTROSE 2-4 GM/100ML-% IV SOLN
INTRAVENOUS | Status: AC
  Filled 2019-10-07: qty 100

## 2019-10-07 MED ORDER — OXYCODONE HCL 5 MG/5ML PO SOLN: 5.0000 mg | Freq: Once | ORAL | Status: DC | PRN

## 2019-10-07 MED ORDER — CEFAZOLIN SODIUM-DEXTROSE 2-4 GM/100ML-% IV SOLN
2.0000 g | INTRAVENOUS | Status: AC
  Administered 2019-10-07: 2 g via INTRAVENOUS

## 2019-10-07 MED ORDER — FENTANYL CITRATE (PF) 100 MCG/2ML IJ SOLN
100.0000 ug | Freq: Once | INTRAMUSCULAR | Status: AC
  Administered 2019-10-07: 100 ug via INTRAVENOUS

## 2019-10-07 MED ORDER — LACTATED RINGERS IV SOLN: INTRAVENOUS | Status: DC

## 2019-10-07 MED ORDER — OXYCODONE-ACETAMINOPHEN 5-325 MG PO TABS: ORAL_TABLET | ORAL | 0 refills | Status: DC

## 2019-10-07 MED ORDER — HYDROMORPHONE HCL 1 MG/ML IJ SOLN: 0.2500 mg | INTRAMUSCULAR | Status: DC | PRN

## 2019-10-07 MED ORDER — FENTANYL CITRATE (PF) 100 MCG/2ML IJ SOLN
INTRAMUSCULAR | Status: AC
  Filled 2019-10-07: qty 2

## 2019-10-07 MED ORDER — ONDANSETRON HCL 4 MG/2ML IJ SOLN
INTRAMUSCULAR | Status: AC
  Filled 2019-10-07: qty 2

## 2019-10-07 MED ORDER — LIDOCAINE HCL (CARDIAC) PF 100 MG/5ML IV SOSY
PREFILLED_SYRINGE | INTRAVENOUS | Status: DC | PRN
  Administered 2019-10-07: 40 mg via INTRAVENOUS

## 2019-10-07 MED ORDER — OXYCODONE HCL 5 MG PO TABS: 5.0000 mg | ORAL_TABLET | Freq: Once | ORAL | Status: DC | PRN

## 2019-10-07 SURGICAL SUPPLY — 62 items
APL PRP STRL LF DISP 70% ISPRP (MISCELLANEOUS) ×1
BIT DRILL 2.0 LNG QUCK RELEASE (BIT) ×1 IMPLANT
BIT DRILL 2.8 QUICK RELEASE (BIT) ×1 IMPLANT
BLADE SURG 15 STRL LF DISP TIS (BLADE) ×2 IMPLANT
BLADE SURG 15 STRL SS (BLADE) ×6
BNDG CMPR 9X4 STRL LF SNTH (GAUZE/BANDAGES/DRESSINGS) ×1
BNDG ELASTIC 3X5.8 VLCR STR LF (GAUZE/BANDAGES/DRESSINGS) ×3 IMPLANT
BNDG ESMARK 4X9 LF (GAUZE/BANDAGES/DRESSINGS) ×3 IMPLANT
BNDG GAUZE ELAST 4 BULKY (GAUZE/BANDAGES/DRESSINGS) ×3 IMPLANT
BNDG PLASTER X FAST 3X3 WHT LF (CAST SUPPLIES) ×30 IMPLANT
BNDG PLSTR 9X3 FST ST WHT (CAST SUPPLIES) ×10
CHLORAPREP W/TINT 26 (MISCELLANEOUS) ×3 IMPLANT
CORD BIPOLAR FORCEPS 12FT (ELECTRODE) ×3 IMPLANT
COVER BACK TABLE 60X90IN (DRAPES) ×3 IMPLANT
COVER MAYO STAND STRL (DRAPES) ×3 IMPLANT
COVER WAND RF STERILE (DRAPES) IMPLANT
CUFF TOURN SGL QUICK 18X4 (TOURNIQUET CUFF) IMPLANT
CUFF TOURN SGL QUICK 24 (TOURNIQUET CUFF)
CUFF TRNQT CYL 24X4X16.5-23 (TOURNIQUET CUFF) IMPLANT
DRAPE EXTREMITY T 121X128X90 (DISPOSABLE) ×3 IMPLANT
DRAPE OEC MINIVIEW 54X84 (DRAPES) ×3 IMPLANT
DRAPE SURG 17X23 STRL (DRAPES) ×3 IMPLANT
DRILL 2.0 LNG QUICK RELEASE (BIT) ×3
DRILL 2.8 QUICK RELEASE (BIT) ×3
EXT HOSE W/PLC CONNECTION (MISCELLANEOUS) ×3
EXTENSION HOSE W/PLC CONNECTON (MISCELLANEOUS) ×1 IMPLANT
GAUZE SPONGE 4X4 12PLY STRL (GAUZE/BANDAGES/DRESSINGS) ×3 IMPLANT
GAUZE XEROFORM 1X8 LF (GAUZE/BANDAGES/DRESSINGS) ×3 IMPLANT
GLOVE BIO SURGEON STRL SZ7.5 (GLOVE) ×3 IMPLANT
GLOVE BIOGEL PI IND STRL 8 (GLOVE) ×1 IMPLANT
GLOVE BIOGEL PI IND STRL 8.5 (GLOVE) IMPLANT
GLOVE BIOGEL PI INDICATOR 8 (GLOVE) ×2
GLOVE BIOGEL PI INDICATOR 8.5 (GLOVE)
GLOVE SURG ORTHO 8.0 STRL STRW (GLOVE) IMPLANT
GOWN STRL REUS W/ TWL LRG LVL3 (GOWN DISPOSABLE) ×1 IMPLANT
GOWN STRL REUS W/TWL LRG LVL3 (GOWN DISPOSABLE) ×3
GOWN STRL REUS W/TWL XL LVL3 (GOWN DISPOSABLE) ×3 IMPLANT
GUIDEWIRE ORTHO 0.054X6 (WIRE) ×9 IMPLANT
NEEDLE HYPO 25X1 1.5 SAFETY (NEEDLE) IMPLANT
NS IRRIG 1000ML POUR BTL (IV SOLUTION) ×3 IMPLANT
PACK BASIN DAY SURGERY FS (CUSTOM PROCEDURE TRAY) ×3 IMPLANT
PAD CAST 3X4 CTTN HI CHSV (CAST SUPPLIES) ×1 IMPLANT
PADDING CAST COTTON 3X4 STRL (CAST SUPPLIES) ×3
PLATE R NARROW PROC VDR (Plate) ×3 IMPLANT
SCREW ACTK 2 NL HEX 3.5.11 (Screw) ×3 IMPLANT
SCREW BN FT 16X2.3XLCK HEX CRT (Screw) ×1 IMPLANT
SCREW CORT FT 18X2.3XLCK HEX (Screw) ×1 IMPLANT
SCREW CORTICAL LOCKING 2.3X16M (Screw) ×6 IMPLANT
SCREW CORTICAL LOCKING 2.3X18M (Screw) ×12 IMPLANT
SCREW FX16X2.3XLCK SMTH NS CRT (Screw) ×1 IMPLANT
SCREW FX18X2.3XSMTH LCK NS CRT (Screw) ×3 IMPLANT
SCREW NON LOCK 3.5X10MM (Screw) ×3 IMPLANT
SCREW NONLOCK HEX 3.5X12 (Screw) ×3 IMPLANT
SLEEVE SCD COMPRESS KNEE MED (MISCELLANEOUS) IMPLANT
SLING ARM FOAM STRAP LRG (SOFTGOODS) ×3 IMPLANT
STOCKINETTE 4X48 STRL (DRAPES) ×3 IMPLANT
SUT ETHILON 4 0 PS 2 18 (SUTURE) ×6 IMPLANT
SUT VICRYL 4-0 PS2 18IN ABS (SUTURE) ×6 IMPLANT
SYR BULB EAR ULCER 3OZ GRN STR (SYRINGE) ×3 IMPLANT
SYR CONTROL 10ML LL (SYRINGE) IMPLANT
TOWEL GREEN STERILE FF (TOWEL DISPOSABLE) ×6 IMPLANT
UNDERPAD 30X36 HEAVY ABSORB (UNDERPADS AND DIAPERS) ×3 IMPLANT

## 2019-10-07 NOTE — Discharge Instructions (Addendum)

## 2019-10-07 NOTE — Op Note (Addendum)
10/07/2019 [facility]  Operative Note  Pre Op Diagnosis: Right comminuted intraarticular distal radius fracture  Post Op Diagnosis: Right comminuted intraarticular distal radius fracture  Procedure:  1. ORIF Right comminuted intraarticular distal radius fracture, 3 intraarticular fragments 2. Right brachioradialis release  Surgeon: Betha Loa, MD  Assistant: Annye Rusk, [facility]  Anesthesia: Regional with sedation  Fluids: Per anesthesia flow sheet  EBL: minimal  Complications: None  Specimen: None  Tourniquet Time:  Total Tourniquet Time Documented: Forearm (Right) - 46 minutes Total: Forearm (Right) - 46 minutes   Disposition: Stable to PACU  INDICATIONS:  Valerie Perez is a 59 y.o. female states she fell one week ago injuring right wrist.  Seen in ED where XR revealed right comminuted intraarticular distal radius fracture with three intraarticular fragments.  Splinted and followed up in office.  We discussed nonoperative and operative treatment options.  She wished to proceed with operative fixation.  Risks, benefits, and alternatives of surgery were discussed including the risk of blood loss; infection; damage to nerves, vessels, tendons, ligaments, bone; failure of surgery; need for additional surgery; complications with wound healing; continued pain; nonunion; malunion; stiffness.  We also discussed the possible need for bone graft and the benefits and risks including the possibility of disease transmission.  She voiced understanding of these risks and elected to proceed.    OPERATIVE COURSE:  After being identified preoperatively by myself, the patient and I agreed upon the procedure and site of procedure.  Surgical site was marked.  The risks, benefits and alternatives of the surgery were reviewed and she wished to proceed.  Surgical consent had been signed.  She was given IV Ancef as preoperative antibiotic prophylaxis.  She was transferred to the  operating room and placed on the operating room table in supine position with the Right upper extremity on an armboard. Sedation was induced by the anesthesiologist.  A regional block had been performed by anesthesia in preoperative holding.  The Right upper extremity was prepped and draped in normal sterile orthopedic fashion.  A surgical pause was performed between the surgeons, anesthesia and operating room staff, and all were in agreement as to the patient, procedure and site of procedure.  Tourniquet at the proximal aspect of the forearm was inflated to 250 mmHg after exsanguination of the limb with an Esmarch bandage.  This was used due to old dialysis fistula in the upper arm.  Standard volar Sherilyn Cooter approach was used.  The bipolar electrocautery was used to obtain hemostasis.  The superficial and deep portions of the FCR tendon sheath were incised, and the FCR and FPL were swept ulnarly to protect the palmar cutaneous branch of the median nerve.  The brachioradialis was released at the radial side of the radius.  The pronator quadratus was released and elevated with the periosteal elevator.  The fracture site was identified and cleared of soft tissue interposition and hematoma.  It was reduced under direct visualization.  An [employer] volar distal radial locking plate was selected.  It was secured to the bone with the guidepins.  C-arm was used in AP and lateral projections to ensure appropriate reduction and position of the hardware and adjustments made as necessary.  Standard AO drilling and measuring technique was used.  A single screw was placed in the slotted hole in the shaft of the plate.  The distal holes were filled with locking pegs with the exception of the styloid holes, which were filled with locking screws.  The  remaining holes in the shaft of the plate were filled with nonlocking screws.  Good purchase was obtained.  C-arm was used in AP, lateral and oblique projections to ensure appropriate  reduction and position of hardware, which was the case.  There was no intra-articular penetration of hardware.  The wound was copiously irrigated with sterile saline.  Pronator quadratus was repaired back over top of the plate using 4-0 Vicryl suture.  Vicryl suture was placed in the subcutaneous tissues in an inverted interrupted fashion and the skin was closed with 4-0 nylon in a horizontal mattress fashion.  There was good pronation and supination of the wrist without crepitance.  The wound was then dressed with sterile Xeroform, 4x4s, and wrapped with a Kerlix bandage.  A volar splint was placed and wrapped with Kerlix and Ace bandage.  Tourniquet was deflated at 46 minutes.  Fingertips were pink with brisk capillary refill after deflation of the tourniquet.  Operative drapes were broken down.  The patient was awoken from anesthesia safely.  She was transferred back to the stretcher and taken to the PACU in stable condition.  I will see her back in the office in one week for postoperative followup.  I will give her a prescription for Percocet 5/325 1-2 tabs PO q6 hours prn pain, dispense # 20.    Betha Loa, MD Electronically signed, 10/07/19

## 2019-10-07 NOTE — Anesthesia Procedure Notes (Signed)
Anesthesia Regional Block: Supraclavicular block   Pre-Anesthetic Checklist: ,, timeout performed, Correct Patient, Correct Site, Correct Laterality, Correct Procedure, Correct Position, site marked, Risks and benefits discussed,  Surgical consent,  Pre-op evaluation,  At surgeon's request and post-op pain management  Laterality: Right  Prep: chloraprep       Needles:  Injection technique: Single-shot  Needle Type: Stimiplex     Needle Length: 9cm  Needle Gauge: 21     Additional Needles:   Procedures:,,,, ultrasound used (permanent image in chart),,,,  Narrative:  Start time: 10/07/2019 12:42 PM End time: 10/07/2019 12:47 PM Injection made incrementally with aspirations every 5 mL.  Performed by: Personally  Anesthesiologist: Lowella Curb, MD

## 2019-10-07 NOTE — H&P (Signed)
Valerie Perez is an 59 y.o. female.   Chief Complaint: right wrist fracture HPI: 59 yo female states she fell 09/29/19 injuring right wrist.  Seen at [facility] where XR revealed distal radius fracture.  Splinted and followed up in office.  She wishes to proceed with operative fixation.  Allergies:  Allergies  Allergen Reactions  . Iron Dextran Shortness Of Breath  . Iron Sucrose Shortness Of Breath  . Atorvastatin Other (See Comments)    Pt experienced severe leg cramping.  . Tape Other (See Comments)    Tears skin Has to use paper tape  . 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    Past Medical History:  Diagnosis Date  . Anemia   . Arthritis    osteoarthritis  . Closed fracture of right distal radius   . Constipation   . Depression   . Diabetes mellitus without complication (HCC)    Type II  . DVT (deep venous thrombosis) (HCC) 02/2016  . Dyspnea    with exertion  . Fatty liver   . GERD (gastroesophageal reflux disease)   . Heart murmur    just recently told she had a heart murmur, but had never heard this before  . High cholesterol   . Hypertension   . Hypothyroid   . Kidney transplant recipient 2019   pt with history of kidney tx x2; current kidney working and no longer on dialysis  . Neuropathy   . Neuropathy   . Pneumonia    09/09/16- "years ago"  . PONV (postoperative nausea and vomiting)   . Renal disorder    dialysis on Tuesday/Thursday/Saturday  . Sleep apnea    uses cpap    Past Surgical History:  Procedure Laterality Date  . ABDOMINAL HYSTERECTOMY    . AV FISTULA PLACEMENT Right 10/03/2015   Procedure: CREATION-RIGHT BRACHIOCEPHALIC ARTERIOVENOUS (AV) FISTULA;  Surgeon: Fransisco Hertz, MD;  Location: [facility] OR;  Service: Vascular;  Laterality: Right;  . CARDIAC CATHETERIZATION N/A 03/14/2016   Procedure: Left Heart Cath and Coronary Angiography;  Surgeon: Yvonne Kendall, MD;  Location: [facility] INVASIVE CV LAB;  Service: Cardiovascular;   Laterality: N/A;  . CHOLECYSTECTOMY  2000  . COLONOSCOPY    . DEBRIDEMENT OF ABDOMINAL WALL ABSCESS  01/14/2016  . EYE SURGERY Bilateral    Cataract  . FISTULA SUPERFICIALIZATION Right 01/30/2016   Procedure: FISTULA SUPERFICIALIZATION RIGHT UPPER ARM;  Surgeon: Fransisco Hertz, MD;  Location: [facility] OR;  Service: Vascular;  Laterality: Right;  . KIDNEY TRANSPLANT  2000  . KIDNEY TRANSPLANT    . REVISON OF ARTERIOVENOUS FISTULA Right 09/10/2016   Procedure: BANDING OF RIGHT ARM BRACHIOCEPHALIC ARTERIOVENOUS FISTULA;  Surgeon: Fransisco Hertz, MD;  Location: [facility] OR;  Service: Vascular;  Laterality: Right;  . UPPER EXTREMITY ANGIOGRAPHY N/A 09/08/2016   Procedure: Upper Extremity Angiography - Right Arm;  Surgeon: Fransisco Hertz, MD;  Location: [facility] INVASIVE CV LAB;  Service: Cardiovascular;  Laterality: N/A;  rt.. arm fistula    Family History: Family History  Problem Relation Age of Onset  . Colon cancer Mother   . Thyroid cancer Father   . Colon cancer Sister   . Hypertension Sister   . Prostate cancer Brother     Social History:   reports that she has never smoked. She has never used smokeless tobacco. She reports that she does not drink alcohol and does not use drugs.  Medications: Medications Prior to Admission  Medication Sig Dispense  Refill  . acetaminophen (TYLENOL) 650 MG CR tablet Take 650 mg by mouth every 8 (eight) hours as needed for pain.    . Adapalene 0.3 % gel Apply topically at bedtime.    Marland Kitchen aspirin EC 81 MG tablet Take 81 mg by mouth daily.     . cloNIDine (CATAPRES) 0.2 MG tablet Take 1 tablet (0.2 mg total) by mouth 3 (three) times daily. 90 tablet 0  . cycloSPORINE modified (GENGRAF) 100 MG capsule Take 200 mg by mouth 2 (two) times daily. TAKE A 25MG  CYCLOSPORIN WITH AM DOSE FOR TOTAL 225MG  IN AM    . cycloSPORINE modified 25 MG CAPS Take by mouth.    . fenofibrate 54 MG tablet Take 54 mg by mouth daily.    gabapentin (NEURONTIN) 100 MG capsule Take 100-300 mg by mouth  at bedtime.    levothyroxine (SYNTHROID, LEVOTHROID) 75 MCG tablet Take 1 tablet (75 mcg total) by mouth daily before breakfast. 30 tablet 0  . loratadine (CLARITIN) 10 MG tablet Take 10 mg by mouth daily.     . magnesium oxide (MAG-OX) 400 MG tablet Take 400 mg by mouth daily.    . metFORMIN (GLUCOPHAGE) 500 MG tablet Take 1,000 mg by mouth 2 (two) times daily with a meal.     . montelukast (SINGULAIR) 10 MG tablet Take 10 mg by mouth at bedtime.     . Naphazoline HCl (CLEAR EYES OP) Apply 1 drop to eye daily as needed (dry eyes).    Marland Kitchen NIFEdipine (ADALAT CC) 90 MG 24 hr tablet Take 90 mg by mouth daily.    Marland Kitchen omeprazole (PRILOSEC) 20 MG capsule Take 20 mg by mouth daily.    . ondansetron (ZOFRAN) 4 MG tablet Take 4 mg by mouth in the morning and at bedtime.    . predniSONE (DELTASONE) 5 MG tablet Take 5 mg by mouth daily with breakfast.    . rosuvastatin (CRESTOR) 20 MG tablet Take 20 mg by mouth daily.    . SENSIPAR 30 MG tablet Take 30 mg by mouth daily.     Marland Kitchen sulfamethoxazole-trimethoprim (BACTRIM) 400-80 MG tablet Take 1 tablet by mouth 3 (three) times a week. Mon, Wed, Fri    . traMADol (ULTRAM) 50 MG tablet Take 1 tablet (50 mg total) by mouth every 8 (eight) hours as needed. 15 tablet 0  . allopurinol (ZYLOPRIM) 100 MG tablet Take 100 mg by mouth daily.    . DULoxetine (CYMBALTA) 60 MG capsule Take 60 mg by mouth at bedtime.    . fluticasone (FLONASE) 50 MCG/ACT nasal spray Place 1 spray into both nostrils daily as needed for allergies.     . K Phos Mono-Sod Phos Di & Mono (PHOSPHA 250 NEUTRAL PO) Take by mouth.    . polyethylene glycol (MIRALAX / GLYCOLAX) packet Take 17 g by mouth daily.    . sodium bicarbonate 650 MG tablet Take 650 mg by mouth 2 (two) times daily.      Results for orders placed or performed during the hospital encounter of 10/07/19 (from the past 48 hour(s))  Glucose, capillary     Status: Abnormal   Collection Time: 10/07/19 11:51 AM  Result Value Ref Range    Glucose-Capillary 162 (H) 70 - 99 mg/dL    Comment: Glucose reference range applies only to samples taken after fasting for at least 8 hours.    No results found.   A comprehensive review of systems was negative.  Blood pressure (!) (  P) 161/76, pulse 71, temperature 98.7 F (37.1 C), temperature source Oral, resp. rate 20, height 5\' 5"  (1.651 m), weight 98.6 kg, SpO2 (P) 100 %.  General appearance: alert, cooperative and appears stated age Head: Normocephalic, without obvious abnormality, atraumatic Neck: supple, symmetrical, trachea midline Cardio: regular rate and rhythm Resp: clear to auscultation bilaterally Extremities: Intact sensation and capillary refill all digits.  +epl/fpl/io.  No wounds.  Pulses: 2+ and symmetric Skin: Skin color, texture, turgor normal. No rashes or lesions Neurologic: Grossly normal Incision/Wound: none  Assessment/Plan Right distal radius fracture.  Plan ORIF right distal radius.  Non operative and operative treatment options have been discussed with the patient and patient wishes to proceed with operative treatment. Risks, benefits, and alternatives of surgery have been discussed and the patient agrees with the plan of care.   10/07/2019, 1:06 PM

## 2019-10-07 NOTE — Transfer of Care (Signed)
Immediate Anesthesia Transfer of Care Note  Patient: Valerie Perez  Procedure(s) Performed: OPEN REDUCTION INTERNAL FIXATION RIGHT DISTAL RADIAL FRACTURE (Right Wrist)  Patient Location: PACU  Anesthesia Type:MAC and Regional  Level of Consciousness: awake, alert  and oriented  Airway & Oxygen Therapy: Patient Spontanous Breathing  Post-op Assessment: Report given to RN and Post -op Vital signs reviewed and stable  Post vital signs: Reviewed and stable  Last Vitals:  Vitals Value Taken Time  BP 132/65 10/07/19 1423  Temp    Pulse 67 10/07/19 1425  Resp 16 10/07/19 1425  SpO2 98 % 10/07/19 1425  Vitals shown include unvalidated device data.  Last Pain:  Vitals:   10/07/19 1138  TempSrc: Oral  PainSc: 10-Worst pain ever      Patients Stated Pain Goal: 3 (98/26/41 5830)  Complications: No complications documented.

## 2019-10-07 NOTE — Progress Notes (Signed)
Assisted Dr. Miller with right, ultrasound guided, supraclavicular block. Side rails up, monitors on throughout procedure. See vital signs in flow sheet. Tolerated Procedure well. 

## 2019-10-07 NOTE — Anesthesia Postprocedure Evaluation (Signed)
Anesthesia Post Note  Patient: Valerie Perez  Procedure(s) Performed: OPEN REDUCTION INTERNAL FIXATION RIGHT DISTAL RADIAL FRACTURE (Right Wrist)     Patient location during evaluation: PACU Anesthesia Type: Regional Level of consciousness: awake and alert Pain management: pain level controlled Vital Signs Assessment: post-procedure vital signs reviewed and stable Respiratory status: spontaneous breathing, nonlabored ventilation and respiratory function stable Cardiovascular status: blood pressure returned to baseline and stable Postop Assessment: no apparent nausea or vomiting Anesthetic complications: no   No complications documented.  Last Vitals:  Vitals:   10/07/19 1445 10/07/19 1500  BP:    Pulse:  83  Resp:  16  Temp:  36.4 C  SpO2: 100% 96%    Last Pain:  Vitals:   10/07/19 1500  TempSrc:   PainSc: 0-No pain                 Lowella Curb

## 2019-10-07 NOTE — Anesthesia Preprocedure Evaluation (Signed)
Anesthesia Evaluation  Patient identified by MRN, date of birth, ID band Patient awake    Reviewed: Allergy & Precautions, NPO status , Patient's Chart, lab work & pertinent test results  History of Anesthesia Complications (+) PONV  Airway Mallampati: II  TM Distance: >3 FB Neck ROM: Full    Dental  (+) Teeth Intact, Dental Advisory Given   Pulmonary sleep apnea ,    breath sounds clear to auscultation       Cardiovascular hypertension, Pt. on medications  Rhythm:Regular Rate:Normal     Neuro/Psych Depression    GI/Hepatic GERD  ,  Endo/Other  diabetes, Type 2Hypothyroidism   Renal/GU Renal InsufficiencyRenal disease     Musculoskeletal  (+) Arthritis , Osteoarthritis,    Abdominal (+) + obese,   Peds  Hematology   Anesthesia Other Findings   Reproductive/Obstetrics                             Anesthesia Physical  Anesthesia Plan  ASA: III  Anesthesia Plan: MAC and Regional   Post-op Pain Management:    Induction: Intravenous  PONV Risk Score and Plan: 3 and Ondansetron, Midazolam, Propofol infusion and Propofol  Airway Management Planned: Simple Face Mask  Additional Equipment:   Intra-op Plan:   Post-operative Plan:   Informed Consent: I have reviewed the patients History and Physical, chart, labs and discussed the procedure including the risks, benefits and alternatives for the proposed anesthesia with the patient or authorized representative who has indicated his/her understanding and acceptance.     Dental advisory given  Plan Discussed with: CRNA and Anesthesiologist  Anesthesia Plan Comments:         Anesthesia Quick Evaluation

## 2019-10-11 ENCOUNTER — Encounter (HOSPITAL_BASED_OUTPATIENT_CLINIC_OR_DEPARTMENT_OTHER): Payer: Self-pay | Admitting: Orthopedic Surgery

## 2020-04-24 ENCOUNTER — Telehealth: Payer: Self-pay | Admitting: Internal Medicine

## 2020-04-24 NOTE — Telephone Encounter (Signed)
Called and spoke with Patient.  Patient requested a OV for cpap supplies.  Patient scheduled with Tammy, NP 04/27/20 at 1130.  Office address given to Patient. Nothing further at this time.

## 2020-04-27 ENCOUNTER — Ambulatory Visit (INDEPENDENT_AMBULATORY_CARE_PROVIDER_SITE_OTHER): Payer: Medicare Other | Admitting: Adult Health

## 2020-04-27 ENCOUNTER — Other Ambulatory Visit: Payer: Self-pay

## 2020-04-27 ENCOUNTER — Encounter: Payer: Self-pay | Admitting: Adult Health

## 2020-04-27 VITALS — BP 136/84 | HR 63 | Temp 97.2°F | Ht 65.0 in | Wt 225.0 lb

## 2020-04-27 DIAGNOSIS — E669 Obesity, unspecified: Secondary | ICD-10-CM | POA: Diagnosis not present

## 2020-04-27 DIAGNOSIS — G4733 Obstructive sleep apnea (adult) (pediatric): Secondary | ICD-10-CM

## 2020-04-27 NOTE — Assessment & Plan Note (Signed)
Excellent compliance on CPAP.  CPAP download is been requested. Trial of the DreamWear nasal mask.  Plan  Patient Instructions  Continue on CPAP at bedtime Keep up the good work Work on healthy weight Do not drive if sleepy Order for new supplies. - trial of dream wear nasal (if does not work , then could try dream wear full face) .  Bring SD card back for download after you get and are on it for 1 month.  Follow-up in 1 year with Dr. Maple Hudson and as needed

## 2020-04-27 NOTE — Progress Notes (Signed)
@Patient  ID: , female    DOB: 1960/06/23, 60 y.o.   MRN: [id number]  Chief Complaint  Patient presents with  . Follow-up    Referring provider: Nadra, Hritz *  HPI: 60 year old female never smoker followed for obstructive sleep apnea Medical history significant for end-stage renal disease on dialysis, s/p Kidney transplant 05/2017  previous right popliteal DVT, previous critical illness 2018 with cardiac arrest, shock, respiratory failure  TEST/EVENTS :  In PSG April 25, 2005 AHI 51.8/hour, desaturation 73%, CPAP titration 12 cm H2O.  Body weight 220 pounds  04/27/2020 Follow up ; OSA  Patient presents for a follow-up visit for sleep apnea.  She was last seen in the office in March 2019.  Patient is followed for severe obstructive sleep apnea.  She is on nocturnal CPAP. Patient says she is doing well on CPAP.  She wears her CPAP each night 8hrs each night .  Feels rested. Gets tired some . April 2019 Feels that she benefits from CPAP.  Unable to get CPAP download. No SD card. Machine is 62 years old.  Wants to try a new mask as her mask is old and causes irritation on her nose.  We looked at several different mask.  She would like to try the DreamWear nasal mask.  Since last visit patient says she had a kidney transplant in 2019.  She is feeling well.  Feels so much better since not doing dialysis.  She has received a fourth Covid booster.  . Had Evusheld last month .    Allergies  Allergen Reactions  . Iron Dextran Shortness Of Breath  . Iron Sucrose Shortness Of Breath  . Atorvastatin Other (See Comments)    Pt experienced severe leg cramping.  . Tape Other (See Comments)    Tears skin Has to use paper tape  . 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    Immunization History  Administered Date(s) Administered  . Influenza Split 11/24/2016  . Influenza Whole 02/13/2010  . Influenza,inj,Quad PF,6-35 Mos 12/04/2017, 11/11/2018,  11/28/2019  . Influenza-Unspecified 11/22/2014, 11/27/2014, 11/09/2015  . PFIZER(Purple Top)SARS-COV-2 Vaccination 05/05/2019, 05/26/2019, 10/25/2019    Past Medical History:  Diagnosis Date  . Anemia   . Arthritis    osteoarthritis  . Closed fracture of right distal radius   . Constipation   . Depression   . Diabetes mellitus without complication (HCC)    Type II  . DVT (deep venous thrombosis) (HCC) 02/2016  . Dyspnea    with exertion  . Fatty liver   . GERD (gastroesophageal reflux disease)   . Heart murmur    just recently told she had a heart murmur, but had never heard this before  . High cholesterol   . Hypertension   . Hypothyroid   . Kidney transplant recipient 2019   pt with history of kidney tx x2; current kidney working and no longer on dialysis  . Neuropathy   . Neuropathy   . Pneumonia    09/09/16- "years ago"  . PONV (postoperative nausea and vomiting)   . Renal disorder    dialysis on Tuesday/Thursday/Saturday  . Sleep apnea    uses cpap    Tobacco History: Social History   Tobacco Use  Smoking Status Never Smoker  Smokeless Tobacco Never Used   Counseling given: Not Answered   Outpatient Medications Prior to Visit  Medication Sig Dispense Refill  . Adapalene 0.3 % gel Apply topically at bedtime.    09/11/16  allopurinol (ZYLOPRIM) 100 MG tablet Take 100 mg by mouth daily.    Marland Kitchen aspirin EC 81 MG tablet Take 81 mg by mouth daily.     . cloNIDine (CATAPRES) 0.2 MG tablet Take 1 tablet (0.2 mg total) by mouth 3 (three) times daily. 90 tablet 0  . cycloSPORINE modified (GENGRAF) 100 MG capsule Take 200 mg by mouth 2 (two) times daily. TAKE A 25MG  CYCLOSPORIN WITH AM DOSE FOR TOTAL 225MG  IN AM    . cycloSPORINE modified 25 MG CAPS Take by mouth.    . DULoxetine (CYMBALTA) 60 MG capsule Take 60 mg by mouth at bedtime.    . fenofibrate 54 MG tablet Take 54 mg by mouth daily.    . fluticasone (FLONASE) 50 MCG/ACT nasal spray Place 1 spray into both nostrils  daily as needed for allergies.     gabapentin (NEURONTIN) 100 MG capsule Take 100-300 mg by mouth at bedtime.    levothyroxine (SYNTHROID, LEVOTHROID) 75 MCG tablet Take 1 tablet (75 mcg total) by mouth daily before breakfast. 30 tablet 0  . loratadine (CLARITIN) 10 MG tablet Take 10 mg by mouth daily.     . magnesium oxide (MAG-OX) 400 MG tablet Take 400 mg by mouth daily.    . metFORMIN (GLUCOPHAGE) 500 MG tablet Take 1,000 mg by mouth 2 (two) times daily with a meal.     . montelukast (SINGULAIR) 10 MG tablet Take 10 mg by mouth at bedtime.     . Naphazoline HCl (CLEAR EYES OP) Apply 1 drop to eye daily as needed (dry eyes).    Marland Kitchen NIFEdipine (ADALAT CC) 90 MG 24 hr tablet Take 90 mg by mouth daily.    Marland Kitchen omeprazole (PRILOSEC) 20 MG capsule Take 20 mg by mouth daily.    . ondansetron (ZOFRAN) 4 MG tablet Take 4 mg by mouth in the morning and at bedtime.    Marland Kitchen oxyCODONE-acetaminophen (PERCOCET) 5-325 MG tablet 1-2 tabs po q6 hours prn pain 20 tablet 0  . polyethylene glycol (MIRALAX / GLYCOLAX) packet Take 17 g by mouth daily.    . predniSONE (DELTASONE) 5 MG tablet Take 5 mg by mouth daily with breakfast.    . rosuvastatin (CRESTOR) 20 MG tablet Take 20 mg by mouth daily.    . SENSIPAR 30 MG tablet Take 30 mg by mouth daily.     Marland Kitchen sulfamethoxazole-trimethoprim (BACTRIM) 400-80 MG tablet Take 1 tablet by mouth 3 (three) times a week. Mon, Wed, Fri    . K Phos Mono-Sod Phos Di & Mono (PHOSPHA 250 NEUTRAL PO) Take by mouth. (Patient not taking: Reported on 04/27/2020)    . sodium bicarbonate 650 MG tablet Take 650 mg by mouth 2 (two) times daily. (Patient not taking: Reported on 04/27/2020)     No facility-administered medications prior to visit.     Review of Systems:   Constitutional:   No  weight loss, night sweats,  Fevers, chills, fatigue, or  lassitude.  HEENT:   No headaches,  Difficulty swallowing,  Tooth/dental problems, or  Sore throat,                No sneezing, itching, ear  ache, nasal congestion, post nasal drip,   CV:  No chest pain,  Orthopnea, PND, swelling in lower extremities, anasarca, dizziness, palpitations, syncope.   GI  No heartburn, indigestion, abdominal pain, nausea, vomiting, diarrhea, change in bowel habits, loss of appetite, bloody stools.   Resp: No shortness of breath with  exertion or at rest.  No excess mucus, no productive cough,  No non-productive cough,  No coughing up of blood.  No change in color of mucus.  No wheezing.  No chest wall deformity  Skin: no rash or lesions.  GU: no dysuria, change in color of urine, no urgency or frequency.  No flank pain, no hematuria   MS:  No joint pain or swelling.  No decreased range of motion.  No back pain.    Physical Exam  BP 136/84 (BP Location: Left Arm, Cuff Size: Large)   Pulse 63   Temp (!) 97.2 F (36.2 C)   Ht 5\' 5"  (1.651 m)   Wt 225 lb (102.1 kg)   SpO2 94%   BMI 37.44 kg/m   GEN: A/Ox3; pleasant , NAD, well nourished    HEENT:  [place]/AT,   , NOSE-clear, THROAT-clear, no lesions, no postnasal drip or exudate noted.   NECK:  Supple w/ fair ROM; no JVD; normal carotid impulses w/o bruits; no thyromegaly or nodules palpated; no lymphadenopathy.    RESP  Clear  P & A; w/o, wheezes/ rales/ or rhonchi. no accessory muscle use, no dullness to percussion  CARD:  RRR, no m/r/g, no peripheral edema, pulses intact, no cyanosis or clubbing.  GI:   Soft & nt; nml bowel sounds; no organomegaly or masses detected.   Musco: Warm bil, no deformities or joint swelling noted.   Neuro: alert, no focal deficits noted.    Skin: Warm, no lesions or rashes    Lab Results:  CBC  BNP No results found for: BNP  ProBNP No results found for: PROBNP  Imaging: No results found.    No flowsheet data found.  No results found for: NITRICOXIDE      Assessment & Plan:   Obstructive sleep apnea Excellent compliance on CPAP.  CPAP download is been requested. Trial of the  DreamWear nasal mask.  Plan  Patient Instructions  Continue on CPAP at bedtime Keep up the good work Work on healthy weight Do not drive if sleepy Order for new supplies. - trial of dream wear nasal (if does not work , then could try dream wear full face) .  Bring SD card back for download after you get and are on it for 1 month.  Follow-up in 1 year with Dr. and as needed      Obesity (BMI 35.0-39.9 without comorbidity) Healthy weight loss discussed     01-24-1990, NP 04/27/2020

## 2020-04-27 NOTE — Patient Instructions (Addendum)
Continue on CPAP at bedtime Keep up the good work Work on [employer] Do not drive if sleepy Order for new supplies. - trial of dream wear nasal (if does not work , then could try dream wear full face) .  Bring SD card back for download after you get and are on it for 1 month.  Follow-up in 1 year with Dr. Maple Hudson and as needed

## 2020-04-27 NOTE — Assessment & Plan Note (Signed)
Healthy weight loss discussed 

## 2021-04-25 ENCOUNTER — Encounter: Payer: Self-pay | Admitting: Internal Medicine

## 2021-04-26 NOTE — Progress Notes (Signed)
HPI ?61 year old female never smoker followed for OSA complicated by ESRD/dialysis, right popliteal DVT, history cardiac arrest/shock/acute hypoxic hypercapnic respiratory failure 2018 ?NPSG 04/25/2005- AHI 51.8/ hr, desaturation to  73%, CPAP titrated to 12, body weight 220 lbs ? ?------------------------------------------------------------------------ ? ? ?04/27/17- 61 year old female never smoker followed for OSA complicated by ESRD/dialysis, right popliteal DVT, history cardiac arrest/shock/acute hypoxic hypercapnic respiratory failure 2018 ?CPAP auto 5-15/[facility] ?----OSA: DME: [facility]. Wears CPAP for at least 7-7.5 hours each night. No new supplies needed at this time,  ?Sleep here in the mornings for the past 3 or 4 weeks with no change in sleep pattern medications or other reasons she identifies.  Little waking at night.  No sleep medicines. ?Remains comfortable with CPAP auto 5-15.  Machine new in the past year.  She is satisfied she continues to benefit. ? ?04/29/21- 60 year old female never smoker followed for OSA complicated by ESRD/dialysis/Transplant, right popliteal DVT, history cardiac arrest/shock/acute hypoxic hypercapnic respiratory failure 2018, [facility],  ?CPAP auto 5-15/[facility]/ [facility] ?Download-compliance 97.8%, AHI 1.5/ hr ?Body weight today-223 lbs ?Covid vax- ?Flu vax- ?-----57yr f/u for OSA. States she has been more tired recently. Denies any issues with the mask fit.  ?Has noted more fatigue over past 6 months.  Wakes tired.  Worse in the last 2 weeks.  Tosses and turns at night.  Has regular follow-up of her thyroid status.  Avoids caffeine because she does not like coffee. ?Download reviewed suggesting no problem with CPAP use or control.  I do not recognize any of her medications as clearly likely to cause daytime sedation. ?She is going to try caffeine tablets for effect.  She needs to pay more attention to nighttime sleep quality. ? ?ROS-see HPI   + =  positive ?Constitutional:    weight loss, night sweats, fevers, chills, +fatigue, lassitude. ?HEENT:    headaches, difficulty swallowing, tooth/dental problems, sore throat,  ?     sneezing, itching, ear ache, nasal congestion, post nasal drip, snoring ?CV:    chest pain, orthopnea, PND, swelling in lower extremities, anasarca,                                  ?                   dizziness, palpitations ?Resp:  + shortness of breath with exertion or at rest.   ?             productive cough,   non-productive cough, coughing up of blood.   ?           change in color of mucus.  wheezing.   ?Skin:    rash or lesions. ?GI:  No-   heartburn, indigestion, abdominal pain, nausea, vomiting, diarrhea,  ?               change in bowel habits, loss of appetite ?GU: dysuria, change in color of urine, no urgency or frequency.   flank pain. ?MS:   joint pain, stiffness, decreased range of motion, back pain. ?Neuro-     nothing unusual ?Psych:  change in mood or affect.  depression or anxiety.   memory loss. ? ?OBJ- Physical Exam      ?General- Alert, Oriented, Affect-appropriate, Distress- none acute, +obese ?Skin- rash-none, lesions- none, excoriation- none ?Lymphadenopathy- none ?Head- atraumatic ?           Eyes- Gross  vision intact, PERRLA, conjunctivae and secretions clear ?           Ears- Hearing, canals-normal ?           Nose- Clear, no-Septal dev, mucus, polyps, erosion, perforation  ?           Throat- Mallampati IV , mucosa clear , drainage- none, tonsils-present ?Neck- flexible , trachea midline, no stridor , thyroid nl, carotid no bruit ?Chest - symmetrical excursion , unlabored ?          Heart/CV- RRR ,  Murmur+ 1/6 LLSB , no gallop  , no rub, nl s1 s2 ?                          - JVD- none , edema- none, stasis changes- none, varices- none ?          Lung- clear to P&A, wheeze- none, cough- none , dullness-none, rub- none ?          Chest wall-  ?Abd-  ?Br/ Gen/ Rectal- Not done, not indicated ?Extrem- cyanosis-  none, clubbing, none, atrophy- none, strength- nl. +HD access R upper arm ?Neuro- grossly intact to observation ? ? ? ?

## 2021-04-29 ENCOUNTER — Encounter: Payer: Self-pay | Admitting: Internal Medicine

## 2021-04-29 ENCOUNTER — Other Ambulatory Visit: Payer: Self-pay

## 2021-04-29 ENCOUNTER — Ambulatory Visit: Payer: Medicare Other | Admitting: Internal Medicine

## 2021-04-29 VITALS — BP 122/74 | HR 72 | Ht 65.0 in | Wt 223.0 lb

## 2021-04-29 DIAGNOSIS — E669 Obesity, unspecified: Secondary | ICD-10-CM

## 2021-04-29 DIAGNOSIS — G4733 Obstructive sleep apnea (adult) (pediatric): Secondary | ICD-10-CM

## 2021-04-29 NOTE — Patient Instructions (Signed)
Order- DME [facility]/ [facility]- please install SD card or AirView and provide current compliance and control download. Continue auto 5-15, mask of choice, supplies. ? ?We suggested you try otc caffeine tablet (NoDoz, Vivarin)  try 1/2 or 1 tab, once or twice daily as needed. See if it helps your fatigue. ? ?Please call if we can help ?

## 2021-05-01 NOTE — Assessment & Plan Note (Signed)
Encourage work for healthy weight ?

## 2021-05-01 NOTE — Assessment & Plan Note (Signed)
Benefits from CPAP with good compliance and control.  Suspect sleep quality or depression may be contributing to her sense of increased fatigue, assuming thyroid function is controlled. ?Plan-try caffeine tablet for effect, pay attention to nighttime sleep habits and sleep quality for discussion.  Continue CPAP auto 5-15 ?

## 2021-11-23 NOTE — Progress Notes (Unsigned)
HPI 61 year old female never smoker followed for OSA complicated by ESRD/dialysis, right popliteal DVT, history cardiac arrest/shock/acute hypoxic hypercapnic respiratory failure 2018 NPSG 04/25/2005- AHI 51.8/ hr, desaturation to  73%, CPAP titrated to 12, body weight 220 lbs  ------------------------------------------------------------------------   04/29/21- 61 year old female never smoker followed for OSA complicated by ESRD/dialysis/Transplant, right popliteal DVT, history cardiac arrest/shock/acute hypoxic hypercapnic respiratory failure 2018, [facility],  CPAP auto 5-15/[facility]/ [facility] Download-compliance 97.8%, AHI 1.5/ hr Body weight today-223 lbs Covid vax- Flu vax- -----50yr f/u for OSA. States she has been more tired recently. Denies any issues with the mask fit.  Has noted more fatigue over past 6 months.  Wakes tired.  Worse in the last 2 weeks.  Tosses and turns at night.  Has regular follow-up of her thyroid status.  Avoids caffeine because she does not like coffee. Download reviewed suggesting no problem with CPAP use or control.  I do not recognize any of her medications as clearly likely to cause daytime sedation. She is going to try caffeine tablets for effect.  She needs to pay more attention to nighttime sleep quality.  910/2/23- 61 year old female never smoker followed for OSA complicated by ESRD/dialysis/Transplant, right popliteal DVT, history cardiac arrest/shock/acute hypoxic hypercapnic respiratory failure 2018, [facility],  CPAP auto 5-15/[facility]/ [facility] Download-compliance  Body weight today- Covid vax- Flu vax-    ROS-see HPI   + = positive Constitutional:    weight loss, night sweats, fevers, chills, +fatigue, lassitude. HEENT:    headaches, difficulty swallowing, tooth/dental problems, sore throat,       sneezing, itching, ear ache, nasal congestion, post nasal drip, snoring CV:    chest pain, orthopnea, PND, swelling in lower extremities,  anasarca,                                                     dizziness, palpitations Resp:  + shortness of breath with exertion or at rest.                productive cough,   non-productive cough, coughing up of blood.              change in color of mucus.  wheezing.   Skin:    rash or lesions. GI:  No-   heartburn, indigestion, abdominal pain, nausea, vomiting, diarrhea,                 change in bowel habits, loss of appetite GU: dysuria, change in color of urine, no urgency or frequency.   flank pain. MS:   joint pain, stiffness, decreased range of motion, back pain. Neuro-     nothing unusual Psych:  change in mood or affect.  depression or anxiety.   memory loss.  OBJ- Physical Exam      General- Alert, Oriented, Affect-appropriate, Distress- none acute, +obese Skin- rash-none, lesions- none, excoriation- none Lymphadenopathy- none Head- atraumatic            Eyes- Gross vision intact, PERRLA, conjunctivae and secretions clear            Ears- Hearing, canals-normal            Nose- Clear, no-Septal dev, mucus, polyps, erosion, perforation             Throat- Mallampati IV , mucosa clear , drainage- none, tonsils-present  Neck- flexible , trachea midline, no stridor , thyroid nl, carotid no bruit Chest - symmetrical excursion , unlabored           Heart/CV- RRR ,  Murmur+ 1/6 LLSB , no gallop  , no rub, nl s1 s2                           - JVD- none , edema- none, stasis changes- none, varices- none           Lung- clear to P&A, wheeze- none, cough- none , dullness-none, rub- none           Chest wall-  Abd-  Br/ Gen/ Rectal- Not done, not indicated Extrem- cyanosis- none, clubbing, none, atrophy- none, strength- nl. +HD access R upper arm Neuro- grossly intact to observation

## 2021-11-25 ENCOUNTER — Ambulatory Visit: Payer: Medicare Other | Admitting: Internal Medicine

## 2021-11-25 ENCOUNTER — Encounter: Payer: Self-pay | Admitting: Internal Medicine

## 2021-11-25 VITALS — BP 128/76 | HR 86 | Ht 65.0 in | Wt 214.4 lb

## 2021-11-25 DIAGNOSIS — Z2911 Encounter for prophylactic immunotherapy for respiratory syncytial virus (RSV): Secondary | ICD-10-CM | POA: Diagnosis not present

## 2021-11-25 DIAGNOSIS — G4733 Obstructive sleep apnea (adult) (pediatric): Secondary | ICD-10-CM

## 2021-11-25 DIAGNOSIS — Z23 Encounter for immunization: Secondary | ICD-10-CM

## 2021-11-25 DIAGNOSIS — E669 Obesity, unspecified: Secondary | ICD-10-CM | POA: Diagnosis not present

## 2021-11-25 DIAGNOSIS — G4710 Hypersomnia, unspecified: Secondary | ICD-10-CM | POA: Diagnosis not present

## 2021-11-25 MED ORDER — AMPHETAMINE-DEXTROAMPHETAMINE 10 MG PO TABS: ORAL_TABLET | ORAL | 0 refills | Status: DC

## 2021-11-25 NOTE — Patient Instructions (Addendum)
Order- Flu vax- standard  Order- RSV vax  Order- DME [facility]- continue CPAP auto 5-15, mask of choice, humidifier, supplies, AirView/ card  Script sent to try Adderall 10 mg for Alertness as needed. You can one in the morning and another no later than early afternoon, if needed.

## 2021-11-25 NOTE — Assessment & Plan Note (Signed)
Doesn't seem to reflect inadequate CPAP or poor sleep hygiene. TSH has been checked. Plan- try adderall once or twice daily. Use and controlled status discussed.

## 2021-11-25 NOTE — Addendum Note (Signed)
Addended by: Priscille Kluver on: 11/25/2021 03:15 PM   Modules accepted: Orders

## 2021-11-25 NOTE — Assessment & Plan Note (Signed)
Benefits from CPAP with good compliance and control Plan- continue auto 5-15, renew supplies, etc.

## 2021-11-25 NOTE — Assessment & Plan Note (Signed)
Not changing lifestyle

## 2022-01-04 NOTE — Progress Notes (Signed)
HPI 61 year old female never smoker followed for OSA complicated by ESRD/dialysis, right popliteal DVT, history cardiac arrest/shock/acute hypoxic hypercapnic respiratory failure 2018 NPSG 04/25/2005- AHI 51.8/ hr, desaturation to  73%, CPAP titrated to 12, body weight 220 lbs  ------------------------------------------------------------------------  11/25/21- - 61 year old female never smoker followed for OSA complicated by ESRD/dialysis/Transplant, right popliteal DVT, history cardiac arrest/shock/acute hypoxic hypercapnic respiratory failure 2018, [facility],  CPAP auto 5-15/[facility]/ [facility] Download-compliance 99%, AHI 1.6/ hr Body weight today-214 lbs Covid vax-4 Phizer Flu vax - today standard RSV vax- today discussed Complains she stays tired every day. Sleeps soundly. NoDoz didn't help. Not much of a caffeine drinker. Discussed trial of Adderall.  01/06/22- 61 year old female never smoker followed for OSA complicated by ESRD/dialysis/Transplant, right popliteal DVT, history cardiac arrest/shock/acute hypoxic hypercapnic respiratory failure 2018, [facility],  -Adderall 10 mg bid,  CPAP auto 5-15/[facility]/ [facility] Download-compliance Body weight today 216 lbs Covid vax-4 Phizer Flu vax - had Has been trying Adderall since last here, for fatigue.  She says Adderall has made a very big difference in her quality of life.  She has more energy and feels better.  [place]. Download reviewed. Doing well with CPAP   ROS-see HPI   + = positive Constitutional:    weight loss, night sweats, fevers, chills, +fatigue, lassitude. HEENT:    headaches, difficulty swallowing, tooth/dental problems, sore throat,       sneezing, itching, ear ache, nasal congestion, post nasal drip, snoring CV:    chest pain, orthopnea, PND, swelling in lower extremities, anasarca,                                                     dizziness,  palpitations Resp:  + shortness of breath with exertion or at rest.                productive cough,   non-productive cough, coughing up of blood.              change in color of mucus.  wheezing.   Skin:    rash or lesions. GI:  No-   heartburn, indigestion, abdominal pain, nausea, vomiting, diarrhea,                 change in bowel habits, loss of appetite GU: dysuria, change in color of urine, no urgency or frequency.   flank pain. MS:   joint pain, stiffness, decreased range of motion, back pain. Neuro-     nothing unusual Psych:  change in mood or affect.  depression or anxiety.   memory loss.  OBJ- Physical Exam      General- Alert, Oriented, Affect-appropriate, Distress- none acute, +obese Skin- rash-none, lesions- none, excoriation- none Lymphadenopathy- none Head- atraumatic            Eyes- Gross vision intact, PERRLA, conjunctivae and secretions clear            Ears- Hearing, canals-normal            Nose- Clear, no-Septal dev, mucus, polyps, erosion, perforation             Throat- Mallampati IV , mucosa clear , drainage- none, tonsils-present Neck- flexible , trachea midline, no stridor , thyroid nl, carotid no bruit Chest - symmetrical excursion ,  unlabored           Heart/CV- RRR ,  Murmur+ 1/6 LLSB , no gallop  , no rub, nl s1 s2                           - JVD- none , edema- none, stasis changes- none, varices- none           Lung- clear to P&A, wheeze- none, cough- none , dullness-none, rub- none           Chest wall-  Abd-  Br/ Gen/ Rectal- Not done, not indicated Extrem-  +HD access R upper arm pulsatile Neuro- grossly intact to observation

## 2022-01-06 ENCOUNTER — Ambulatory Visit (INDEPENDENT_AMBULATORY_CARE_PROVIDER_SITE_OTHER): Payer: Medicare Other | Admitting: Internal Medicine

## 2022-01-06 ENCOUNTER — Encounter: Payer: Self-pay | Admitting: Internal Medicine

## 2022-01-06 VITALS — BP 130/78 | HR 99 | Ht 65.0 in | Wt 216.2 lb

## 2022-01-06 DIAGNOSIS — G4710 Hypersomnia, unspecified: Secondary | ICD-10-CM

## 2022-01-06 DIAGNOSIS — G4733 Obstructive sleep apnea (adult) (pediatric): Secondary | ICD-10-CM

## 2022-01-06 MED ORDER — AMPHETAMINE-DEXTROAMPHETAMINE 10 MG PO TABS: ORAL_TABLET | ORAL | 0 refills | Status: DC

## 2022-01-06 NOTE — Patient Instructions (Signed)
Adderall refilled- glad it helps  We can continue CPAP

## 2022-01-22 ENCOUNTER — Encounter: Payer: Self-pay | Admitting: Allergy and Immunology

## 2022-01-22 ENCOUNTER — Ambulatory Visit: Payer: Medicare Other | Admitting: Allergy and Immunology

## 2022-01-22 VITALS — BP 146/84 | HR 71 | Resp 16 | Ht 65.0 in | Wt 213.0 lb

## 2022-01-22 DIAGNOSIS — J3089 Other allergic rhinitis: Secondary | ICD-10-CM | POA: Diagnosis not present

## 2022-01-22 DIAGNOSIS — J324 Chronic pansinusitis: Secondary | ICD-10-CM

## 2022-01-22 DIAGNOSIS — K219 Gastro-esophageal reflux disease without esophagitis: Secondary | ICD-10-CM

## 2022-01-22 MED ORDER — FAMOTIDINE 40 MG PO TABS: 40.0000 mg | ORAL_TABLET | Freq: Every evening | ORAL | 5 refills | Status: DC

## 2022-01-22 MED ORDER — OMEPRAZOLE 40 MG PO CPDR: 40.0000 mg | DELAYED_RELEASE_CAPSULE | Freq: Every morning | ORAL | 5 refills | Status: DC

## 2022-01-22 MED ORDER — RYALTRIS 665-25 MCG/ACT NA SUSP: NASAL | 5 refills | Status: DC

## 2022-01-22 NOTE — Progress Notes (Unsigned)
Valerie Perez - [place] - [place] - [place] - [place]   Dear Valerie Perez,  Thank you for referring Valerie Perez to the [facility] [place] on 01/22/2022.   Below is a summation of this patient's evaluation and recommendations.  Thank you for your referral. I will keep you informed about this patient's response to treatment.   If you have any questions please do not hesitate to contact me.   Sincerely,  Valerie Priest, MD Allergy / Immunology [facility]   ______________________________________________________________________    NEW PATIENT NOTE  Referring Provider: Selena Perez * Primary Provider: Hadley Pen, MD Date of office visit: 01/22/2022    Subjective:   Chief Complaint:  Valerie Perez (DOB: August 17, 1960) is a 61 y.o. female who presents to the clinic on 01/22/2022 with a chief complaint of Allergies .     HPI: Valerie Perez presents to this clinic in evaluation of persistent respiratory tract symptoms.  She states that for many years she has had problems with her respiratory tract but has become quite bad over the course of the past 6 months.  She complains of ear itching and feeling as though her hearing is in a drum and having positional vertigo when she lays down and turns over and then sits up, and her nose is runny and she does sneeze and she has sinus pressure in her cheeks and her forehead and her eyes water and they burn and she has lots of postnasal drip.  She does not have any associated anosmia or bad headaches or ugly nasal discharge.  She does have a lots of drainage in her throat that goes into her stomach when and when she wakes up in the morning she is nauseated from this issue.  She does have reflux disease and she is taking some medications for this issue which apparently resulted in resolution of all of her classic reflux symptoms.  She is using  CPAP for sleep apnea since 2011.  She is immunosuppressed for kidney transplant performed 2000 and 2019.  She has received this years flu vaccine, RSV vaccine, and COVID-vaccine.  Past Medical History:  Diagnosis Date   Anemia    Arthritis    osteoarthritis   Closed fracture of right distal radius    Constipation    Depression    Diabetes mellitus without complication (HCC)    Type II   DVT (deep venous thrombosis) (HCC) 02/2016   Dyspnea    with exertion   Fatty liver    GERD (gastroesophageal reflux disease)    Heart murmur    just recently told she had a heart murmur, but had never heard this before   High cholesterol    Hypertension    Hypothyroid    Kidney transplant recipient 2019   pt with history of kidney tx x2; current kidney working and no longer on dialysis   Neuropathy    Neuropathy    Pneumonia    09/09/16- "years ago"   PONV (postoperative nausea and vomiting)    Renal disorder    dialysis on Tuesday/Thursday/Saturday   Sleep apnea    uses cpap    Past Surgical History:  Procedure Laterality Date   ABDOMINAL HYSTERECTOMY     AV FISTULA PLACEMENT Right 10/03/2015   Procedure: CREATION-RIGHT BRACHIOCEPHALIC ARTERIOVENOUS (AV) FISTULA;  Surgeon: Valerie Hertz, MD;  Location: [facility] OR;  Service: Vascular;  Laterality: Right;   CARDIAC  CATHETERIZATION N/A 03/14/2016   Procedure: Left Heart Cath and Coronary Angiography;  Surgeon: Valerie Kendallhristopher End, MD;  [facility];  Service: Cardiovascular;  Laterality: N/A;   CHOLECYSTECTOMY  2000   COLONOSCOPY     DEBRIDEMENT OF ABDOMINAL WALL ABSCESS  01/14/2016   EYE SURGERY Bilateral    Cataract   FISTULA SUPERFICIALIZATION Right 01/30/2016   Procedure: FISTULA SUPERFICIALIZATION RIGHT UPPER ARM;  Surgeon: Valerie HertzBrian L Chen, MD;  Location: [facility] OR;  Service: Vascular;  Laterality: Right;   KIDNEY TRANSPLANT  2000   KIDNEY TRANSPLANT     OPEN REDUCTION INTERNAL FIXATION (ORIF) DISTAL RADIAL FRACTURE Right 10/07/2019    Procedure: OPEN REDUCTION INTERNAL FIXATION RIGHT DISTAL RADIAL FRACTURE;  Surgeon: Valerie Perez, Kevin, MD;  Location: [facility];  Service: Orthopedics;  Laterality: Right;   REVISON OF ARTERIOVENOUS FISTULA Right 09/10/2016   Procedure: BANDING OF RIGHT ARM BRACHIOCEPHALIC ARTERIOVENOUS FISTULA;  Surgeon: Valerie Perez, Valerie L, MD;  [facility];  Service: Vascular;  Laterality: Right;   UPPER EXTREMITY ANGIOGRAPHY N/A 09/08/2016   Procedure: Upper Extremity Angiography - Right Arm;  Surgeon: Valerie Perez, Valerie L, MD;  [facility];  Service: Cardiovascular;  Laterality: N/A;  rt.. arm fistula    Allergies as of 01/22/2022       Reactions   Iron Dextran Shortness Of Breath   Iron Sucrose Shortness Of Breath   Atorvastatin Other (See Comments)   Pt experienced severe leg cramping.   Tape Other (See Comments)   Tears skin Has to use paper tape   Aspirin Other (See Comments)   KIDNEY transplant-Pt takes Baby ASA        Medication List    amphetamine-dextroamphetamine 10 MG tablet Commonly known as: Adderall 1 or 2 tabs daily as needed for alertness   aspirin EC 81 MG tablet Take 81 mg by mouth daily.   azaTHIOprine 50 MG tablet Commonly known as: IMURAN   CLEAR EYES OP Apply 1 drop to eye daily as needed (dry eyes).   cloNIDine 0.2 MG tablet Commonly known as: CATAPRES Take 1 tablet (0.2 mg total) by mouth 3 (three) times daily.   DULoxetine 60 MG capsule Commonly known as: CYMBALTA Take 60 mg by mouth at bedtime.   estradiol 0.5 MG tablet Commonly known as: ESTRACE Take 0.5 mg by mouth daily.   fenofibrate 54 MG tablet Take 54 mg by mouth daily.   fluticasone 50 MCG/ACT nasal spray Commonly known as: FLONASE Place 1 spray into both nostrils daily as needed for allergies.   furosemide 20 MG tablet Commonly known as: LASIX Take 20 mg by mouth 2 (two) times daily.   isosorbide mononitrate 30 MG 24 hr tablet Commonly known as: IMDUR Take 1  tablet by mouth daily.   levothyroxine 75 MCG tablet Commonly known as: SYNTHROID Take 1 tablet (75 mcg total) by mouth daily before breakfast.   lisinopril 5 MG tablet Commonly known as: ZESTRIL Take 1 tablet by mouth daily.   loratadine 10 MG tablet Commonly known as: CLARITIN Take 10 mg by mouth daily.   magnesium oxide 400 MG tablet Commonly known as: MAG-OX Take 400 mg by mouth daily.   meclizine 25 MG tablet Commonly known as: ANTIVERT   metFORMIN 500 MG tablet Commonly known as: GLUCOPHAGE Take 1,000 mg by mouth 2 (two) times daily with a meal.   montelukast 10 MG tablet Commonly known as: SINGULAIR Take 10 mg by mouth at bedtime.   NIFEdipine 90 MG  24 hr tablet Commonly known as: ADALAT CC Take 90 mg by mouth daily.   omeprazole 20 MG capsule Commonly known as: PRILOSEC Take 20 mg by mouth daily.   ondansetron 4 MG tablet Commonly known as: ZOFRAN Take 4 mg by mouth in the morning and at bedtime.   PHOSPHA 250 NEUTRAL PO Take by mouth.   polyethylene glycol 17 g packet Commonly known as: MIRALAX / GLYCOLAX Take 17 g by mouth daily.   pravastatin 20 MG tablet Commonly known as: PRAVACHOL Take 1 tablet by mouth daily.   predniSONE 5 MG tablet Commonly known as: DELTASONE Take 5 mg by mouth daily with breakfast.   sodium bicarbonate 650 MG tablet Take 650 mg by mouth 2 (two) times daily.   sulfamethoxazole-trimethoprim 400-80 MG tablet Commonly known as: BACTRIM Take 1 tablet by mouth 3 (three) times a week. Mon, Wed, Fri   Vitamin D (Ergocalciferol) 1.25 MG (50000 UNIT) Caps capsule Commonly known as: DRISDOL Take 50,000 Units by mouth once a week.        Review of systems negative except as noted in HPI / PMHx or noted below:  Review of Systems  Constitutional: Negative.   HENT: Negative.    Eyes: Negative.   Respiratory: Negative.    Cardiovascular: Negative.   Gastrointestinal: Negative.   Genitourinary: Negative.    Musculoskeletal: Negative.   Skin: Negative.   Neurological: Negative.   Endo/Heme/Allergies: Negative.   Psychiatric/Behavioral: Negative.      Family History  Problem Relation Age of Onset   Colon cancer Mother    Thyroid cancer Father    Colon cancer Sister    Hypertension Sister    Prostate cancer Brother     Social History   Socioeconomic History   Marital status: Married    Spouse name: Kathlene November   Number of children: 2   Years of education: Some college   Highest education level: Not on file  Occupational History   Occupation: Disabled  Tobacco Use   Smoking status: Never   Smokeless tobacco: Never  Vaping Use   Vaping Use: Never used  Substance and Sexual Activity   Alcohol use: No   Drug use: No   Sexual activity: Not on file  Other Topics Concern   Not on file  Social History Narrative   Lives with husband   Caffeine use: Tea sometimes   Left handed    Social Determinants of Health   Financial Resource Strain: Not on file  Food Insecurity: Not on file  Transportation Needs: Not on file  Physical Activity: Not on file  Stress: Not on file  Social Connections: Not on file  Intimate Partner Violence: Not on file    Environmental and Social history  Lives in a house with a dry environment, no animals located inside the household, no carpet in the bedroom, no plastic on the bed, no plastic on the pillow, and no smoking ongoing with inside the household.  Objective:   Vitals:   01/22/22 1419  BP: (!) 146/84  Pulse: 71  Resp: 16  SpO2: 98%   Height: 5\' 5"  (165.1 cm) Weight: 213 lb (96.6 kg)  Physical Exam Constitutional:      Appearance: She is not diaphoretic.  HENT:     Head: Normocephalic.     Right Ear: Tympanic membrane, ear canal and external ear normal.     Left Ear: Tympanic membrane, ear canal and external ear normal.     Nose: Nose normal. No  mucosal edema or rhinorrhea.     Mouth/Throat:     Pharynx: Uvula midline. No  oropharyngeal exudate.  Eyes:     Conjunctiva/sclera: Conjunctivae normal.  Neck:     Thyroid: No thyromegaly.     Trachea: Trachea normal. No tracheal tenderness or tracheal deviation.  Cardiovascular:     Rate and Rhythm: Normal rate and regular rhythm.     Heart sounds: S1 normal and S2 normal. Murmur (Systolic) heard.  Pulmonary:     Effort: No respiratory distress.     Breath sounds: Normal breath sounds. No stridor. No wheezing or rales.  Lymphadenopathy:     Head:     Right side of head: No tonsillar adenopathy.     Left side of head: No tonsillar adenopathy.     Cervical: No cervical adenopathy.  Skin:    Findings: No erythema or rash.     Nails: There is no clubbing.  Neurological:     Mental Status: She is alert.     Diagnostics: Allergy skin tests were performed.   Spirometry was performed and demonstrated an FEV1 of *** @ *** % of predicted. FEV1/FVC = ***  The patient had an Asthma Control Test with the following results:  .     Assessment and Plan:    1. Perennial allergic rhinitis   2. Chronic pansinusitis   3. LPRD (laryngopharyngeal reflux disease)     Patient Instructions   1.  Allergen avoidance measures  2.  Treat and prevent inflammation of airway:   A. Montelukast 10 mg - 1 tablet 1 time per day  B. Ryaltris - 2 sprays each nostril 1-2 times per day  3.  Treat and prevent reflux/LPR:   A. Increase omeprrazole 40 mg - 1 tablet in AM  B, Start famotidine 40 mg - 1 tablet in PM  4.  If needed:   A. Nasal saline  B. Loratadine 10 mg - 1 tablet 1 time per day  5.  Obtain sinus CT scan for chronic sinusitis  6.  Return to clinic in 4 weeks or earlier if problem   Valerie Priest, MD Allergy / Immunology [facility] [place]

## 2022-01-22 NOTE — Patient Instructions (Addendum)
  1.  Allergen avoidance measures???  2.  Treat and prevent inflammation of airway:   A. Montelukast 10 mg - 1 tablet 1 time per day  B. Ryaltris - 2 sprays each nostril 1-2 times per day  3.  Treat and prevent reflux/LPR:   A. Increase omeprazole 40 mg - 1 tablet in AM  B, Start famotidine 40 mg - 1 tablet in PM  4.  If needed:   A. Nasal saline  B. Loratadine 10 mg - 1 tablet 1 time per day  5.  Obtain sinus CT scan for chronic sinusitis  6.  Return to clinic in 4 weeks or earlier if problem

## 2022-01-23 ENCOUNTER — Encounter: Payer: Self-pay | Admitting: Allergy and Immunology

## 2022-01-23 NOTE — Assessment & Plan Note (Signed)
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 Fortunately adderall is helping. Plan- refill Adderall. Med talk done.

## 2022-01-23 NOTE — Assessment & Plan Note (Signed)
Benefits from CPAP with good compliance and control Plan- continue auto 5-15 

## 2022-02-05 ENCOUNTER — Telehealth: Payer: Self-pay

## 2022-02-05 NOTE — Telephone Encounter (Signed)
Patient has been scheduled for sinus CT at the [facility].  Appointment date: 02/10/2022  Appointment time: check in at 10:00 am for a 10:30 appointment  Per insurance no PA required  Patient was informed and verbalized understanding.

## 2022-02-11 ENCOUNTER — Other Ambulatory Visit: Payer: Self-pay

## 2022-02-11 MED ORDER — AMOXICILLIN-POT CLAVULANATE 875-125 MG PO TABS: 1.0000 | ORAL_TABLET | Freq: Two times a day (BID) | ORAL | 0 refills | Status: AC

## 2022-02-20 ENCOUNTER — Ambulatory Visit: Payer: Medicare Other | Admitting: Allergy and Immunology

## 2022-03-03 ENCOUNTER — Encounter: Payer: Self-pay | Admitting: *Deleted

## 2022-03-13 ENCOUNTER — Ambulatory Visit: Payer: Medicare Other | Admitting: Allergy and Immunology

## 2022-09-25 DEATH — deceased

## 2023-01-08 ENCOUNTER — Ambulatory Visit: Payer: Medicare Other | Admitting: Internal Medicine
